# Patient Record
Sex: Male | Born: 1950 | Race: White | Hispanic: No | Marital: Married | Smoking: Former smoker
Health system: Southern US, Community
[De-identification: ages and names within clinical notes are randomized; demographics above are authoritative.]

## PROBLEM LIST (undated history)

## (undated) DIAGNOSIS — K5903 Drug induced constipation: Secondary | ICD-10-CM

## (undated) DIAGNOSIS — G062 Extradural and subdural abscess, unspecified: Secondary | ICD-10-CM

## (undated) DIAGNOSIS — R030 Elevated blood-pressure reading, without diagnosis of hypertension: Principal | ICD-10-CM

## (undated) DIAGNOSIS — M4640 Discitis, unspecified, site unspecified: Secondary | ICD-10-CM

## (undated) DIAGNOSIS — F32A Depression, unspecified: Secondary | ICD-10-CM

## (undated) DIAGNOSIS — M25511 Pain in right shoulder: Secondary | ICD-10-CM

## (undated) DIAGNOSIS — R0789 Other chest pain: Secondary | ICD-10-CM

## (undated) DIAGNOSIS — F329 Major depressive disorder, single episode, unspecified: Secondary | ICD-10-CM

## (undated) DIAGNOSIS — M25512 Pain in left shoulder: Secondary | ICD-10-CM

## (undated) DIAGNOSIS — F1010 Alcohol abuse, uncomplicated: Secondary | ICD-10-CM

## (undated) DIAGNOSIS — D696 Thrombocytopenia, unspecified: Secondary | ICD-10-CM

## (undated) DIAGNOSIS — I10 Essential (primary) hypertension: Secondary | ICD-10-CM

## (undated) DIAGNOSIS — K7290 Hepatic failure, unspecified without coma: Secondary | ICD-10-CM

## (undated) DIAGNOSIS — F419 Anxiety disorder, unspecified: Secondary | ICD-10-CM

## (undated) DIAGNOSIS — IMO0001 Reserved for inherently not codable concepts without codable children: Secondary | ICD-10-CM

## (undated) DIAGNOSIS — J449 Chronic obstructive pulmonary disease, unspecified: Secondary | ICD-10-CM

## (undated) DIAGNOSIS — K7460 Unspecified cirrhosis of liver: Secondary | ICD-10-CM

## (undated) DIAGNOSIS — M869 Osteomyelitis, unspecified: Secondary | ICD-10-CM

## (undated) DIAGNOSIS — Z8619 Personal history of other infectious and parasitic diseases: Secondary | ICD-10-CM

## (undated) DIAGNOSIS — B1920 Unspecified viral hepatitis C without hepatic coma: Secondary | ICD-10-CM

## (undated) DIAGNOSIS — M009 Pyogenic arthritis, unspecified: Secondary | ICD-10-CM

## (undated) DIAGNOSIS — D649 Anemia, unspecified: Secondary | ICD-10-CM

## (undated) DIAGNOSIS — R7881 Bacteremia: Secondary | ICD-10-CM

## (undated) HISTORY — DX: Extradural and subdural abscess, unspecified: G06.2

## (undated) HISTORY — DX: Drug induced constipation: K59.03

## (undated) HISTORY — DX: Thrombocytopenia, unspecified: D69.6

## (undated) HISTORY — DX: Pyogenic arthritis, unspecified: M00.9

## (undated) HISTORY — DX: Personal history of other infectious and parasitic diseases: Z86.19

## (undated) HISTORY — DX: Depression, unspecified: F32.A

## (undated) HISTORY — DX: Anemia, unspecified: D64.9

## (undated) HISTORY — DX: Osteomyelitis, unspecified: M86.9

## (undated) HISTORY — DX: Pain in left shoulder: M25.512

## (undated) HISTORY — DX: Discitis, unspecified, site unspecified: M46.40

## (undated) HISTORY — DX: Other chest pain: R07.89

## (undated) HISTORY — DX: Elevated blood-pressure reading, without diagnosis of hypertension: R03.0

## (undated) HISTORY — DX: Unspecified cirrhosis of liver: K74.60

## (undated) HISTORY — DX: Anxiety disorder, unspecified: F41.9

## (undated) HISTORY — DX: Reserved for inherently not codable concepts without codable children: IMO0001

## (undated) HISTORY — DX: Bacteremia: R78.81

## (undated) HISTORY — DX: Pain in right shoulder: M25.511

## (undated) HISTORY — DX: Alcohol abuse, uncomplicated: F10.10

## (undated) HISTORY — DX: Unspecified viral hepatitis C without hepatic coma: B19.20

## (undated) HISTORY — DX: Major depressive disorder, single episode, unspecified: F32.9

## (undated) HISTORY — DX: Hepatic failure, unspecified without coma: K72.90

---

## 2010-02-12 DIAGNOSIS — R7881 Bacteremia: Secondary | ICD-10-CM

## 2010-02-12 HISTORY — DX: Bacteremia: R78.81

## 2010-03-06 ENCOUNTER — Emergency Department (HOSPITAL_COMMUNITY): Admission: EM | Admit: 2010-03-06 | Discharge: 2010-03-07 | Payer: Self-pay | Admitting: Emergency Medicine

## 2010-03-07 ENCOUNTER — Ambulatory Visit: Payer: Self-pay | Admitting: Internal Medicine

## 2010-03-07 ENCOUNTER — Inpatient Hospital Stay (HOSPITAL_COMMUNITY): Admission: EM | Admit: 2010-03-07 | Discharge: 2010-03-30 | Payer: Self-pay | Admitting: Emergency Medicine

## 2010-03-07 ENCOUNTER — Ambulatory Visit: Payer: Self-pay | Admitting: Cardiovascular Disease

## 2010-03-07 DIAGNOSIS — R7881 Bacteremia: Secondary | ICD-10-CM | POA: Insufficient documentation

## 2010-03-08 ENCOUNTER — Encounter
Admission: RE | Admit: 2010-03-08 | Discharge: 2010-03-10 | Payer: Self-pay | Source: Home / Self Care | Admitting: Internal Medicine

## 2010-03-10 ENCOUNTER — Encounter: Payer: Self-pay | Admitting: Infectious Disease

## 2010-03-12 ENCOUNTER — Encounter: Payer: Self-pay | Admitting: Internal Medicine

## 2010-03-14 ENCOUNTER — Encounter: Payer: Self-pay | Admitting: Internal Medicine

## 2010-03-15 ENCOUNTER — Encounter: Payer: Self-pay | Admitting: Internal Medicine

## 2010-03-15 DIAGNOSIS — K7290 Hepatic failure, unspecified without coma: Secondary | ICD-10-CM

## 2010-03-15 DIAGNOSIS — K7682 Hepatic encephalopathy: Secondary | ICD-10-CM

## 2010-03-15 HISTORY — DX: Hepatic failure, unspecified without coma: K72.90

## 2010-03-15 HISTORY — DX: Hepatic encephalopathy: K76.82

## 2010-03-21 ENCOUNTER — Encounter: Payer: Self-pay | Admitting: Internal Medicine

## 2010-03-23 ENCOUNTER — Encounter: Payer: Self-pay | Admitting: Internal Medicine

## 2010-03-23 DIAGNOSIS — F1021 Alcohol dependence, in remission: Secondary | ICD-10-CM

## 2010-03-23 DIAGNOSIS — K7460 Unspecified cirrhosis of liver: Secondary | ICD-10-CM | POA: Insufficient documentation

## 2010-03-23 DIAGNOSIS — B182 Chronic viral hepatitis C: Secondary | ICD-10-CM | POA: Insufficient documentation

## 2010-03-23 DIAGNOSIS — E8779 Other fluid overload: Secondary | ICD-10-CM | POA: Insufficient documentation

## 2010-03-23 DIAGNOSIS — J690 Pneumonitis due to inhalation of food and vomit: Secondary | ICD-10-CM | POA: Insufficient documentation

## 2010-03-23 DIAGNOSIS — Z87448 Personal history of other diseases of urinary system: Secondary | ICD-10-CM

## 2010-03-23 DIAGNOSIS — J309 Allergic rhinitis, unspecified: Secondary | ICD-10-CM | POA: Insufficient documentation

## 2010-03-23 DIAGNOSIS — K7290 Hepatic failure, unspecified without coma: Secondary | ICD-10-CM | POA: Insufficient documentation

## 2010-03-23 DIAGNOSIS — D638 Anemia in other chronic diseases classified elsewhere: Secondary | ICD-10-CM | POA: Insufficient documentation

## 2010-03-23 DIAGNOSIS — K5909 Other constipation: Secondary | ICD-10-CM | POA: Insufficient documentation

## 2010-04-07 ENCOUNTER — Encounter: Payer: Self-pay | Admitting: Internal Medicine

## 2010-04-13 ENCOUNTER — Telehealth (INDEPENDENT_AMBULATORY_CARE_PROVIDER_SITE_OTHER): Payer: Self-pay | Admitting: *Deleted

## 2010-04-18 ENCOUNTER — Ambulatory Visit: Payer: Self-pay | Admitting: Internal Medicine

## 2010-04-18 DIAGNOSIS — R030 Elevated blood-pressure reading, without diagnosis of hypertension: Secondary | ICD-10-CM | POA: Insufficient documentation

## 2010-04-18 DIAGNOSIS — Z862 Personal history of diseases of the blood and blood-forming organs and certain disorders involving the immune mechanism: Secondary | ICD-10-CM | POA: Insufficient documentation

## 2010-04-18 DIAGNOSIS — M25519 Pain in unspecified shoulder: Secondary | ICD-10-CM | POA: Insufficient documentation

## 2010-04-18 DIAGNOSIS — M2550 Pain in unspecified joint: Secondary | ICD-10-CM | POA: Insufficient documentation

## 2010-04-18 LAB — CONVERTED CEMR LAB
ALT: 30 units/L (ref 0–53)
Albumin: 3.2 g/dL — ABNORMAL LOW (ref 3.5–5.2)
Alkaline Phosphatase: 33 units/L — ABNORMAL LOW (ref 39–117)
Basophils Absolute: 0 10*3/uL (ref 0.0–0.1)
CO2: 21 meq/L (ref 19–32)
Calcium: 9 mg/dL (ref 8.4–10.5)
Chloride: 105 meq/L (ref 96–112)
Creatinine, Ser: 0.84 mg/dL (ref 0.40–1.50)
Eosinophils Absolute: 0 10*3/uL (ref 0.0–0.7)
Glucose, Bld: 115 mg/dL — ABNORMAL HIGH (ref 70–99)
HCT: 33.1 % — ABNORMAL LOW (ref 39.0–52.0)
Hemoglobin: 11.2 g/dL — ABNORMAL LOW (ref 13.0–17.0)
MCV: 90.4 fL (ref >=78.0)
Monocytes Relative: 8 % (ref 3–12)
Platelets: 132 10*3/uL — ABNORMAL LOW (ref 150–400)
Potassium: 3.7 meq/L (ref 3.5–5.3)
RDW: 14.7 % (ref 11.5–15.5)
Sodium: 137 meq/L (ref 135–145)
WBC: 7.5 10*3/uL (ref 4.0–10.5)

## 2010-04-19 ENCOUNTER — Telehealth: Payer: Self-pay | Admitting: *Deleted

## 2010-04-20 ENCOUNTER — Telehealth: Payer: Self-pay | Admitting: *Deleted

## 2010-04-24 ENCOUNTER — Ambulatory Visit: Payer: Self-pay | Admitting: Internal Medicine

## 2010-04-25 LAB — CONVERTED CEMR LAB
Creatinine,U: 240.7 mg/dL
Methadone: NEGATIVE
Opiates: POSITIVE — AB
Phencyclidine (PCP): NEGATIVE
Propoxyphene: NEGATIVE

## 2010-04-27 ENCOUNTER — Inpatient Hospital Stay (HOSPITAL_COMMUNITY): Admission: EM | Admit: 2010-04-27 | Discharge: 2010-05-18 | Payer: Self-pay | Admitting: Emergency Medicine

## 2010-04-27 ENCOUNTER — Encounter: Payer: Self-pay | Admitting: Ophthalmology

## 2010-04-27 ENCOUNTER — Ambulatory Visit: Payer: Self-pay | Admitting: Internal Medicine

## 2010-04-27 DIAGNOSIS — G062 Extradural and subdural abscess, unspecified: Secondary | ICD-10-CM | POA: Insufficient documentation

## 2010-04-27 DIAGNOSIS — M519 Unspecified thoracic, thoracolumbar and lumbosacral intervertebral disc disorder: Secondary | ICD-10-CM

## 2010-04-28 ENCOUNTER — Ambulatory Visit: Payer: Self-pay | Admitting: Infectious Diseases

## 2010-05-02 ENCOUNTER — Encounter: Payer: Self-pay | Admitting: Internal Medicine

## 2010-05-15 DIAGNOSIS — A0472 Enterocolitis due to Clostridium difficile, not specified as recurrent: Secondary | ICD-10-CM

## 2010-05-18 ENCOUNTER — Encounter: Payer: Self-pay | Admitting: Internal Medicine

## 2010-05-30 ENCOUNTER — Encounter: Payer: Self-pay | Admitting: Internal Medicine

## 2010-05-31 ENCOUNTER — Ambulatory Visit: Payer: Self-pay | Admitting: Internal Medicine

## 2010-05-31 DIAGNOSIS — R55 Syncope and collapse: Secondary | ICD-10-CM

## 2010-05-31 DIAGNOSIS — F411 Generalized anxiety disorder: Secondary | ICD-10-CM | POA: Insufficient documentation

## 2010-06-16 ENCOUNTER — Encounter: Payer: Self-pay | Admitting: Internal Medicine

## 2010-06-16 ENCOUNTER — Telehealth: Payer: Self-pay | Admitting: Internal Medicine

## 2010-07-06 ENCOUNTER — Telehealth: Payer: Self-pay | Admitting: Internal Medicine

## 2010-07-06 ENCOUNTER — Encounter: Payer: Self-pay | Admitting: Internal Medicine

## 2010-07-12 ENCOUNTER — Ambulatory Visit: Payer: Self-pay | Admitting: Internal Medicine

## 2010-07-13 LAB — CONVERTED CEMR LAB
Amphetamine Screen, Ur: NEGATIVE
Barbiturate Quant, Ur: NEGATIVE
Creatinine,U: 201.5 mg/dL
Opiates: POSITIVE — AB
Phencyclidine (PCP): NEGATIVE
Propoxyphene: NEGATIVE

## 2010-07-17 ENCOUNTER — Encounter: Payer: Self-pay | Admitting: Internal Medicine

## 2010-07-17 ENCOUNTER — Telehealth: Payer: Self-pay | Admitting: *Deleted

## 2010-07-20 ENCOUNTER — Encounter: Payer: Self-pay | Admitting: Infectious Disease

## 2010-07-20 ENCOUNTER — Ambulatory Visit: Payer: Self-pay | Admitting: Internal Medicine

## 2010-07-20 ENCOUNTER — Encounter: Payer: Self-pay | Admitting: Internal Medicine

## 2010-07-20 ENCOUNTER — Telehealth: Payer: Self-pay | Admitting: *Deleted

## 2010-07-20 LAB — CONVERTED CEMR LAB
CO2: 28 meq/L (ref 19–32)
Chloride: 103 meq/L (ref 96–112)
Creatinine, Ser: 0.79 mg/dL (ref 0.40–1.50)
Glucose, Bld: 96 mg/dL (ref 70–99)
Sodium: 140 meq/L (ref 135–145)

## 2010-07-25 ENCOUNTER — Ambulatory Visit (HOSPITAL_COMMUNITY): Admission: RE | Admit: 2010-07-25 | Discharge: 2010-07-25 | Payer: Self-pay | Admitting: Internal Medicine

## 2010-07-31 ENCOUNTER — Telehealth: Payer: Self-pay | Admitting: Infectious Disease

## 2010-07-31 DIAGNOSIS — M009 Pyogenic arthritis, unspecified: Secondary | ICD-10-CM | POA: Insufficient documentation

## 2010-08-03 ENCOUNTER — Telehealth: Payer: Self-pay | Admitting: Internal Medicine

## 2010-08-04 ENCOUNTER — Ambulatory Visit (HOSPITAL_COMMUNITY): Admission: RE | Admit: 2010-08-04 | Discharge: 2010-08-04 | Payer: Self-pay | Admitting: Infectious Disease

## 2010-08-07 ENCOUNTER — Telehealth (INDEPENDENT_AMBULATORY_CARE_PROVIDER_SITE_OTHER): Payer: Self-pay | Admitting: *Deleted

## 2010-08-09 ENCOUNTER — Telehealth: Payer: Self-pay | Admitting: Infectious Disease

## 2010-08-10 ENCOUNTER — Ambulatory Visit: Payer: Self-pay | Admitting: Internal Medicine

## 2010-08-10 ENCOUNTER — Encounter: Payer: Self-pay | Admitting: Internal Medicine

## 2010-08-10 ENCOUNTER — Inpatient Hospital Stay (HOSPITAL_COMMUNITY): Admission: EM | Admit: 2010-08-10 | Discharge: 2010-08-13 | Payer: Self-pay | Admitting: Emergency Medicine

## 2010-08-11 ENCOUNTER — Ambulatory Visit: Payer: Self-pay | Admitting: Infectious Diseases

## 2010-08-13 ENCOUNTER — Encounter: Payer: Self-pay | Admitting: Internal Medicine

## 2010-08-15 ENCOUNTER — Encounter: Payer: Self-pay | Admitting: Infectious Disease

## 2010-08-16 ENCOUNTER — Telehealth: Payer: Self-pay | Admitting: Internal Medicine

## 2010-08-21 ENCOUNTER — Encounter: Payer: Self-pay | Admitting: Internal Medicine

## 2010-08-23 ENCOUNTER — Telehealth: Payer: Self-pay | Admitting: Internal Medicine

## 2010-08-23 ENCOUNTER — Encounter: Payer: Self-pay | Admitting: Internal Medicine

## 2010-08-24 ENCOUNTER — Ambulatory Visit: Payer: Self-pay | Admitting: Infectious Diseases

## 2010-08-24 ENCOUNTER — Encounter: Payer: Self-pay | Admitting: Internal Medicine

## 2010-08-24 ENCOUNTER — Inpatient Hospital Stay (HOSPITAL_COMMUNITY): Admission: EM | Admit: 2010-08-24 | Discharge: 2010-08-25 | Payer: Self-pay | Admitting: Emergency Medicine

## 2010-09-04 ENCOUNTER — Encounter: Payer: Self-pay | Admitting: Internal Medicine

## 2010-09-05 ENCOUNTER — Encounter: Payer: Self-pay | Admitting: Infectious Disease

## 2010-09-06 ENCOUNTER — Ambulatory Visit: Payer: Self-pay | Admitting: Infectious Disease

## 2010-09-11 ENCOUNTER — Telehealth: Payer: Self-pay | Admitting: Internal Medicine

## 2010-09-14 ENCOUNTER — Ambulatory Visit: Payer: Self-pay | Admitting: Internal Medicine

## 2010-09-14 DIAGNOSIS — F329 Major depressive disorder, single episode, unspecified: Secondary | ICD-10-CM

## 2010-09-15 ENCOUNTER — Encounter: Payer: Self-pay | Admitting: Internal Medicine

## 2010-09-29 ENCOUNTER — Encounter: Payer: Self-pay | Admitting: Infectious Diseases

## 2010-10-04 ENCOUNTER — Encounter: Payer: Self-pay | Admitting: Infectious Diseases

## 2010-10-10 ENCOUNTER — Encounter: Payer: Self-pay | Admitting: Internal Medicine

## 2010-10-10 ENCOUNTER — Telehealth: Payer: Self-pay | Admitting: Internal Medicine

## 2010-10-18 ENCOUNTER — Ambulatory Visit: Admit: 2010-10-18 | Payer: Self-pay

## 2010-10-27 ENCOUNTER — Encounter: Payer: Self-pay | Admitting: Infectious Disease

## 2010-11-07 ENCOUNTER — Telehealth: Payer: Self-pay | Admitting: Internal Medicine

## 2010-11-10 ENCOUNTER — Telehealth: Payer: Self-pay | Admitting: Internal Medicine

## 2010-11-13 ENCOUNTER — Telehealth: Payer: Self-pay | Admitting: Internal Medicine

## 2010-11-13 ENCOUNTER — Encounter: Payer: Self-pay | Admitting: Infectious Disease

## 2010-11-13 ENCOUNTER — Ambulatory Visit
Admission: RE | Admit: 2010-11-13 | Discharge: 2010-11-13 | Payer: Self-pay | Source: Home / Self Care | Attending: Infectious Disease | Admitting: Infectious Disease

## 2010-11-13 LAB — CONVERTED CEMR LAB
AST: 68 units/L — ABNORMAL HIGH (ref 0–37)
Albumin: 3.2 g/dL — ABNORMAL LOW (ref 3.5–5.2)
Alkaline Phosphatase: 29 units/L — ABNORMAL LOW (ref 39–117)
BUN: 7 mg/dL (ref 6–23)
Basophils Absolute: 0 10*3/uL (ref 0.0–0.1)
Basophils Relative: 1 % (ref 0–1)
Creatinine, Ser: 0.67 mg/dL (ref 0.40–1.50)
Eosinophils Absolute: 0.1 10*3/uL (ref 0.0–0.7)
Eosinophils Relative: 2 % (ref 0–5)
HCT: 34.3 % — ABNORMAL LOW (ref 39.0–52.0)
Hemoglobin: 11.4 g/dL — ABNORMAL LOW (ref 13.0–17.0)
MCHC: 33.2 g/dL (ref 30.0–36.0)
MCV: 91.2 fL (ref 78.0–100.0)
Monocytes Absolute: 0.3 10*3/uL (ref 0.1–1.0)
Monocytes Relative: 8 % (ref 3–12)
Potassium: 3.4 meq/L — ABNORMAL LOW (ref 3.5–5.3)
RDW: 15.3 % (ref 11.5–15.5)
Total Bilirubin: 1.3 mg/dL — ABNORMAL HIGH (ref 0.3–1.2)

## 2010-11-14 NOTE — Medication Information (Signed)
Summary: Alex Henry   Imported By: Margie Billet 07/11/2010 11:25:54  _____________________________________________________________________  External Attachment:    Type:   Image     Comment:   External Document

## 2010-11-14 NOTE — Progress Notes (Signed)
Summary: Refill/gh  Phone Note Refill Request Message from:  Fax from Pharmacy on April 13, 2010 12:15 PM  Refills Requested: Medication #1:  HYDROCODONE-ACETAMINOPHEN 5-500 MG TABS Take one tablet by mouth every 6 hours as needed for pain   Last Refilled: 03/30/2010 Appointment scheduled for 04/18/2010.  Hospital discharge 03/30/2010   Method Requested: Electronic Initial call taken by: Angelina Ok RN,  April 13, 2010 12:15 PM  Follow-up for Phone Call        This is a new patient for hospital follow-up. No refills until he is seen in our clinic. His infection should be imporoving and also his pain. Caution with pain meds in a liver patient. Will have to be seen first. Can wait until Tuesday. Follow-up by: Julaine Fusi  DO,  April 13, 2010 3:48 PM  Additional Follow-up for Phone Call Additional follow up Details #1::        Rx denial called/faxed to pharmacy.  Pharmacy to inform pt of denial. Additional Follow-up by: Angelina Ok RN,  April 14, 2010 11:05 AM

## 2010-11-14 NOTE — Miscellaneous (Signed)
Summary: Orders Update  Clinical Lists Changes  Orders: Added new Test order of MRI with Contrast (MRI w/Contrast) - Signed Added new Test order of T-CBC w/Diff ([id number]) - Signed Added new Test order of T-CMP with estimated GFR ([id number]) - Signed Added new Test order of T-C-Reactive Protein [phone]) - Signed Added new Test order of T-Sed Rate (Automated) ([id number]) - Signed

## 2010-11-14 NOTE — Progress Notes (Signed)
Summary: REfill/gh  Phone Note Refill Request Message from:  Patient on June 16, 2010 10:53 AM  Refills Requested: Medication #1:  PANTOPRAZOLE SODIUM 40 MG TBEC Take 1 tablet by mouth two times a day  Medication #2:  CLARITIN 10 MG TABS Take 1 tablet by mouth once a day PRN  Method Requested: Electronic Initial call taken by: Angelina Ok RN,  June 16, 2010 10:53 AM    Prescriptions: PANTOPRAZOLE SODIUM 40 MG TBEC (PANTOPRAZOLE SODIUM) Take 1 tablet by mouth two times a day  #60 x 11   Entered and Authorized by:   Deatra Robinson MD   Signed by:   Deatra Robinson MD on 06/16/2010   Method used:   Faxed to ...       [facility] Department (retail)       [address] [place], [place]  [address]       Ph: [phone]       Fax: [phone]   RxID:   [id number] CLARITIN 10 MG TABS (LORATADINE) Take 1 tablet by mouth once a day PRN  #30 x 11   Entered and Authorized by:   Deatra Robinson MD   Signed by:   Deatra Robinson MD on 06/16/2010   Method used:   Faxed to ...       [facility] Department (retail)       [address] [place], [place]  [address]       Ph: [phone]       Fax: [phone]   RxID:   [id number]

## 2010-11-14 NOTE — Miscellaneous (Signed)
Summary: [facility]  [facility]   Imported By: Shon Hough 05/04/2010 15:09:18  _____________________________________________________________________  External Attachment:    Type:   Image     Comment:   External Document

## 2010-11-14 NOTE — Progress Notes (Signed)
Summary: call for hep c clinic appt/ hla  Phone Note Other Incoming Call back at [phone] (sister)   Caller: pt's sister Summary of Call: pt's sister, dorothy francis calls to f/u on referral to hep c clinic, her # [phone], tried to transfer call to clinic nurse, not available, spoke in more depth w/ sister and checked HIPPA, sister is not listed, per sister the person listed is pt's exgirlfriend, i explained i was sorry but the pt would need to call for information and if he chose to have her listed as his contact person he would need to do this at next visit or come in and change this information. she began to cry and stated that they were not told when he came for his appt 7/5 and that they both had told the person at the front that she would be taking care of him and would be speaking w/ the md and that person did not tell them he needed to make changes to his chart, i apologized and told her that the pt usually initiates these changes and i was sure he just forgot since he was feeling so bad and this could be remedied by him coming in, but for now he would need to call for himself and maybe she could be w/ him when he called to make sure he wrote times and dates down correctly. she continued to cry, i reassurred her and told her to speak w/ her brother and it would be alright. her # [phone] Initial call taken by: Marin Roberts RN,  April 19, 2010 5:36 PM  Follow-up for Phone Call        Pt's sister contacted and HIPAA policy clarified. Pt signed HIPAA consent in hospital which states family members involved in pt's care can be involved in discussions with clinic staff unless a specific exclusion is cited. Pt's sister states she called for f/u appt with Dr. Elnoria Howard as instructed and was told pt would have to pay for appt, and he has no insurance and no money to pay. Dr. Denton Meek informed of situation and states she will call Dr. Elnoria Howard personally tomorrow. In the interim, pt's sister instructed that pt  must complete financial asst application with Chauncey Reading (she has list of info needed and Reece Levy' s office phone #) ASAP for any further referrals and to see if he qualifies for asst with clinic charges. She verbalizes understanding of these instructions. Follow-up by: Dorie Rank RN,  April 19, 2010 6:14 PM  Additional Follow-up for Phone Call Additional follow up Details #1::        Clois Dupes, I spoke with Dr. Elnoria Howard. The patient's Hgb is stable . Terefore no a repeat colonoscopy is needed at this time. Also, we have to arrange a referral to  Hep C [facility] clinic. No need to see Dr. Elnoria Howard for now. Please, call the patient. Thank you. Additional Follow-up by: Deatra Robinson MD,  April 20, 2010 8:44 AM    Additional Follow-up for Phone Call Additional follow up Details #2::    Called sister and left message to call clinic back for follow up. Dorie Rank RN  April 20, 2010 11:50 AM' Sister Jerold Coombe given the information documented by Dr. Denton Meek in this note. Sister reminded that pt must see Chauncey Reading to be able to refer to Hep C clinic. Sister had questions about proof of income and instructed to call Chauncey Reading to clarify to be sure she  and pt bring needed info when they do come in; she thought they could come in Monday, July 11. Sister instructed that Chauncey Reading would notify RN when application completed and Hep C application would then be initiated, to expect a wait for an appt since the Hep C clinic wait list has been at least a month long recently. Sister verbalizes understanding of these instructions.  Follow-up by: Dorie Rank RN,  April 20, 2010 1:25 PM

## 2010-11-14 NOTE — Progress Notes (Signed)
Summary: Care Plan Oversight  Phone Note Outgoing Call   Call placed by: Acey Lav MD,  July 31, 2010 9:19 AM Details for Reason: Care Plan Oversight Summary of Call: [phone](> 60 mins) I have supervised home care and/or infusion therapy for this pt, including providing orders for care, review of labs and/or home health care plans, communicating with the home health care professionals and/or patient/caregivers to integrate current information into the medical treatment plan and/or adjust the medical therapy. This supervision has been provided for 62___minutes during the calendar month. Dates for this oversight 08/13/10 thru 09/12/10 Treating septic arthritis with streptococcal species   Initial call taken by: Acey Lav MD,  July 31, 2010 9:20 AM

## 2010-11-14 NOTE — Assessment & Plan Note (Signed)
Summary: F/U Anxiety, body pain. Needs flu shot.   Vital Signs:  Patient profile:   60 year old male Height:      72 inches (182.88 cm) Weight:      179.9 pounds (81.77 kg) BMI:     24.49 Temp:     97.7 degrees F (36.50 degrees C) oral Pulse rate:   87 / minute BP sitting:   110 / 74  (left arm) Cuff size:   regular  Vitals Entered By: Theotis Barrio NT II (July 12, 2010 11:05 AM) CC: Medication Refill, pain throughout body, anxiety. Is Patient Diabetic? No Pain Assessment Patient in pain? yes     Location: ALL OVER Intensity:     8 Type: STABBING Onset of pain  FOR ABOUT 3 MONTHS +  Have you ever been in a relationship where you felt threatened, hurt or afraid?No   Does patient need assistance? Functional Status Self care Ambulation Normal   Primary Care Provider:  Deatra Robinson MD  CC:  Medication Refill, pain throughout body, and anxiety.Marland Kitchen  History of Present Illness: Pt is a 60 yo M with PMHx of Anxiety disorder, hepatic encephalopathy who presents to clinic today with multiple medical concerns, including the following:   1) Anxiety - requesting refill of Xanax. States that despite Xanax, he still is feeling anxious "several" times a day, with shakiness, nervousness, and worrying a lot, that are provoked by stressful thoughts. Confirms difficulty staying asleep (unknown what awakens him), sleeps 5 hours nightly on a good night.  Denies depressed mood, isolation, anhedonia, but does have occasional feelings of guilt about not having a job. Last visit was increased to Xanax 1mg  QID from three times a day, without improvement of pain. No alcohol x 4 months, no smoking x 7 years, no current drug use.  2) Chronic pain - Pain started 05/11 after he fell onto his left side in [employer] onto a concrete floor. Since that time he has had diffuse pain throughout whole body, especially joints, which has been stable and is not progressing in intensity. Described as a 6-7/10  sharp/stabbing, constant pain.  Pain provoked by activity. Pain better with pain meds (Percocet 7.5-325 mg q6hr prn) . Not better with icing or heating pads. No numbness, tingling, weakness of legs.   Of note, from 07/14-08/04/11, Mr. 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 At that time, his presentation was pain shooting down his arms. He was treated with a 6week course of antibiotics, which he successfully completed. Today, pt states the pain in his arms may be similar to previous episode, but he is unsure. He also does confirm pain and tenderness over cervical spine.  No fevers, chills, increased confusion, no weakness, no numbness, no tingling. No new falls or trauma.      Preventive Screening-Counseling & Management  Alcohol-Tobacco     Alcohol drinks/day: 0 x 3 months     Smoking Status: quit > 6 months     Year Quit: 55yrs ago  Caffeine-Diet-Exercise     Does Patient Exercise: yes     Type of exercise: WALKING     Exercise (avg: min/session): WHAT EVER HE CAN  Current Medications (verified): 1)  Prilosec Otc 20 Mg Tbec (Omeprazole Magnesium) .... Take One To Two Tablets By Mouth Daily 2)  Docu Soft 100 Mg Caps (Docusate Sodium) .... Take 1 Tablet By Mouth Two Times A Day By Mouth As Needed 3)  Alprazolam 1 Mg Tabs (Alprazolam) .Marland KitchenMarland KitchenMarland Kitchen  Take 1 Tablet By Mouth Four Times A Day As Needed For Anxiety 4)  Oxycodone-Acetaminophen 7.5-325 Mg Tabs (Oxycodone-Acetaminophen) .... Take One Tablet By Mouth Q 6 Hours As Needed For Pain For 2 Weeks.  Allergies: 1)  ! Codeine 2)  ! Rocephin (Ceftriaxone Sodium)  Social History: Does Patient Exercise:  yes  Review of Systems       per HPI  Physical Exam  General:  Well-developed,well-nourished,in no acute distress; alert,  and cooperative throughout examination Head:  Normocephalic and atraumatic without obvious abnormalities.  Eyes:  pupils constricted. Neck:  TTP over lower cervical spine,  without obvious deformities, no warmth, erythema, lesions appreciated.  Lungs:  Normal respiratory effort, chest expands symmetrically. Lungs are clear to auscultation, no crackles or wheezes. Heart:  Normal rate and regular rhythm. S1 and S2 normal without gallop, murmur, click, rub or other extra sounds. Abdomen:  Bowel sounds positive,abdomen soft and non-tender  Msk:  No joint swelling, no joint warmth, no redness over joints, and no joint instability, no tenderness to palpation over joints.  Pulses:  R and L dorsalis pedis pulses are full and equal bilaterally Extremities:  no edema Neurologic:  muscle strength 5/5 all 4 extremities   Detailed Back/Spine Exam  General:    tenderness to palpation over cervical spine, and midthoracic spine.  Inspection:    no apparent deformity appreciated. No areas of ecchymosis or swelling appreciated.    Impression & Recommendations:  Problem # 1:  Hx of ABSCESS, EPIDURAL (ICD-324.9) Pt had recent hospitalization for epidural abscess with diskitis and osteomyelitis of C6-C7. Today he is complaining of pain in a generalized distribution over his body, but arm pain that may be equivocal to the pain that he previously experienced when he had the epidural abscess, diskitis, and OM at C6-C7. Therefore, further imaging is warranted given that symptoms may be equivocal to prior symptoms despite adequate antibiotic therapy.  - MRI with contrast of cervical spine  Orders: MRI with Contrast (MRI w/Contrast)  Problem # 2:  ANXIETY DISORDER, GENERALIZED (ICD-300.02) Assessment: Unchanged Xanax QID was not adequately controlling pt's anxiety symptoms, we want to ensure pt is regularly taking the Xanax, because the dosage and frequency he was taking were substantial without effect. - Will check UDS - to ensure he is taking the Xanax - Will stop Xanax - Will start Lorazepam 1 mg two times a day as has a slightly longer duration of action. - Can consider  adding an SSRI in future  The following medications were removed from the medication list:    Alprazolam 1 Mg Tabs (Alprazolam) .Marland Kitchen... Take 1 tablet by mouth four times a day as needed for anxiety His updated medication list for this problem includes:    Lorazepam 1 Mg Tabs (Lorazepam) .Marland Kitchen... Take 1 tablet by mouth two times a day as needed for anxiety  Orders: T-Drug Screen-Urine, (single) [phone])  Problem # 3:  PAIN IN JOINT, MULTIPLE SITES (ICD-719.49) Patient's pain seems to be worse at joints, but not only localized to joints.  - Will STOP Percocet - in setting of hepatic impairment with recent hepatic encephalopathy - Will start Oxycodone 5mg  1-2 mg q6 (#120 given) - Will start Neurontin three times a day to help with any neuropathic pain derived from nerve impingement from known disc disease. - Will get MRI to reevaluate cervical spine.  Complete Medication List: 1)  Prilosec Otc 20 Mg Tbec (Omeprazole magnesium) .... Take one to two tablets by mouth daily 2)  Docu Soft 100 Mg Caps (Docusate sodium) .... Take 1 tablet by mouth two times a day by mouth as needed 3)  Oxycodone-acetaminophen 7.5-325 Mg Tabs (Oxycodone-acetaminophen) .... Take one tablet by mouth q 6 hours as needed for pain for 2 weeks. 4)  Neurontin 600 Mg Tabs (Gabapentin) .... Take 1 tablet by mouth three times a day 5)  Oxycodone Hcl 5 Mg Tabs (Oxycodone hcl) .Marland Kitchen.. 1-2 tabs every 6 hours as needed for pain 6)  Lorazepam 1 Mg Tabs (Lorazepam) .... Take 1 tablet by mouth two times a day as needed for anxiety  Other Orders: Influenza Vaccine NON MCR (16109)  Patient Instructions: 1)  Please follow-up at the clinic in 2-3 weeks at which time we will reevaluate your pain and anxiety. 2)  You have been started on Gabapentin for pain, if you develop thoughts of suicide, fractures, dizziness, please stop the medication and call the clinic at [phone]. 3)  STOP XANAX (Alprazolam) 4)  STOP Percocet (Oxycodone/  Acetominophen) 5)  Start Lorazepam twice daily for your anxiety - if you devleop difficulty breathing, rash, seizures, fast heart beat, please stop the medication and call the clinic at [phone]. 6)  Start Oxycodone 5mg  for your pain. Do not operate heavy machinery or drive if you get sleepiness with this medication. 7)  Please bring all of your medications in a bag to your next visit. Prescriptions: LORAZEPAM 1 MG TABS (LORAZEPAM) Take 1 tablet by mouth two times a day as needed for anxiety  #60 x 3   Entered and Authorized by:   Johnette Abraham DO   Signed by:   Johnette Abraham DO on 07/12/2010   Method used:   Print then Give to Patient   RxID:   [id number] OXYCODONE HCL 5 MG TABS (OXYCODONE HCL) 1-2 tabs every 6 hours as needed for pain  #120 x 0   Entered and Authorized by:   Johnette Abraham DO   Signed by:   Johnette Abraham DO on 07/12/2010   Method used:   Print then Give to Patient   RxID:   [id number] NEURONTIN 600 MG TABS (GABAPENTIN) Take 1 tablet by mouth three times a day  #90 x 3   Entered and Authorized by:   Johnette Abraham DO   Signed by:   Johnette Abraham DO on 07/12/2010   Method used:   Print then Give to Patient   RxID:   [id number]  Process Orders Check Orders Results:     [facility]: ABN not required for this insurance Tests Sent for requisitioning (July 13, 2010 7:49 AM):     07/12/2010: [facility] -- T-Drug Screen-Urine, (single) [id number] (signed)     Prevention & Chronic Care Immunizations   Influenza vaccine: Fluvax Non-MCR  (07/12/2010)   Influenza vaccine deferral: Deferred  (04/18/2010)   Influenza vaccine due: 06/15/2010    Tetanus booster: Not documented   Tetanus booster due: 04/18/2020    Pneumococcal vaccine: Not documented   Pneumococcal vaccine deferral: Deferred  (04/18/2010)   Pneumococcal vaccine due: 04/19/2015    H. zoster vaccine: Not  documented  Colorectal Screening   Hemoccult: Not documented   Hemoccult action/deferral: Deferred  (04/18/2010)   Hemoccult due: 04/19/2011    Colonoscopy: Not documented   Colonoscopy action/deferral: GI referral  (04/18/2010)  Other Screening   PSA: Not documented   PSA action/deferral: Discussed-PSA declined  (04/18/2010)   PSA due due: 04/19/2011   Smoking status: quit > 6 months  (07/12/2010)  Lipids   Total Cholesterol: Not documented   Lipid panel action/deferral: Deferred   LDL: Not documented   LDL Direct: Not documented   HDL: Not documented   Triglycerides: Not documented   Lipid panel due: 10/19/2010   Process Orders Check Orders Results:     [facility]: ABN not required for this insurance Tests Sent for requisitioning (July 13, 2010 7:49 AM):     07/12/2010: [facility] -- T-Drug Screen-Urine, (single) [[id number]] (signed)       Immunizations Administered:  Influenza Vaccine # 1:    Vaccine Type: Fluvax Non-MCR    Site: right deltoid    Mfr: [email or web address]    Dose: 0.5 ml    Route: IM    Given by: Angelina Ok RN    Exp. Date: 04/14/2011    Lot #: [id number]    VIS given: 05/09/10 version given July 12, 2010.  Flu Vaccine Consent Questions:    Do you have a history of severe allergic reactions to this vaccine? no    Any prior history of allergic reactions to egg and/or gelatin? no    Do you have a sensitivity to the preservative Thimersol? no    Do you have a past history of Guillan-Barre Syndrome? no    Do you currently have an acute febrile illness? no    Have you ever had a severe reaction to latex? no    Vaccine information given and explained to patient? yes

## 2010-11-14 NOTE — Discharge Summary (Signed)
Summary: Hospital Discharge Update    Hospital Discharge Update:  Date of Admission: 04/27/2010 Date of Discharge: 05/18/2010  Brief Summary:  The following problems were addressed during this hospitalization: 1. 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  Neurosurgery was consulted and he was treated initially with vancomycin and Primaxin, which was later changed to ertapenem.  He was discharged with a PICC line for antibiotic treatment for an 3 additional weeks, with total 6 weeks of IV antibiotics. His hospital course was significant for difficulty with pain management. On discharge, his pain was well controlled. He was discharged on hydrocodone-acetaminaphen, which had previously been part of his home regimen. 2. C. difficile colitis. Mr. Edelson began having frequent diarrhea around the end of July after receiving approx. three weeks of IV antibiotics. On August 1st, his stool was found to be positive for C. difficile toxin and he was starting on oral metronidazole, which he seems to be tolerating well. He will need to continue treatment for C. difficile colitis at least through the duration of his IV antibiotic therapy.  2. Sternoclavicular arthritis.  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  No surgical or medical management is necessary at this time. 3. Hepatic Encephalopathy.  Patient's level of confusion waxed and waned through hopitalization. Ammonia highest level 47 and he was started on lactulose, which was later stopped due to soft blood pressure from diarrhea. Last ammonia was 34 on August 2nd.  4. Hypertension.  He was treated briefly with propranolol for hypertension, but this was stopped later in the hospitalization.  Labs needed at follow-up: CBC with  differential, Comprehensive metabolic panel  Problem list changes:  Added new problem of ABSCESS, EPIDURAL (ICD-324.9) Added new problem of DISCITIS (ICD-722.90) Added new problem of CLOSTRIDIUM DIFFICILE COLITIS (ICD-008.45)  Medication list changes:  Added new medication of ALPRAZOLAM 1 MG TABS (ALPRAZOLAM) Take 1 tablet by mouth three times a day as needed for anxiety - Signed Added new medication of METRONIDAZOLE 500 MG TABS (METRONIDAZOLE) Take 1 tablet by mouth three times a day - Signed Added new medication of HYDROCODONE-ACETAMINOPHEN 10-660 MG TABS (HYDROCODONE-ACETAMINOPHEN) May take every six hours as needed for pain - Signed Removed medication of HYDROCODONE-ACETAMINOPHEN 5-500 MG TABS (HYDROCODONE-ACETAMINOPHEN) .take 1-2 tabs very 6 hours as needed for pain Added new medication of INVANZ 1 GM SOLR (ERTAPENEM SODIUM) To be administered intravenously daily Removed medication of FUROSEMIDE 20 MG TABS (FUROSEMIDE) one tablet by mouth once daily in am Rx of ALPRAZOLAM 1 MG TABS (ALPRAZOLAM) Take 1 tablet by mouth three times a day as needed for anxiety;  #60 x 0;  Signed;  Entered by: Whitney Post MD;  Authorized by: Whitney Post MD;  Method used: Print then Give to Patient Rx of METRONIDAZOLE 500 MG TABS (METRONIDAZOLE) Take 1 tablet by mouth three times a day;  #75 x 0;  Signed;  Entered by: Whitney Post MD;  Authorized by: Whitney Post MD;  Method used: Print then Give to Patient Rx of HYDROCODONE-ACETAMINOPHEN 10-660 MG TABS (HYDROCODONE-ACETAMINOPHEN) May take every six hours as needed for pain;  #42 x 0;  Signed;  Entered by: Whitney Post MD;  Authorized by: Whitney Post MD;  Method used: Print then Give to Patient  The medication, problem, and allergy lists have been updated.  Please see the dictated discharge summary for  details.  Discharge medications:  CLARITIN 10 MG TABS (LORATADINE) Take 1 tablet by mouth once a day PRN PANTOPRAZOLE SODIUM 40 MG TBEC (PANTOPRAZOLE SODIUM)  Take 1 tablet by mouth two times a day POTASSIUM CHLORIDE 20 MEQ PACK (POTASSIUM CHLORIDE) Take 1 tablet by mouth once a day by mouth  as long as on a therapy with furosemide/lasix DOCU SOFT 100 MG CAPS (DOCUSATE SODIUM) Take 1 tablet by mouth two times a day by mouth as needed ALPRAZOLAM 1 MG TABS (ALPRAZOLAM) Take 1 tablet by mouth three times a day as needed for anxiety METRONIDAZOLE 500 MG TABS (METRONIDAZOLE) Take 1 tablet by mouth three times a day HYDROCODONE-ACETAMINOPHEN 10-660 MG TABS (HYDROCODONE-ACETAMINOPHEN) May take every six hours as needed for pain INVANZ 1 GM SOLR (ERTAPENEM SODIUM) To be administered intravenously daily  Other patient instructions:  1) Please return to [facility] on Wednesday August 17th at 4pm for your appointment with Dr. Denton Meek. 2) Please return for follow-up with Dr. Danielle Dess at [facility] on Wednesday August 10th at 10:20am. 3) Please return for follow-up to Dr. Daiva Eves and the [facility] for Infectious [facility] on Monday August 29th at 2pm.  4) If you develop worsening pain, numbness, tingling, weakness, fever, chills, light-headedness, or worsening diarrhea, please call [phone] or return to the Emergency Department for further evaluation.  Note: Hospital Discharge Medications & Other Instructions handout was printed, one copy for patient and a second copy to be placed in hospital chart.  Prescriptions: HYDROCODONE-ACETAMINOPHEN 10-660 MG TABS (HYDROCODONE-ACETAMINOPHEN) May take every six hours as needed for pain  #42 x 0   Entered and Authorized by:   Whitney Post MD   Signed by:   Whitney Post MD on 05/18/2010   Method used:   Print then Give to Patient   RxID:   [id number] METRONIDAZOLE 500 MG TABS (METRONIDAZOLE) Take 1 tablet by mouth three times a day  #75 x 0   Entered and Authorized by:   Whitney Post MD   Signed by:   Whitney Post MD on 05/18/2010   Method used:   Print then Give to  Patient   RxID:   [id number] ALPRAZOLAM 1 MG TABS (ALPRAZOLAM) Take 1 tablet by mouth three times a day as needed for anxiety  #60 x 0   Entered and Authorized by:   Whitney Post MD   Signed by:   Whitney Post MD on 05/18/2010   Method used:   Print then Give to Patient   RxID:   [id number]

## 2010-11-14 NOTE — Progress Notes (Signed)
Summary: phone//gg  Phone Note From Other Clinic   Summary of Call: call from [facility] at [facility] stating they are seeing pt tomorrow for Biopsy  "sternal clavical joint aspiration" Pt wants pain med. and they can not fill for him.  I called pt and he is asking for a refill of pain meds.  He said pharmacy messed up his Rx.  I called [facility] and got a lis of recent refills.  Last refill of OXYCODONE HCL 5 MG TABS # 120 on 9/28  also percocet 7.5/ 325 mg # 20  on  9/23  AND # 64 on  9/2  ALso lorazepam 1 mg # 60 9/28 and alprazolem 1 mg # 120 on 9/22 (d/c 9/28) Pt started acting confused and asked how many minutes were on his phone and other unrelated questions.  I asked if anyone else was with him and I talked to his sister, Steward Drone [phone]).  I asked her if he was confused and she said he was like this some times.  Not unusual.  Hx of Hepatic Cirrhosis.  She states pain med is not helping him.  Unable to come in tomorrow because of procedure he is having.  Initial call taken by: Merrie Roof RN,  August 03, 2010 11:13 AM  Follow-up for Phone Call        I am not refilling pain med early. The aspiration should not result in significantly more pain. He should have enough of his hydrocodone to get him through. Follow-up by: Blanch Media MD,  August 03, 2010 1:36 PM     Appended Document: phone//gg Pt informed

## 2010-11-14 NOTE — Miscellaneous (Signed)
Summary: [employer]ED - PROFESSIONAL COMMUNICATION  ADVANCED - PROFESSIONAL COMMUNICATION   Imported By: Shon Hough 09/05/2010 11:05:18  _____________________________________________________________________  External Attachment:    Type:   Image     Comment:   External Document

## 2010-11-14 NOTE — Discharge Summary (Signed)
Summary: Hospital Discharge Update    Hospital Discharge Update:  Date of Admission: 1..2.-5940 Date of Discharge: 03/24/2010  Brief Summary:  1. Altered mental status secondary to hepatic encephalopathy. 2. Newly-diagnosed hepatitis C with a viral load of 5,940,000. 3. Acute kidney injury secondary to acute tubular necrosis versus     hepatorenal syndrome. 4. Right lower lobe hospital-acquired pneumonia, question of     aspiration. 5. Left lower knee cellulitis with Strep group G bacteremia. 6. Anemia secondary to liver cirrhosis and possible gastrointestinal     bleed. 7. History of alcohol abuse. 8. 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  Other labs needed at follow-up: Please, follow up on CBC to assure that Hgb is stable  Needs a referral to [facility] liver transplant clinic. Has a f/u appointment with Dr. Elnoria Howard  Other follow-up issues:      Problem list changes:  Added new problem of ANEMIA OF OTHER CHRONIC DISEASE (ICD-285.29) - Signed Added new problem of HEPATIC CIRRHOSIS (ICD-571.5) - Signed Added new problem of HEPATITIS C CARRIER (ICD-V02.62) - Signed Added new problem of HEPATIC ENCEPHALOPATHY (ICD-572.2) - Signed Added new problem of ACUTE KIDNEY FAILURE UNSPECIFIED (ICD-584.9) - Signed Added new problem of ASPIRATION PNEUMONIA, RIGHT LOWER LOBE (ICD-507.0) - Signed Added new problem of BACTEREMIA (ICD-790.7) - Signed Added new problem of PERSONAL HISTORY OF ALCOHOLISM (ICD-V11.3) - Signed Added new problem of ALLERGIC RHINITIS, CHRONIC (ICD-477.9) - Signed Added new problem of CONSTIPATION, DRUG INDUCED (ICD-564.09) - Signed Added new problem of OTHER FLUID OVERLOAD (ICD-276.69) - Signed Changed problem from BACTEREMIA (ICD-790.7) to BACTEREMIA (ICD-790.7) - Signed Changed problem from ACUTE KIDNEY FAILURE UNSPECIFIED (ICD-584.9) to RENAL FAILURE, ACUTE, HX OF (ICD-V13.09) - Signed  Medication list changes:  Added new medication of CEFTRIAXONE SODIUM 1 GM SOLR  (CEFTRIAXONE SODIUM) 1 gm IV daily - Signed Added new medication of CLARITIN 10 MG TABS (LORATADINE) Take 1 tablet by mouth once a day PRN - Signed Added new medication of MORPHINE SULFATE CR 15 MG XR12H-TAB (MORPHINE SULFATE) Take 1 tablet by mouth two times a day - Signed Added new medication of PANTOPRAZOLE SODIUM 40 MG TBEC (PANTOPRAZOLE SODIUM) Take 1 tablet by mouth two times a day - Signed Removed medication of CEFTRIAXONE SODIUM 1 GM SOLR (CEFTRIAXONE SODIUM) 1 gm IV daily - Signed Added new medication of AVELOX 400 MG TABS (MOXIFLOXACIN HCL) Take 1 tablet by mouth once a day - Signed Added new medication of HYDROCODONE-ACETAMINOPHEN 5-500 MG TABS (HYDROCODONE-ACETAMINOPHEN) Take one tablet by mouth every 6 hours as needed for pain - Signed Added new medication of FUROSEMIDE 20 MG TABS (FUROSEMIDE) one tablet by mouth once daily in am - Signed Added new medication of POTASSIUM CHLORIDE 20 MEQ PACK (POTASSIUM CHLORIDE) Take 1 tablet by mouth once a day by mouth  as long as on a therapy with furosemide/lasix - Signed Added new medication of DOCU SOFT 100 MG CAPS (DOCUSATE SODIUM) Take 1 tablet by mouth two times a day by mouth as needed - Signed Rx of CEFTRIAXONE SODIUM 1 GM SOLR (CEFTRIAXONE SODIUM) 1 gm IV daily;  #12 x 0;  Signed;  Entered by: Deatra Robinson MD;  Authorized by: Deatra Robinson MD;  Method used: Print then Give to Patient Rx of CLARITIN 10 MG TABS (LORATADINE) Take 1 tablet by mouth once a day PRN;  #30 x 11;  Signed;  Entered by: Deatra Robinson MD;  Authorized by: Deatra Robinson MD;  Method used: Print then Give to Patient Rx of MORPHINE SULFATE CR  15 MG XR12H-TAB (MORPHINE SULFATE) Take 1 tablet by mouth two times a day;  #60 x 0;  Signed;  Entered by: Deatra Robinson MD;  Authorized by: Deatra Robinson MD;  Method used: Print then Give to Patient Rx of PANTOPRAZOLE SODIUM 40 MG TBEC (PANTOPRAZOLE SODIUM) Take 1 tablet by mouth two times a day;  #60 x 11;  Signed;  Entered  by: Deatra Robinson MD;  Authorized by: Deatra Robinson MD;  Method used: Print then Give to Patient Rx of CLARITIN 10 MG TABS (LORATADINE) Take 1 tablet by mouth once a day PRN;  #30 x 11;  Signed;  Entered by: Deatra Robinson MD;  Authorized by: Deatra Robinson MD;  Method used: Print then Give to Patient Rx of MORPHINE SULFATE CR 15 MG XR12H-TAB (MORPHINE SULFATE) Take 1 tablet by mouth two times a day;  #60 x 0;  Signed;  Entered by: Deatra Robinson MD;  Authorized by: Deatra Robinson MD;  Method used: Print then Give to Patient Rx of PANTOPRAZOLE SODIUM 40 MG TBEC (PANTOPRAZOLE SODIUM) Take 1 tablet by mouth two times a day;  #60 x 11;  Signed;  Entered by: Deatra Robinson MD;  Authorized by: Deatra Robinson MD;  Method used: Print then Give to Patient Rx of AVELOX 400 MG TABS (MOXIFLOXACIN HCL) Take 1 tablet by mouth once a day;  #5 x 0;  Signed;  Entered by: Deatra Robinson MD;  Authorized by: Deatra Robinson MD;  Method used: Print then Give to Patient Rx of HYDROCODONE-ACETAMINOPHEN 5-500 MG TABS (HYDROCODONE-ACETAMINOPHEN) Take one tablet by mouth every 6 hours as needed for pain;  #60 x 0;  Signed;  Entered by: Deatra Robinson MD;  Authorized by: Deatra Robinson MD;  Method used: Print then Give to Patient Rx of FUROSEMIDE 20 MG TABS (FUROSEMIDE) one tablet by mouth once daily in am;  #30 x 1;  Signed;  Entered by: Deatra Robinson MD;  Authorized by: Deatra Robinson MD;  Method used: Print then Give to Patient Rx of POTASSIUM CHLORIDE 20 MEQ PACK (POTASSIUM CHLORIDE) Take 1 tablet by mouth once a day by mouth  as long as on a therapy with furosemide/lasix;  #30 x 1;  Signed;  Entered by: Deatra Robinson MD;  Authorized by: Deatra Robinson MD;  Method used: Print then Give to Patient Rx of DOCU SOFT 100 MG CAPS (DOCUSATE SODIUM) Take 1 tablet by mouth two times a day by mouth as needed;  #60 x 1;  Signed;  Entered by: Deatra Robinson MD;  Authorized by: Deatra Robinson MD;  Method used: Print then  Give to Patient  Allergy list changes:  Added new allergy or adverse reaction of CODEINE - Signed Added new allergy or adverse reaction of ROCEPHIN (CEFTRIAXONE SODIUM) - Signed  The medication, problem, and allergy lists have been updated.  Please see the dictated discharge summary for details.  Discharge medications:  CLARITIN 10 MG TABS (LORATADINE) Take 1 tablet by mouth once a day PRN MORPHINE SULFATE CR 15 MG XR12H-TAB (MORPHINE SULFATE) Take 1 tablet by mouth two times a day PANTOPRAZOLE SODIUM 40 MG TBEC (PANTOPRAZOLE SODIUM) Take 1 tablet by mouth two times a day AVELOX 400 MG TABS (MOXIFLOXACIN HCL) Take 1 tablet by mouth once a day HYDROCODONE-ACETAMINOPHEN 5-500 MG TABS (HYDROCODONE-ACETAMINOPHEN) Take one tablet by mouth every 6 hours as needed for pain FUROSEMIDE 20 MG TABS (FUROSEMIDE) one tablet by mouth once daily in am POTASSIUM CHLORIDE 20 MEQ PACK (POTASSIUM CHLORIDE) Take 1 tablet by mouth  once a day by mouth  as long as on a therapy with furosemide/lasix DOCU SOFT 100 MG CAPS (DOCUSATE SODIUM) Take 1 tablet by mouth two times a day by mouth as needed  Other patient instructions:  Please, follow up at [facility] . Ms. Boone witll call you with the date of your appointment. If you would like to contact her instead, please call #: [phone] and schedule an appointment within 1 week. Please, follow up with Dr. Elnoria Howard, a gastroenterologist in regards to your liver disease in 1 week. Dr. Haywood Pao office phone number is#: [phone]. Please, call [facility] with any questions.   Note: Hospital Discharge Medications & Other Instructions handout was printed, one copy for patient and a second copy to be placed in hospital chart.  Prescriptions: DOCU SOFT 100 MG CAPS (DOCUSATE SODIUM) Take 1 tablet by mouth two times a day by mouth as needed  #60 x 1   Entered and Authorized by:   Deatra Robinson MD   Signed by:   Deatra Robinson MD on 03/30/2010    Method used:   Print then Give to Patient   RxID:   [id number] POTASSIUM CHLORIDE 20 MEQ PACK (POTASSIUM CHLORIDE) Take 1 tablet by mouth once a day by mouth  as long as on a therapy with furosemide/lasix  #30 x 1   Entered and Authorized by:   Deatra Robinson MD   Signed by:   Deatra Robinson MD on 03/30/2010   Method used:   Print then Give to Patient   RxID:   [id number] FUROSEMIDE 20 MG TABS (FUROSEMIDE) one tablet by mouth once daily in am  #30 x 1   Entered and Authorized by:   Deatra Robinson MD   Signed by:   Deatra Robinson MD on 03/30/2010   Method used:   Print then Give to Patient   RxID:   [id number] HYDROCODONE-ACETAMINOPHEN 5-500 MG TABS (HYDROCODONE-ACETAMINOPHEN) Take one tablet by mouth every 6 hours as needed for pain  #60 x 0   Entered and Authorized by:   Deatra Robinson MD   Signed by:   Deatra Robinson MD on 03/30/2010   Method used:   Print then Give to Patient   RxID:   [id number] AVELOX 400 MG TABS (MOXIFLOXACIN HCL) Take 1 tablet by mouth once a day  #5 x 0   Entered and Authorized by:   Deatra Robinson MD   Signed by:   Deatra Robinson MD on 03/30/2010   Method used:   Print then Give to Patient   RxID:   [id number] PANTOPRAZOLE SODIUM 40 MG TBEC (PANTOPRAZOLE SODIUM) Take 1 tablet by mouth two times a day  #60 x 11   Entered and Authorized by:   Deatra Robinson MD   Signed by:   Deatra Robinson MD on 03/23/2010   Method used:   Print then Give to Patient   RxID:   [id number] MORPHINE SULFATE CR 15 MG XR12H-TAB (MORPHINE SULFATE) Take 1 tablet by mouth two times a day  #60 x 0   Entered and Authorized by:   Deatra Robinson MD   Signed by:   Deatra Robinson MD on 03/23/2010   Method used:   Print then Give to Patient   RxID:   [id number] CLARITIN 10 MG TABS (LORATADINE) Take 1 tablet by mouth once a day PRN  #30 x 11   Entered and Authorized by:   Deatra Robinson MD   Signed by:   Deatra Robinson  MD on 03/23/2010    Method used:   Print then Give to Patient   RxID:   [id number] PANTOPRAZOLE SODIUM 40 MG TBEC (PANTOPRAZOLE SODIUM) Take 1 tablet by mouth two times a day  #60 x 11   Entered and Authorized by:   Deatra Robinson MD   Signed by:   Deatra Robinson MD on 03/23/2010   Method used:   Print then Give to Patient   RxID:   [id number] MORPHINE SULFATE CR 15 MG XR12H-TAB (MORPHINE SULFATE) Take 1 tablet by mouth two times a day  #60 x 0   Entered and Authorized by:   Deatra Robinson MD   Signed by:   Deatra Robinson MD on 03/23/2010   Method used:   Print then Give to Patient   RxID:   [id number] CLARITIN 10 MG TABS (LORATADINE) Take 1 tablet by mouth once a day PRN  #30 x 11   Entered and Authorized by:   Deatra Robinson MD   Signed by:   Deatra Robinson MD on 03/23/2010   Method used:   Print then Give to Patient   RxID:   [id number] CEFTRIAXONE SODIUM 1 GM SOLR (CEFTRIAXONE SODIUM) 1 gm IV daily  #12 x 0   Entered and Authorized by:   Deatra Robinson MD   Signed by:   Deatra Robinson MD on 03/23/2010   Method used:   Print then Give to Patient   RxID:   [id number]  `````````````````````````````````````````````````````````````````````````````````````````````````````````````````````````````````````````````````````````````````````````````````````````````````````````````````````````````````````````````````````````````````````````````````````````````````````````````````````````````````````````````````````````````````````````````````````````````````````````````````````````````````````````````````````````````````````````````````````````````````````````````````````````````````````````````````````````````````````````````````````````````````````````````````````````````````````````````````````````````````````````````````````````````````````````````````````````````````````````````````````````````````````````````````````````````````````````````````````````````````````

## 2010-11-14 NOTE — Assessment & Plan Note (Signed)
Summary: pain med not strong enough/pcp-karimova/hla   Vital Signs:  Patient profile:   60 year old male Height:      72 inches (182.88 cm) Weight:      177.01 pounds (80.46 kg) BMI:     24.09 Temp:     98 degrees F (36.67 degrees C) oral Pulse rate:   122 / minute BP sitting:   149 / 90  (right arm) Cuff size:   regular  Vitals Entered By: Angelina Ok RN (April 24, 2010 2:19 PM) CC: Depression Is Patient Diabetic? No Pain Assessment Patient in pain? yes     Location: left shoulder, arms Intensity: 10 plus Type: sharp, jabbing Onset of pain  Constant Nutritional Status BMI of 19 -24 = normal  Have you ever been in a relationship where you felt threatened, hurt or afraid?No   Does patient need assistance? Functional Status Self care Ambulation Normal Comments Pain in shoulder and both arms.  Jabbing sharp pain.  Pain has been worsening.  Vicodin is not helping.   Morphine 30 mg helped when he was taking Vicodin as well.  Needs refills on pain med.   Primary Care Provider:  Deatra Robinson MD  CC:  Depression.  History of Present Illness: Alex Henry is a 60 year old man with PMH as described in [facility] here today for a follow up.  His hepatitis C: No nausea, vomiting or change in the colour of urine noticed. His appetite is still suppresed and he has lost 30 pounds since we he got hepatitis. I will refer him to hepatitis clinic in [facility] today.  He complains of b/l shoulder pain which is getting worse, he said that the pain is present in both sides left>right 10/10 at its worse and he is unable to sleep well. The pain is not a/w chest pain but he does have some neck pain. When he had PT/OT coming to his house after his 3 weeks hospitalization and the pain was not as bad at that time. He said the morphine prescribed previously used to help.  BP is raised today in the setting of acute pain.       Depression History:      The patient is having a depressed mood most  of the day and has a diminished interest in his usual daily activities.        The patient denies that he feels like life is not worth living, denies that he wishes that he were dead, and denies that he has thought about ending his life.         Preventive Screening-Counseling & Management  Alcohol-Tobacco     Alcohol drinks/day: 0     Smoking Status: quit > 6 months  Comments: Dips  Problems Prior to Update: 1)  Elevated Blood Pressure  (ICD-796.2) 2)  Shoulder Pain, Bilateral  (ICD-719.41) 3)  Pain in Joint, Multiple Sites  (ICD-719.49) 4)  Anemia, Hx of  (ICD-V12.3) 5)  Bacteremia  (ICD-790.7) 6)  Renal Failure, Acute, Hx of  (ICD-V13.09) 7)  Aspiration Pneumonia, Right Lower Lobe  (ICD-507.0) 8)  Hepatic Cirrhosis  (ICD-571.5) 9)  Other Fluid Overload  (ICD-276.69) 10)  Hepatitis C Carrier  (ICD-V02.62) 11)  Hepatic Encephalopathy  (ICD-572.2) 12)  Anemia of Other Chronic Disease  (ICD-285.29) 13)  Personal History of Alcoholism  (ICD-V11.3) 14)  Constipation, Drug Induced  (ICD-564.09) 15)  Allergic Rhinitis, Chronic  (ICD-477.9)  Medications Prior to Update: 1)  Claritin 10 Mg Tabs (Loratadine) .Marland KitchenMarland KitchenMarland Kitchen  Take 1 Tablet By Mouth Once A Day Prn 2)  Pantoprazole Sodium 40 Mg Tbec (Pantoprazole Sodium) .... Take 1 Tablet By Mouth Two Times A Day 3)  Furosemide 20 Mg Tabs (Furosemide) .... One Tablet By Mouth Once Daily in Am 4)  Potassium Chloride 20 Meq Pack (Potassium Chloride) .... Take 1 Tablet By Mouth Once A Day By Mouth  As Long As On A Therapy With Furosemide/lasix 5)  Docu Soft 100 Mg Caps (Docusate Sodium) .... Take 1 Tablet By Mouth Two Times A Day By Mouth As Needed  Current Medications (verified): 1)  Claritin 10 Mg Tabs (Loratadine) .... Take 1 Tablet By Mouth Once A Day Prn 2)  Pantoprazole Sodium 40 Mg Tbec (Pantoprazole Sodium) .... Take 1 Tablet By Mouth Two Times A Day 3)  Furosemide 20 Mg Tabs (Furosemide) .... One Tablet By Mouth Once Daily in Am 4)   Potassium Chloride 20 Meq Pack (Potassium Chloride) .... Take 1 Tablet By Mouth Once A Day By Mouth  As Long As On A Therapy With Furosemide/lasix 5)  Docu Soft 100 Mg Caps (Docusate Sodium) .... Take 1 Tablet By Mouth Two Times A Day By Mouth As Needed 6)  Hydrocodone-Acetaminophen 5-500 Mg Tabs (Hydrocodone-Acetaminophen) .... .take 1-2 Tabs Very 6 Hours As Needed For Pain  Allergies (verified): 1)  ! Codeine 2)  ! Rocephin (Ceftriaxone Sodium)  Directives: 1)  Full Code   Past History:  Family History: Last updated: 04/18/2010 MI --father diet at 35 y/o Colon CA ->maternal aunt at 46 y/o  Social History: Last updated: 04/18/2010 Lives alone, in a house has one daughter and one sister who are supportive Uninsured completed high school. Works in Occupational hygienist  Risk Factors: Alcohol Use: 0 (04/24/2010)  Risk Factors: Smoking Status: quit > 6 months (04/24/2010)  Family History: Reviewed history from 04/18/2010 and no changes required. MI --father diet at 48 y/o Colon CA ->maternal aunt at 50 y/o  Social History: Reviewed history from 04/18/2010 and no changes required. Lives alone, in a house has one daughter and one sister who are supportive Uninsured completed high school. Works in Occupational hygienist  Review of Systems      See HPI  Physical Exam  Additional Exam:  Gen: AOx3, in mild distress due to pain Eyes: PERRL, EOMI ENT:MMM, No erythema noted in posterior pharynx Neck: No JVD, No LAP Chest: CTAB with  good respiratory effort CVS: regular rhythmic rate, NO M/R/G, S1 S2 normal Abdo: soft,ND, BS+x4, Non tender and No hepatosplenomegaly EXT: No odema noted, no tenderness could be appreciated on his shoulder joints, moving in all directions without any restriction. Neuro: Non focal, gait is normal Skin: no rashes noted.    Impression & Recommendations:  Problem # 1:  HEPATITIS C CARRIER  (ICD-V02.62) Assessment Comment Only No new symptoms a/w hep C. Advised alcohol cessation and will refer to hepatitis clinic in [facility]. Orders: Hepatitis C Clinic Referral (HepC)  Problem # 2:  SHOULDER PAIN, BILATERAL (ICD-719.41) Assessment: Unchanged PT/OT referral. Ice/heat and pain meds for symptom control. Its gonna take some time before he regains his full strength and was advised to increase his activity level slowly and as advised. His updated medication list for this problem includes:    Hydrocodone-acetaminophen 5-500 Mg Tabs (Hydrocodone-acetaminophen) ..... Marland Kitchentake 1-2 tabs very 6 hours as needed for pain  Orders: T-Drug Screen-Urine, (single) [id number]) Physical Therapy Referral (PT)  Discussed shoulder exercises, use of moist heat or  ice, and medication.   Problem # 3:  ELEVATED BLOOD PRESSURE (ICD-796.2) Most likely to pain and stress. No meds at this time. His updated medication list for this problem includes:    Furosemide 20 Mg Tabs (Furosemide) ..... One tablet by mouth once daily in am  BP today: 149/90 Prior BP: 156/93 (04/18/2010)  Labs Reviewed: Creat: 0.84 (04/18/2010)  Instructed in low sodium diet (DASH Handout) and behavior modification.    Complete Medication List: 1)  Claritin 10 Mg Tabs (Loratadine) .... Take 1 tablet by mouth once a day prn 2)  Pantoprazole Sodium 40 Mg Tbec (Pantoprazole sodium) .... Take 1 tablet by mouth two times a day 3)  Furosemide 20 Mg Tabs (Furosemide) .... One tablet by mouth once daily in am 4)  Potassium Chloride 20 Meq Pack (Potassium chloride) .... Take 1 tablet by mouth once a day by mouth  as long as on a therapy with furosemide/lasix 5)  Docu Soft 100 Mg Caps (Docusate sodium) .... Take 1 tablet by mouth two times a day by mouth as needed 6)  Hydrocodone-acetaminophen 5-500 Mg Tabs (Hydrocodone-acetaminophen) .... .take 1-2 tabs very 6 hours as needed for pain  Patient Instructions: 1)  Please schedule a  follow-up appointment in 1 month. 2)  Please schedule an appointment with your primary doctor in 1 month. 3)  Limit your Sodium (Salt). 4)  It is important that you exercise regularly at least 20 minutes 5 times a week. If you develop chest pain, have severe difficulty breathing, or feel very tired , stop exercising immediately and seek medical attention. 5)  Check your Blood Pressure regularly. If it is above:140/90 you should make an appointment.  Prevention & Chronic Care Immunizations   Influenza vaccine: Not documented   Influenza vaccine deferral: Deferred  (04/18/2010)   Influenza vaccine due: 06/15/2010    Tetanus booster: Not documented   Tetanus booster due: 04/18/2020    Pneumococcal vaccine: Not documented   Pneumococcal vaccine deferral: Deferred  (04/18/2010)   Pneumococcal vaccine due: 04/19/2015  Colorectal Screening   Hemoccult: Not documented   Hemoccult action/deferral: Deferred  (04/18/2010)   Hemoccult due: 04/19/2011    Colonoscopy: Not documented   Colonoscopy action/deferral: GI referral  (04/18/2010)  Other Screening   PSA: Not documented   PSA action/deferral: Discussed-PSA declined  (04/18/2010)   PSA due due: 04/19/2011   Smoking status: quit > 6 months  (04/24/2010)  Lipids   Total Cholesterol: Not documented   Lipid panel action/deferral: Deferred   LDL: Not documented   LDL Direct: Not documented   HDL: Not documented   Triglycerides: Not documented   Lipid panel due: 10/19/2010    Vital Signs:  Patient profile:   60 year old male Height:      72 inches (182.88 cm) Weight:      177.01 pounds (80.46 kg) BMI:     24.09 Temp:     98 degrees F (36.67 degrees C) oral Pulse rate:   122 / minute BP sitting:   149 / 90  (right arm) Cuff size:   regular  Vitals Entered By: Angelina Ok RN (April 24, 2010 2:19 PM)  Process Orders Check Orders Results:     [facility]: ABN not required for this insurance Order queued  for requisitioning for [facility]: April 24, 2010 3:06 PM  Tests Sent for requisitioning (April 24, 2010 3:06 PM):     04/24/2010: [facility] -- T-Drug Screen-Urine, (single) [[id number]] (signed)  Process Orders Check Orders Results:     [facility]: ABN not required for this insurance Order queued for requisitioning for [facility]: April 24, 2010 3:06 PM  Tests Sent for requisitioning (April 24, 2010 3:06 PM):     04/24/2010: [facility] -- T-Drug Screen-Urine, (single) [[id number]] (signed)

## 2010-11-14 NOTE — Miscellaneous (Signed)
Summary: [facility] CERTIF[facility]  [facility] CERTIF[facility]   Imported By: Shon Hough 06/20/2010 10:38:36  _____________________________________________________________________  External Attachment:    Type:   Image     Comment:   External Document

## 2010-11-14 NOTE — Discharge Summary (Signed)
Summary: Hospital Discharge Update    Hospital Discharge Update:  Date of Admission: 08/24/2010 Date of Discharge: 08/25/2010  Brief Summary:  60 yo man with hx of left SCJ septic arthritis, hep C with cirrhosis presents with abdominal pain , N/V, and 1 episode of diarrhea.  He only had 1 episode of watery diarrhea in the hospital.  C.diff PCR and toxin were sent.  Culture is still pending.  His N/V improved with antimemetics.   We will continue to prophylaxically treat him with Flaggyl and continue Vancomycin per ID.   Labs needed at follow-up: CBC with differential, Basic metabolic panel  Other labs needed at follow-up: C. diff PCR and toxin  Medication list changes:  Added new medication of FLAGYL 500 MG TABS (METRONIDAZOLE) 1 tablet by mouth  three times a day x 10 days - Signed Rx of FLAGYL 500 MG TABS (METRONIDAZOLE) 1 tablet by mouth  three times a day x 10 days;  #30 x 0;  Signed;  Entered by: Rosana Berger MD;  Authorized by: Rosana Berger MD;  Method used: Handwritten  The medication, problem, and allergy lists have been updated.  Please see the dictated discharge summary for details.  Discharge medications:  PRILOSEC OTC 20 MG TBEC (OMEPRAZOLE MAGNESIUM) Take one to two tablets by mouth daily DOCU SOFT 100 MG CAPS (DOCUSATE SODIUM) Take 1 tablet by mouth two times a day by mouth as needed NEURONTIN 600 MG TABS (GABAPENTIN) Take 1 tablet by mouth three times a day OXYCODONE HCL 10 MG TABS (OXYCODONE HCL) Take one tablet by mouth every 4 hours as needed for pain LORAZEPAM 1 MG TABS (LORAZEPAM) Take 1 tablet by mouth two times a day as needed for anxiety VANCOMYCIN HCL 1000 MG SOLR (VANCOMYCIN HCL) DOSED FOR TROUGH 15-20 Initial dosing: 1gm IV q8hr FLAGYL 500 MG TABS (METRONIDAZOLE) 1 tablet by mouth  three times a day x 10 days  Other patient instructions:  Take Flaggyl two times a day x 10 days Follow up with Outpatient clinic in 1-2 weeks   Note: Hospital Discharge  Medications & Other Instructions handout was printed, one copy for patient and a second copy to be placed in hospital chart.
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
## 2010-11-14 NOTE — Consult Note (Signed)
Summary: [facility]06-13-2010-04-07-2011  [facility]2010-04-07-2011   Imported By: Margie Billet 04/24/2010 15:17:56  _____________________________________________________________________  External Attachment:    Type:   Image     Comment:   External Document

## 2010-11-14 NOTE — Progress Notes (Signed)
Summary: refills/hla  Phone Note Call from Patient   Summary of Call: pt calls c/o pain med not helping, please advise Initial call taken by: Marin Roberts RN,  April 20, 2010 5:50 PM  Follow-up for Phone Call        Please, have the patient come in for an OV with the first available provider. Follow-up by: Deatra Robinson MD,  April 22, 2010 10:57 AM  Additional Follow-up for Phone Call Additional follow up Details #1::        appt given, mon 7/11 at 1400 Additional Follow-up by: Marin Roberts RN,  April 24, 2010 1:34 PM

## 2010-11-14 NOTE — Initial Assessments (Signed)
INTERNAL MEDICINE ADMISSION HISTORY AND PHYSICAL  Attending: Dr. Rogelia Boga First Contact: AI Ms. Allyson Sabal [phone] Second Contact: Dr. Threasa Beards [phone]  PCP:Dr. Denton Meek  CC: Arm pain  HPI:  This is a 60 year old male with a past medical history significant for cirrhosis, hx of alcoholism, and recent hospitalization (May, 2011), who presents with a two week history of pain radiating down his arms.  The pain was insidious in onset and was inially intermittent, however, the pain seems to be progressing to more of a constant state.  The pain is described as "electricity" shooting down the arms, it is sharp and rated a 10/10 in severity.  The pain is exacerbated by moment, but the patient is able to partially relieve the pain by curling up his arm and arching his back.  On occasion, Mr. Chapdelaine has noticed that the pain radiates up into his left clavicle.  The patient denies any new difficulty with fine motor tasks such as picking up small objects and denies any numbness, tingling, or pain in his neck.  The patient does endorse some weakness though he states that this is secondary to pain.  The patient denies any problems with his bowel or bladder as well as any difficulty with ambulation.  He also denies any recent fever, cough, nausea, dysuria, vomiting, or change in UOP.  Of note, the patient was admitted to the hospital on 03/07/10 for subjective fever, malaise, arthralgias and intermittent AMS,  he was found to have hepatic encephalopathy, acute renal failure, and he developed healthcare  associated pneumonia.  During that admission he was found to be bacteremic with Group D streptococcus.  The patient was treated with antibiotics and discharged on 03/30/10.  ALLERGIES:  ! CODEINE- Vomits ! ROCEPHIN (CEFTRIAXONE SODIUM) - rash over body  PAST MEDICAL HISTORY:  Shoulder Pain, Bilateral  (ICD-719.41) Pain in Joint, Multiple Sites  (ICD-719.49) Bacteremia  (ICD-790.7) Renal Failure, Acute, Hx of   (ICD-V13.09) Aspiration Pneumonia, Right Lower Lobe  (ICD-507.0) Hepatic Cirrhosis  (ICD-571.5) Hepatitis C Carrier  (ICD-V02.62) - Diagnosed last admission,  never treated.  Hepatic Encephalopathy  (ICD-572.2) Anemia of Other Chronic Disease  (ICD-285.29) Personal History of Alcoholism  (ICD-V11.3) Allergic Rhinitis, Chronic  (ICD-477.9)   MEDICATIONS: CLARITIN 10 MG TABS (LORATADINE) Take 1 tablet by mouth once a day PRN PANTOPRAZOLE SODIUM 40 MG TBEC (PANTOPRAZOLE SODIUM) Take 1 tablet by mouth two times a day FUROSEMIDE 20 MG TABS (FUROSEMIDE) one tablet by mouth once daily in am POTASSIUM CHLORIDE 20 MEQ PACK (POTASSIUM CHLORIDE) Take 1 tablet by mouth once a day by mouth  as long as on a therapy with furosemide/lasix DOCU SOFT 100 MG CAPS (DOCUSATE SODIUM) Take 1 tablet by mouth two times a day by mouth as needed HYDROCODONE-ACETAMINOPHEN 5-500 MG TABS (HYDROCODONE-ACETAMINOPHEN) .take 1-2 tabs very 6 hours as needed for pain   SOCIAL HISTORY: Lives with girlfriend has one daughter and one sister who are supportive. Sister lives in [place] Uninsured completed high school. Works in [employer], retired from Engineer, maintenance (IT)  Does not smoke, stopped drinking two months ago. Denies elicit drug use.   FAMILY HISTORY MI --father diet at 68 y/o Colon CA ->maternal aunt at 59 y/o  Brother with Cirrhosis due to Hepatitis C Brother who died at young age after being hit by a car on his bike.   ROS: Negative as per HPI.   VITALS:  T: 98.6 P:109  BP:168/90  R:16  O2SAT: 96 ON:RA  PHYSICAL EXAM: General:  alert,  well-developed, and cooperative to examination.   Head:  normocephalic and atraumatic.   Eyes:  vision grossly intact, pupils equal, pupils round, pupils reactive to light, no injection and anicteric.   Mouth:  pharynx pink and moist, no erythema, and no exudates.   Neck:  supple, no tenderness over spinous processes, full ROM, no thyromegaly, no JVD, and no carotid  bruits.   Lungs:  normal respiratory effort, no accessory muscle use, normal breath sounds, no crackles, and no wheezes.  Heart:  normal rate, regular rhythm, no murmur, no gallop, and no rub.   Abdomen:  soft, non-tender, normal bowel sounds, no distention, no guarding, no rebound tenderness, no hepatomegaly, and no splenomegaly.   Msk:  No asterixis, no joint swelling, no joint warmth, and no redness over joints.   Pulses:  2+ DP/PT pulses bilaterally Extremities:  No cyanosis, clubbing, edema  Neurologic:  alert & oriented X3, cranial nerves II-XII intact, strength was 5/5 through out including intrinsic muscles of the hand except for 4/5 strength in the triceps bilaterally due to pain, sensation intact to light touch throughout.  Skin:  turgor normal and no rashes.   Psych:  Oriented X3, memory intact for recent and remote, normally interactive, good eye contact, not anxious appearing, and not depressed  appearing.  LABS: CBC+Diff:  WBC                                      6.0               4.0-10.5         K/uL  RBC                                      3.06       l      4.22-5.81        MIL/uL  Hemoglobin (HGB)                         9.9        l      13.0-17.0        g/dL  Hematocrit (HCT)                         28.2       l      39.0-52.0        %  MCV                                      92.0              78.0-100.0       fL  MCH -                                    32.3              26.0-34.0        pg  MCHC  35.1              30.0-36.0        g/dL  RDW                                      15.6       h      11.5-15.5        %  Platelet Count (PLT)                     110        l      150-400          K/uL  Neutrophils, %                           72                43-77            %  Lymphocytes, %                           18                12-46            %  Monocytes, %                             7                 3-12             %   Eosinophils, %                           2                 0-5              %  Basophils, %                             1                 0-1              %  Neutrophils, Absolute                    4.3               1.7-7.7          K/uL  Lymphocytes, Absolute                    1.1               0.7-4.0          K/uL  Monocytes, Absolute                      0.4               0.1-1.0          K/uL  Eosinophils, Absolute  0.1               0.0-0.7          K/uL  Basophils, Absolute                      0.0               0.0-0.1          K/uL  Chem 8 I-Stat   TCO2                                     23                0-100            mmol/L  Ionized Calcium                          1.11       l      1.12-1.32        mmol/L  Hemoglobin (HGB)                         9.9        l      13.0-17.0        g/dL  Hematocrit (HCT)                         29.0       l      39.0-52.0        %  Sodium (NA)                              138               135-145          mEq/L  Potassium (K)                            3.6               3.5-5.1          mEq/L  Chloride                                 104               96-112           mEq/L  Glucose                                  103        h      70-99            mg/dL  BUN                                      9                 6-23  mg/dL  Creatinine                               0.9               0.4-1.5          mg/dL  Cardiac enzymes: CKMB, POC                                <1.0       l      1.0-8.0          ng/mL  Troponin I, POC                          <0.05             0.00-0.09        ng/mL  Myoglobin, POC                           72.4              12-200           ng/mL    STUDIES:  Chest X-ray:   Comparison: 03/26/2010.    Findings: The cardiopericardial silhouette is within normal limits.   Mediastinal contours normal.  The aortic arch atherosclerosis.   Degenerative changes at both shoulders with old  left clavicle   fracture.  No airspace disease.  No effusion.  No edema.  Mild   right middle lobe atelectasis.    IMPRESSION:   No acute cardiopulmonary disease.  Subsegmental right middle lobe   atelectasis.  MRI C spine:   1.  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   2.  Septic arthritis of the left sternoclavicular joint, with   adjacent osteomyelitis in the manubrium and clavicle.   3.  Moderate central stenosis at C4-5 it is predominately due to a   central disc protrusion.   ASSESSMENT AND PLAN: This is a 60 year old male with a history of recent hospitalization and Group D Strep. bacteremia who presents with bilateral arm pain and an MRI consistent with an epidural abscess.   1. Epidural Abscess: MRI demonstrated diskitis/osteomyelitis at C6-C7 with and epidural abscess at this level.  The etiology of the abscess is unknown at this time, however, it is possible that it could be due to bacteremia.  As a result, we will order blood cultures x 2, and a urine culture at this time.  We will also start the patient on empiric antibiotics including primaxin and vancomycin which should give [place] gram +, gram - and anaerobic coverage.  Neurosurgery was consulted and evaluated the patient. Given that the pt had no weakness, they felt that no further intervention was needed at this time, although surgery may be required in the future if the patient fails to respond to antibiotics and if the diskitis persists.  We will order an AP and lateral c-spine x-ray as this will be used by neurosurgery to follow progression. Will get CBC and CMET in AM.   2. Sternoclavicular osteomyelitis:  Likely of similar etiology to the patients epidural abscess. Will consult orthopedics for further evaluation and  consideration of joints space washout.    3. Arm Pain:  The patient has  tried vicodin with no relief of his symptoms.  Given the likely neurogenic cause of his pain we will start gabapentin 600mg  three times a day.   4. Hx of hepatic encephalopathy:  Pt has no asterixis now and is alert and oriented x 3. We will continue to monitor the patients mental status, get follow up CMET in am, and consider ammonia level if pts status declines  5. VTE PROPH: SCD's  6. FEN: NS lock, replete electrolytes if needed, regular diet.  Attending Statment: I have seen and examined the patient. I reviewed the resident/fellow note and agree with the findings and plan of care as documented. My additions and revisions are included.  Name:  Date:

## 2010-11-14 NOTE — Progress Notes (Signed)
Summary: PT NEEDS TO BE PUT ON IV VANCO STAT  Phone Note Outgoing Call   Call placed by: Acey Lav MD,  August 09, 2010 9:20 AM Details for Reason: Pt needs to be PUT ON ANTIBIOTICS Summary of Call: I have left pt another message with phone number with direct extension to where I am sitting in clinic. This man needs to be put on IV antibiotics given findings of bone dstrruction and fluid in the [facility] joint after hx of streptococcal bacteremia. I have been trying to get ahold of him since Saturday with no sucess. I think best thing would be to admit him for PICC placment initiaiton of IV vancomycin (less risk for c diff coliitis) to treat his sternoclavicular septic arthritis and CT surgery consult. If this progresses  off of antibiotics he coul end up with complete destuction of the clavicle and development of pus in the thoracic cavity which could be life threatening and would require aggressive CT surgery. Tamika can we continue to call this gentleman and see if someone in IM can get aold of him too? Initial call taken by: Acey Lav MD,  August 09, 2010 9:24 AM     Appended Document: PT NEEDS TO BE PUT ON IV VANCO STAT call to pt. message left to go to the [facility] ED for treatment.  Pt. informed that this infection is "life-threatening."  Appended Document: PT NEEDS TO BE PUT ON IV VANCO STAT thanks Denise. Hope they call us

## 2010-11-14 NOTE — Medication Information (Signed)
Summary: ALPRAZOLAM/OXYCODONE-ACETAMIOPHEN  ALPRAZOLAM/OXYCODONE-ACETAMIOPHEN   Imported By: Margie Billet 06/27/2010 11:23:33  _____________________________________________________________________  External Attachment:    Type:   Image     Comment:   External Document

## 2010-11-14 NOTE — Progress Notes (Signed)
Summary: pain med  Phone Note Other Incoming   Summary of Call: pt walkin, pt states he  recently had a medication change from alprazolam to lorazepam, he states after taking the 2nd lorazepam according to directions when he went to bed his arms and legs began to flail involuntarily so he threw the medication away, he was ask to go home and retrieve it from the garbage but states the garbage has been picked up. a [place] narc database was done, pt got #120 9/2, it shows nothing filled in august. lorazepam filled 9/28. pt states he has refills on alprazolam, after speaking to dr Onalee Hua, pt is told he may get one of the refills but if he has any more problems w/ controlled substances he is not to dispose of them , he is to bring them to clinic and discuss w/ md.  Initial call taken by: Marin Roberts RN,  August 08, 2010 11:27 AM

## 2010-11-14 NOTE — Progress Notes (Signed)
  Phone Note Outgoing Call   Call placed by: Theotis Barrio NT II,  July 17, 2010 10:39 AM Call placed to: Patient Details for Reason: MRI APPT / [facility] Summary of Call: CALLED MR Youman ([facility] # [phone]) LEFT VOICE MESSAGE FOR HER TO CALL [facility], MR Radice NEEDS TO COME IN FOR LAB WORK BEFOR GETTING HIS MRI OF [place].  LEFT ;MESSAGE FOR HER TO CALL [facility] AS SOON AS POSSIBLE. LELA

## 2010-11-14 NOTE — Miscellaneous (Signed)
INTERNAL MEDICINE ADMISSION HISTORY AND PHYSICAL Attending: Dr. Lina Sayre First contact: Dr. Anselm Jungling [phone] Second contact: Dr. Gilford Rile [phone]  PCP: Dr. Denton Meek  CC: Abdominal pain, N/V and unable to keep anything down.  HPI: 59y/o male with significant hx for strep G bacteremia, Left SCJ septic arthritis and HEP C with cirrhosis; who came to the hospital complaining of abdominal pain, N/V and diarrhea for the last 48 hours PTA. Patient reports that after been taking his IV vancomicyn for almost 2 complete weeks he has started developing some nausea and eventually vomiting everytime he is getting the medications; he is also reporting some greenish diarrhea associated with abdominal pain (mid abdominal area, cramping and intermittent, no radiated and w/o aggravating factors). Patient endorses some chills for the last 2 days as well; but w/o documented fever.  Patient denies any CP, SOB, hematemesis, melena or hematochezia.  ALLERGIES: ! CODEINE ! ROCEPHIN (CEFTRIAXONE SODIUM)   PAST MEDICAL HISTORY: Strep group G bacteremia Septic arthritis (left SCJ) Hep C/cirrhosis Epidural Abscess/diskitis  GERD Allergic rhinitis Anxiety Renal inssuficiency PSA Neuropathy Hx of C. Diff   MEDICATIONS: PRILOSEC OTC 20 MG TBEC (OMEPRAZOLE MAGNESIUM) Take one to two tablets by mouth daily DOCU SOFT 100 MG CAPS (DOCUSATE SODIUM) Take 1 tablet by mouth two times a day by mouth as needed NEURONTIN 600 MG TABS (GABAPENTIN) Take 1 tablet by mouth three times a day OXYCODONE HCL 10 MG TABS (OXYCODONE HCL) Take one tablet by mouth every 4 hours as needed for pain LORAZEPAM 1 MG TABS (LORAZEPAM) Take 1 tablet by mouth two times a day as needed for anxiety VANCOMYCIN HCL 1000 MG SOLR (VANCOMYCIN HCL) DOSED FOR TROUGH 15-20 Initial dosing: 1gm IV q8hr   SOCIAL HISTORY: Lives alone, in a house has one daughter and one sister who are supportive Uninsured completed high school. Works in Administrator  Substance use:Hx of alcohol abuse and marijuana  FAMILY HISTORY MI --father diet at 40 y/o Colon CA ->maternal aunt at 72 y/o   ROS: As per HPI  VITALS: T: 97.9  P:77   BP: 161/100  R: 20  O2SAT: 99% ON:RA  PHYSICAL EXAM: General:  In NAD, alert, well-developed, and cooperative to examination.   Head:  normocephalic and atraumatic.   Eyes:  vision grossly intact, pupils equal, pupils round, pupils reactive to light, no injection and anicteric.   Mouth:  pharynx pink and w/o erythema or exudates. Dentures in place and no ulcers or thrush appreciated. Mild dry mucose-membrane. Neck:  supple, full ROM, no thyromegaly, no JVD, and no carotid bruits.   Lungs:  normal respiratory effort, no accessory muscle use, normal breath sounds, no crackles, and no wheezes.  Heart:  normal rate, regular rhythm, no murmur, no gallop, and no rub.   Abdomen:  soft, mild tenderness to palpation on his mid-centric abdominal area, normal bowel sounds, no distention, no guarding, no rebound tenderness, no hepatomegaly, and no splenomegaly.   Msk:  no joint swelling, no joint warmth, and no redness over joints.   Pulses:  2+ DP/PT pulses bilaterally Extremities:  No cyanosis, clubbing, edema  Neurologic:  alert & oriented X3, cranial nerves II-XII intact, strength normal in all extremities, sensation intact to light touch, and gait normal.   Skin:  turgor normal and no rashes. Multiple tattos observed.   LABS: Lipase  25                11-59            U/L  Bilirubin, Total                         1.7        h      0.3-1.2          mg/dL Bilirubin, Direct                        0.5        h      0.0-0.3          mg/dL Indirect Bilirubin                       1.2        h      0.3-0.9          mg/dL Alkaline Phosphatase                     30         l      39-117           U/L SGOT (AST)                               76         h      0-37              U/L SGPT (ALT)                               61         h      0-53             U/L Total  Protein                           6.6               6.0-8.3          g/dL Albumin-Blood                            2.9        l      3.5-5.2          g/dL  Sodium (NA)                              138               135-145          mEq/L Potassium (K)                            3.3        l      3.5-5.1          mEq/L Chloride  108               96-112           mEq/L CO2                                      24                19-32            mEq/L Glucose                                  98                70-99            mg/dL BUN                                      6                 6-23             mg/dL Creatinine                               0.84              0.4-1.5          mg/dL GFR, Est Non African American            >60               >60              mL/min GFR, Est African American                >60               >60              mL/min  Calcium                                  8.7               8.4-10.5         mg/dL  Occult Blood, Fecal                      NEGATIVE  WBC                                      6.9               4.0-10.5         K/uL RBC                                      3.65       l      4.22-5.81        MIL/uL Hemoglobin HGB  11.3       l      13.0-17.0        g/dL Hematocrit (HCT)                         32.4       l      39.0-52.0        % MCV                                      88.8              78.0-100.0       fL MCH -                                    31.0              26.0-34.0        pg MCHC                                     34.9              30.0-36.0        g/dL RDW                                      14.8              11.5-15.5        % Platelet Count (PLT)                     81         l      150-400          K/uL Neutrophils, %                           66                43-77             % Lymphocytes, %                           24                12-46            % Monocytes, %                             7                 3-12             % Eosinophils, %                           2                 0-5              % Basophils, %  1                 0-1              % Neutrophils, Absolute                    4.6               1.7-7.7          K/uL Lymphocytes, Absolute                    1.6               0.7-4.0          K/uL Monocytes, Absolute                      0.5               0.1-1.0          K/uL Eosinophils, Absolute                    0.1               0.0-0.7          K/uL Basophils, Absolute                      0.1               0.0-0.1          K/uL  ASSESSMENT AND PLAN:  (1-) Abdominal pain, N/V and diarrhea : Patient with significant past history for C. Diff and recent/prolonged antibiotic therapy for his left SCJ osteomylitis. Differential also includes pancreatitis (less likely with normal lipase) and/or gastroenteritis VS recurrent C. Diff (most likely) -Will admit to regular bed with contact precautions while r/o C.Diff -Will check C. Diff PCR and also C.Diff stool antigen -Will start empiric therapy with flagyl while waiting on C. Diff results. -Will provide supportive care, fluid resucitation and electrolytes repletion. -Will use zofran as an antiemetic for N?V control.  (2) Left SCJ septic arthritis/osteomyelitis: Patient stable, no new complaints other than the nausea and GI symptoms now with the use of vancomycin. -Will continue vancomycin per Dr. Daiva Eves recommendations for now, and will treat him with antiemetics. -Will continue oxycodone for pain control.  (3)Hep C/Cirrhosis: LFT chronically elevated, PT/INR stable; no ictericia or active complaints at this moment; will just monitor for now. (4) Hypokalemia: Most likely associated with his diarrhea; will check Mg level and replete potassium. (5) Hx of renal  inssuficiency: Cr back to baseline. Will monitor levels for now. 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 Will avoid anticoagulation, will check PT/PTT and will monitor levels.  (7) Allergic Rhinitis: Will continue loratadine 10mg  daily. (8) GERD: Will start him on pepcid.Will d/c PPI's due to their increase risk for C. Diff infections. (9) Anxiety: Continue lorazepam (10) VTE PROPH: SCD's and early ambulation (due to Thrombocytopenia)

## 2010-11-14 NOTE — Progress Notes (Signed)
Summary: refill/ hla  Phone Note Refill Request Message from:  Patient on September 11, 2010 5:31 PM  Refills Requested: Medication #1:  OXYCODONE HCL 10 MG TABS Take one tablet by mouth every 4 hours as needed for pain   Dosage confirmed as above?Dosage Confirmed   Last Refilled: 11/1 Initial call taken by: Marin Roberts RN,  September 11, 2010 5:31 PM  Follow-up for Phone Call        Refill approved-nurse to complete Follow-up by: Deatra Robinson MD,  September 12, 2010 1:34 PM  Additional Follow-up for Phone Call Additional follow up Details #1::        Oxycodone cannot be called in; pt. needs to pick up at the office.  Thanks Additional Follow-up by: Chinita Pester RN,  September 13, 2010 5:17 PM    Additional Follow-up for Phone Call Additional follow up Details #2::    Rx given to pt. at office visit. Follow-up by: Chinita Pester RN,  September 18, 2010 12:05 PM  Prescriptions: OXYCODONE HCL 10 MG TABS (OXYCODONE HCL) Take one tablet by mouth every 4 hours as needed for pain  #120 x 0   Entered and Authorized by:   Deatra Robinson MD   Signed by:   Deatra Robinson MD on 09/14/2010   Method used:   Telephoned to ...       [facility] Department (retail)       [address] [place], [place]  [address]       Ph: [phone]       Fax: [phone]   RxID:   [id number] OXYCODONE HCL 10 MG TABS (OXYCODONE HCL) Take one tablet by mouth every 4 hours as needed for pain  #120 x 0   Entered and Authorized by:   Deatra Robinson MD   Signed by:   Deatra Robinson MD on 09/12/2010   Method used:   Telephoned to ...       [facility] Department (retail)       [address] [place], [place]  [address]       Ph: [phone]       Fax: [phone]   RxID:   [id number]

## 2010-11-14 NOTE — Miscellaneous (Signed)
Summary: Hospital Admission  INTERNAL MEDICINE ADMISSION HISTORY AND PHYSICAL Attending: Dr. Anderson Malta First contact: Dr. Loistine Chance [phone] Second contact: Dr. Arvilla Market [phone]  PCP:Dr. Denton Meek  CC: Pain on his shoulder and neck; general malaise.  HPI: 59y/o male with significant hx for strep G bacteremia, Left SCJ septic arthritis and HEP C with cirrhosis; who came to the hospital following recommendations of Dr. Daiva Eves after been feeling bad in general and complaining of worsening bilateral shoulder pain. Patient reports having some nausea and also been out of his chronic pain medicines for the last couple of weeks. He was recently seen in the ID clinic and due to his complaints an MRI of his chest/spine was done demonstarting worsening of his SCJ arthritis and stable diskitis findings w/o signs of recurrence abscess. He had arthrocentesis of his Left SCJ on Oct 21 and that demonstarted small amount of WBC's and no microorganism; plus no growth on the cx.  Patient endorses some nausea, but endorses not difficulty eating or keeping things down.  Patient denies fever/chills, vomiting, abdominal pain, bowel changes, chest pain, SOB or any other complaints.   ALLERGIES: Allergies: ! CODEINE ! ROCEPHIN (CEFTRIAXONE SODIUM)   PAST MEDICAL HISTORY: Strep group G bacteremia Septic arthritis (left SCJ) Hep C/cirrhosis Epidural Abscess/diskitis  GERD Renal inssuficiency PSA  MEDICATIONS: PRILOSEC OTC 20 MG TBEC (OMEPRAZOLE MAGNESIUM) Take one to two tablets by mouth daily DOCU SOFT 100 MG CAPS (DOCUSATE SODIUM) Take 1 tablet by mouth two times a day by mouth as needed OXYCODONE-ACETAMINOPHEN 7.5-325 MG TABS (OXYCODONE-ACETAMINOPHEN) Take one tablet by mouth q 6 hours as needed for pain for 2 weeks. NEURONTIN 600 MG TABS (GABAPENTIN) Take 1 tablet by mouth three times a day OXYCODONE HCL 5 MG TABS (OXYCODONE HCL) 1-2 tabs every 6 hours as needed for pain LORAZEPAM 1 MG TABS  (LORAZEPAM) Take 1 tablet by mouth two times a day as needed for anxiety.   SOCIAL HISTORY: Social History: Lives alone, in a house has one daughter and one sister who are supportive Uninsured completed high school. 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  VITALS: T:97   P:90   BP: 129/83  R:20   O2SAT:99%  ON:RA  PHYSICAL EXAM: General:  alert, well-developed, and cooperative to examination.   Head:  normocephalic and atraumatic.   Eyes:  vision grossly intact, pupils equal, pupils round, pupils reactive to light, no injection and anicteric.   Mouth:  pharynx pink and moist, no erythema, and no exudates. Fair dentition  Neck:  supple, full ROM, no thyromegaly, no JVD, and no carotid bruits.   Lungs:  normal respiratory effort, no accessory muscle use, normal breath sounds, no crackles, and no wheezes.  Heart:  normal rate, regular rhythm, no murmur, no gallop, and no rub.   Abdomen:  soft, non-tender, normal bowel sounds, no distention, no guarding, no rebound tenderness, no hepatomegaly, and no splenomegaly.   Msk:  no joint swelling, no joint warmth, and no redness over joints. Patient with pain on his shoulder bilaterally; also with increased SCJ notch, but not tenderness to palpation.   Pulses:  2+ DP/PT pulses bilaterally Extremities:  No cyanosis, clubbing, edema  Neurologic:  alert & oriented X3, cranial nerves II-XII intact, strength normal in all extremities, sensation intact to light touch, and gait normal.  Skin:  turgor normal, no petechiae and mild rash on his cheeks bilaterally.   Psych:  Oriented X3, memory intact for recent memory; mild impaired of remote memory, normally interactive, good eye contact, not anxious appearing, and not depressed appearing.   LABS:  Amphetamins                               SEE NOTE.         NDT    NONE DETECTED  Barbiturates                             SEE NOTE.         NDT    Oversized comment, see footnote  1  Benzodiazepines                          POSITIVE   a      NDT  Cocaine                                  SEE NOTE.         NDT    NONE DETECTED  Opiates                                  SEE NOTE.         NDT    NONE DETECTED  Tetrahydrocannabinol                     SEE NOTE.         NDT    NONE DETECTED   Sodium (NA)                              141               135-145          mEq/L  Potassium (K)                            3.7               3.5-5.1          mEq/L  Chloride                                 112               96-112           mEq/L  CO2                                      24                19-32            mEq/L  Glucose                                  106  h      70-99            mg/dL  BUN                                      6                 6-23             mg/dL  Creatinine                               0.71              0.4-1.5          mg/dL  GFR, Est Non African American            >60               >60              mL/min  GFR, Est African American                >60               >60              mL/min    Oversized comment, see footnote  1  Bilirubin, Total                         1.1               0.3-1.2          mg/dL  Alkaline Phosphatase                     28         l      39-117           U/L  SGOT (AST)                               105        h      0-37             U/L  SGPT (ALT)                               75         h      0-53             U/L  Total  Protein                           6.7               6.0-8.3          g/dL  Albumin-Blood                            2.8        l      3.5-5.2          g/dL  Calcium  9.2               8.4-10.5         mg/dL    Color, Urine                             YELLOW            YELLOW  Appearance                                CLEAR             CLEAR  Specific Gravity                         1.006             1.005-1.030  pH                                       7.0               5.0-8.0  Urine Glucose                            NEGATIVE          NEG              mg/dL  Bilirubin                                NEGATIVE          NEG  Ketones                                  NEGATIVE          NEG              mg/dL  Blood                                    NEGATIVE          NEG  Protein                                  NEGATIVE          NEG              mg/dL  Urobilinogen                             1.0               0.0-1.0          mg/dL  Nitrite                                  NEGATIVE          NEG  Leukocytes  NEGATIVE          NEG    MICROSCOPIC NOT DONE ON URINES WITH NEGATIVE PROTEIN, BLOOD, LEUKOCYTES,    NITRITE, OR GLUCOSE <1000 mg/dL.   WBC                                      5.3               4.0-10.5         K/uL  RBC                                      3.67       l      4.22-5.81        MIL/uL  Hemoglobin (HGB)                         11.5       l      13.0-17.0        g/dL  Hematocrit (HCT)                         33.6       l      39.0-52.0        %  MCV                                      91.6              78.0-100.0       fL  MCH -                                    31.3              26.0-34.0        pg  MCHC                                     34.2              30.0-36.0        g/dL  RDW                                      15.2              11.5-15.5        %  Platelet Count (PLT)                     82         l      150-400          K/uL  Neutrophils, %                           70                43-77            %  Lymphocytes, %                           21                12-46            %  Monocytes, %                             8                 3-12             %  Eosinophils, %                           1                 0-5               %  Basophils, %                             1                 0-1              %  Neutrophils, Absolute                    3.7               1.7-7.7          K/uL  Lymphocytes, Absolute                    1.1               0.7-4.0          K/uL  Monocytes, Absolute                      0.4               0.1-1.0          K/uL  Eosinophils, Absolute                    0.1               0.0-0.7          K/uL  Basophils, Absolute                      0.0               0.0-0.1          K/uL  SPECIMEN OBTAINED:            08/04/2010 09:50  SPECIMEN DESCRIPTION:         SYNOVIAL                                FLUID                                LEFT  SPECIAL REQUESTS:             LEFT STERNOCLAVICULAR  GRAM SMEAR:  FEW WBC PRESENT,BOTH PMN AND MONONUCLEAR                                NO ORGANISMS SEEN  CULTURE:                      NO GROWTH 3 DAYS                                 REPORT STATUS:                FINAL                                08/07/2010  ASSESSMENT AND PLAN: (1) SCJ septic arthritis: Patient with worsening MRI findings of his left SCJ c/w osteomyelitis vs worsening of septic arthritis. -Will admit to regular bed -Check Blood cx, ESR, CRP -Will start vancomycin per Dr. Daiva Eves recommendations; he might also need  -Will consult CT surgery for evaluation of his worsening findings on his MRI (Will let them decide if he requires I & D in the OR). -Will use oxycodone for pain control and Zofran for nausea. (2)Hep C/Cirrhosis: LFT chronically elevated, PT/INR stable; no ictericia or active complaints at this moment; will just monitor for now. (3) Hx of renal inssuficiency: Cr back to baseline. Will monitor levels for now. 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 Will avoid anticoagulation, will check PT/PTT and will monitor levels.  (5) Allergic Rhinitis: Will continue  loratadine 10mg  daily. (6) ETOH abuse: Will monitor him on CIWA protocol and will also start thiamine and folic acid. (7) GERD: Continue Protonix, currently stable. (8) Anxiety: Continue xanax (9) VTE PROPH: SCD's (due to Thrombocytopenia and questionable need for surgical procedure)

## 2010-11-14 NOTE — Assessment & Plan Note (Signed)
Summary: HFU-/CFB   Vital Signs:  Patient profile:   60 year old male Height:      72 inches (182.88 cm) Weight:      177.4 pounds (80.64 kg) BMI:     24.15 Temp:     98.3 degrees F oral Pulse rate:   96 / minute BP sitting:   134 / 89  (right arm)  Vitals Entered By: Chinita Pester RN (May 31, 2010 4:15 PM) CC: Hospital f/u. Is Patient Diabetic? No Pain Assessment Patient in pain? yes     Location: shoulders/elbows/neck Intensity: 5 Type: aching Onset of pain  Intermittent Nutritional Status BMI of 19 -24 = normal  Have you ever been in a relationship where you felt threatened, hurt or afraid?No   Does patient need assistance? Functional Status Self care Ambulation Normal Comments Lives at [facility]. for PTand ABX.   Primary Care Provider:  Deatra Robinson MD  CC:  Hospital f/u.Marland Kitchen  History of Present Illness: Hospital Follow up appointment: please, refer to Hospital D/C summary. Patient is a resident at [facility]. Patient is on Ertapenem IV until 06/14/2010 or ? 06/27/2010 per Dr. Carolyn Stare (Attending at the living Facility). Denies any fever, chills. Feels tired and "ache" over his upper body. However states that pain is gradulally getiing better; pain ranges from 2/10 to 10/10 in intensitiy. States that Hydrocodoen-10 "do not do anything." Feels "nervous" because staying in hisroom "due to a quarantine." Patient is on Xanax that helps "but wears off.".  Depression History:      The patient denies a depressed mood most of the day.         Preventive Screening-Counseling & Management  Alcohol-Tobacco     Alcohol drinks/day: 0 x 3 months     Smoking Status: quit > 6 months     Year Quit: 60yrs ago  Caffeine-Diet-Exercise     Does Patient Exercise: no     Type of [facility]: [facility]  Problems Prior to Update: 1)  Anxiety Disorder, Generalized  (ICD-300.02) 2)  Syncope  (ICD-780.2) 3)  Clostridium Difficile Colitis   (ICD-008.45) 4)  Discitis  (ICD-722.90) 5)  Abscess, Epidural  (ICD-324.9) 6)  Elevated Blood Pressure  (ICD-796.2) 7)  Shoulder Pain, Bilateral  (ICD-719.41) 8)  Pain in Joint, Multiple Sites  (ICD-719.49) 9)  Anemia, Hx of  (ICD-V12.3) 10)  Bacteremia  (ICD-790.7) 11)  Renal Failure, Acute, Hx of  (ICD-V13.09) 12)  Aspiration Pneumonia, Right Lower Lobe  (ICD-507.0) 13)  Hepatic Cirrhosis  (ICD-571.5) 14)  Other Fluid Overload  (ICD-276.69) 15)  Hepatitis C Carrier  (ICD-V02.62) 16)  Hepatic Encephalopathy  (ICD-572.2) 17)  Anemia of Other Chronic Disease  (ICD-285.29) 18)  Personal History of Alcoholism  (ICD-V11.3) 19)  Constipation, Drug Induced  (ICD-564.09) 20)  Allergic Rhinitis, Chronic  (ICD-477.9)  Medications Prior to Update: 1)  Claritin 10 Mg Tabs (Loratadine) .... Take 1 Tablet By Mouth Once A Day Prn 2)  Pantoprazole Sodium 40 Mg Tbec (Pantoprazole Sodium) .... Take 1 Tablet By Mouth Two Times A Day 3)  Potassium Chloride 20 Meq Pack (Potassium Chloride) .... Take 1 Tablet By Mouth Once A Day By Mouth  As Long As On A Therapy With Furosemide/lasix 4)  Docu Soft 100 Mg Caps (Docusate Sodium) .... Take 1 Tablet By Mouth Two Times A Day By Mouth As Needed 5)  Alprazolam 1 Mg Tabs (Alprazolam) .... Take 1 Tablet By Mouth Three Times A Day As Needed For  Anxiety 6)  Metronidazole 500 Mg Tabs (Metronidazole) .... Take 1 Tablet By Mouth Three Times A Day 7)  Hydrocodone-Acetaminophen 10-660 Mg Tabs (Hydrocodone-Acetaminophen) .... May Take Every Six Hours As Needed For Pain 8)  Invanz 1 Gm Solr (Ertapenem Sodium) .... To Be Administered Intravenously Daily  Current Medications (verified): 1)  Claritin 10 Mg Tabs (Loratadine) .... Take 1 Tablet By Mouth Once A Day Prn 2)  Pantoprazole Sodium 40 Mg Tbec (Pantoprazole Sodium) .... Take 1 Tablet By Mouth Two Times A Day 3)  Potassium Chloride 20 Meq Pack (Potassium Chloride) .... Take 1 Tablet By Mouth Once A Day By Mouth  As Long  As On A Therapy With Furosemide/lasix 4)  Docu Soft 100 Mg Caps (Docusate Sodium) .... Take 1 Tablet By Mouth Two Times A Day By Mouth As Needed 5)  Alprazolam 1 Mg Tabs (Alprazolam) .... Take 1 Tablet By Mouth Three Times A Day As Needed For Anxiety 6)  Metronidazole 500 Mg Tabs (Metronidazole) .... Take 1 Tablet By Mouth Three Times A Day 7)  Hydrocodone-Acetaminophen 10-660 Mg Tabs (Hydrocodone-Acetaminophen) .... May Take Every Six Hours As Needed For Pain 8)  Invanz 1 Gm Solr (Ertapenem Sodium) .... To Be Administered Intravenously Daily  Allergies (verified): 1)  ! Codeine 2)  ! Rocephin (Ceftriaxone Sodium)  Directives: 1)  Full Code   Past History:  Family History: Last updated: 04/18/2010 MI --father diet at 41 y/o Colon [place] ->maternal aunt at 91 y/o  Social History: Last updated: 04/18/2010 Lives alone, in a house has one daughter and one sister who are supportive Uninsured completed high school. Works in Occupational hygienist  Risk Factors: Alcohol Use: 0 x 3 months (05/31/2010) Exercise: no (05/31/2010)  Risk Factors: Smoking Status: quit > 6 months (05/31/2010)  Social History: Does Patient Exercise:  no  Review of Systems  The patient denies anorexia, fever, weight loss, chest pain, syncope, dyspnea on exertion, peripheral edema, prolonged cough, headaches, hemoptysis, abdominal pain, melena, and hematochezia.         per HPI  Physical Exam  General:  Well-developed,well-nourished,in no acute distress; alert,appropriate and cooperative throughout examination Head:  Normocephalic and atraumatic without obvious abnormalities. No apparent alopecia or balding. Eyes:  No corneal or conjunctival inflammation noted. EOMI. Perrla. Funduscopic exam benign, without hemorrhages, exudates or papilledema. Vision grossly normal. Ears:  External ear exam shows no significant lesions or deformities.  Otoscopic examination reveals clear canals,  tympanic membranes are intact bilaterally without bulging, retraction, inflammation or discharge. Hearing is grossly normal bilaterally. Nose:  External nasal examination shows no deformity or inflammation. Nasal mucosa are pink and moist without lesions or exudates. Mouth:  no gingival abnormalities, no dental plaque, pharynx pink and moist, and teeth missing.   Neck:  supple, full ROM, and no masses.  Patient reprots pain with ROM in any direction. Lungs:  Normal respiratory effort, chest expands symmetrically. Lungs are clear to auscultation, no crackles or wheezes. Heart:  Normal rate and regular rhythm. S1 and S2 normal without gallop, murmur, click, rub or other extra sounds. Abdomen:  Bowel sounds positive,abdomen soft and non-tender without masses, organomegaly or hernias noted. Msk:  normal ROM, no joint swelling, no joint warmth, no redness over joints, no joint instability, and no crepitation.   Pulses:  R and L carotid,radial,femoral,dorsalis pedis and posterior tibial pulses are full and equal bilaterally Extremities:  No clubbing, cyanosis, edema, or deformity noted with normal full range of motion of all joints.  Neurologic:  No cranial nerve deficits noted. Station and gait are normal. Plantar reflexes are down-going bilaterally. DTRs are symmetrical throughout. Sensory, motor and coordinative functions appear intact. Skin:  Multiple tattoes.turgor normal, color normal, and no rashes.   Cervical Nodes:  No lymphadenopathy noted Axillary Nodes:  No palpable lymphadenopathy Psych:  Cognition and judgment appear intact. Alert and cooperative with normal attention span and concentration. No apparent delusions, illusions, hallucinations   Impression & Recommendations:  Problem # 1:  DISCITIS (ICD-722.90)  Clincially doing better. Missed his appointment on 05/24/10 with Dr. Danielle Dess (NS) "because was not aware of it."  We rescheduled his appointment with Dr. Danielle Dess ->strongly advised to  adhere with a treatment regimen.  Will await for Dr. Verlee Rossetti recommendations --repeat imaging of a C-spine (?). Pain managment is inadequate (?) vs tolerance. Will change to Percocet 7.5/325 mg by mouth q 6 hours as needed for 2 weeks. Continue with Ertapenem IV and follow up with Dr. Carolyn Stare. Spoke with  Dr. Orvan Falconer (ID) via phone. Patient is to follow up with Dr. Maurice March in ID clinic by the end of August, 2011. Will follow up after the patient sees Dr. Danielle Dess and Dr.Lane.  Orders: Infectious Disease Referral (ID)  Problem # 2:  CLOSTRIDIUM DIFFICILE COLITIS (ICD-008.45)  resolved. However, will continue with Metronidazole throughout Abx therapy.  Orders: Infectious Disease Referral (ID)  Problem # 3:  ANXIETY DISORDER, GENERALIZED (ICD-300.02)  Will increase dose to 1 mg by mouth qid. Cautioned of sedation and risk of addiction. patient verbalized understanding. His updated medication list for this problem includes:    Alprazolam 1 Mg Tabs (Alprazolam) .Marland Kitchen... Take 1 tablet by mouth four times a day as needed for anxiety  Discussed medication use and relaxation techniques.   Problem # 4:  ELEVATED BLOOD PRESSURE (ICD-796.2) No change in regimen for present. BP today: 134/89 Prior BP: 149/90 (04/24/2010)  Labs Reviewed: Creat: 0.84 (04/18/2010)  Instructed in low sodium diet (DASH Handout) and behavior modification.    Complete Medication List: 1)  Claritin 10 Mg Tabs (Loratadine) .... Take 1 tablet by mouth once a day prn 2)  Pantoprazole Sodium 40 Mg Tbec (Pantoprazole sodium) .... Take 1 tablet by mouth two times a day 3)  Potassium Chloride 20 Meq Pack (Potassium chloride) .... Take 1 tablet by mouth once a day by mouth  as long as on a therapy with furosemide/lasix 4)  Docu Soft 100 Mg Caps (Docusate sodium) .... Take 1 tablet by mouth two times a day by mouth as needed 5)  Alprazolam 1 Mg Tabs (Alprazolam) .... Take 1 tablet by mouth four times a day as needed for  anxiety 6)  Metronidazole 500 Mg Tabs (Metronidazole) .... Take 1 tablet by mouth three times a day 7)  Invanz 1 Gm Solr (Ertapenem sodium) .... To be administered intravenously daily 8)  Oxycodone-acetaminophen 7.5-325 Mg Tabs (Oxycodone-acetaminophen) .... Take one tablet by mouth q 6 hours as needed for pain for 2 weeks.  Patient Instructions: 1)  Please, follow up with Dr. Danielle Dess (spine specialist) and Dr. Maurice March at the infectious disease clinic. Our office will contact you with the dates of your appointments. Please, call if you do not hear from [place] within 3 days. 2)  Please, follow up with Dr. Denton Meek AFTER you see Dr. Danielle Dess and Dr. Maurice March. 3)  Call with any questions. Prescriptions: OXYCODONE-ACETAMINOPHEN 7.5-325 MG TABS (OXYCODONE-ACETAMINOPHEN) Take one tablet by mouth q 6 hours as needed for pain for 2 weeks.  #64 x  0   Entered and Authorized by:   Deatra Robinson MD   Signed by:   Deatra Robinson MD on 05/31/2010   Method used:   Print then Give to Patient   RxID:   [id number] ALPRAZOLAM 1 MG TABS (ALPRAZOLAM) Take 1 tablet by mouth four times a day as needed for anxiety  #120 x 11   Entered and Authorized by:   Deatra Robinson MD   Signed by:   Deatra Robinson MD on 05/31/2010   Method used:   Print then Give to Patient   RxID:   [id number]   Prevention & Chronic Care Immunizations   Influenza vaccine: Not documented   Influenza vaccine deferral: Deferred  (04/18/2010)   Influenza vaccine due: 06/15/2010    Tetanus booster: Not documented   Tetanus booster due: 04/18/2020    Pneumococcal vaccine: Not documented   Pneumococcal vaccine deferral: Deferred  (04/18/2010)   Pneumococcal vaccine due: 04/19/2015    H. zoster vaccine: Not documented  Colorectal Screening   Hemoccult: Not documented   Hemoccult action/deferral: Deferred  (04/18/2010)   Hemoccult due: 04/19/2011    Colonoscopy: Not documented   Colonoscopy action/deferral: GI referral   (04/18/2010)  Other Screening   PSA: Not documented   PSA action/deferral: Discussed-PSA declined  (04/18/2010)   PSA due due: 04/19/2011   Smoking status: quit > 6 months  (05/31/2010)  Lipids   Total Cholesterol: Not documented   Lipid panel action/deferral: Deferred   LDL: Not documented   LDL Direct: Not documented   HDL: Not documented   Triglycerides: Not documented   Lipid panel due: 10/19/2010      Resource handout printed.    Appended Document: HFU-/CFB Pt. had been re-scheduled with Dr. Danielle Dess for Aug. 24 @ 1020AM; [facility] (talked to [place]) called and made awared.

## 2010-11-14 NOTE — Progress Notes (Signed)
Summary: Pt. requests to be called/message when Biopsy results are ready  Phone Note Call from Patient Call back at Home Phone [phone]   Caller: Patient Call For: Dr. Daiva Eves Reason for Call: Lab or Test Results Summary of Call: Pt. would like a call when lab results from biopsy are finalized.  A message may be left for the pt. Jennet Maduro RN  August 07, 2010 10:09 AM      Appended Document: Pt. requests to be called/message when Biopsy results are ready Pt needs to be put on IV antibiotics. Simplest may be to bring him in to hospital for direct admission, PICC line placement and to be seen by CT surgery

## 2010-11-14 NOTE — Assessment & Plan Note (Signed)
Summary: NEW HFU/CFB   Vital Signs:  Patient profile:   59 year old male Height:      72 inches (182.88 cm) Weight:      173 pounds (78.64 kg) BMI:     23.55 Temp:     97.8 degrees F (36.56 degrees C) Pulse rate:   115 / minute BP sitting:   156 / 93  (left arm)  Vitals Entered By: Starleen Arms CMA (April 18, 2010 2:17 PM) CC: hsfu, pain in both arms, back, upper body Is Patient Diabetic? No Pain Assessment Patient in pain? yes     Location: upper body Intensity: 10 Type: aching Nutritional Status BMI of 19 -24 = normal Nutritional Status Detail not eating well  Does patient need assistance? Functional Status Self care Ambulation Normal   CC:  hsfu, pain in both arms, back, and upper body.  History of Present Illness: 1. Hospital follow up. 2. C/o of bilatearl shoulder, elbow qnd hands pain. Described as achie; no fever or chills or sweats. No exposure to sick persons; no outdoor travel. Advil did not help. Pain is 10/10 --patient states that"he cries sometimes." Denies any HA, neck pain, abdominal pain, diarrhea, melena or rash. 3. Right hip pain --resolved.  Preventive Screening-Counseling & Management  Alcohol-Tobacco     Alcohol drinks/day: 0     Smoking Status: quit > 6 months  Problems Prior to Update: 1)  Pain in Joint, Multiple Sites  (ICD-719.49) 2)  Anemia, Hx of  (ICD-V12.3) 3)  Bacteremia  (ICD-790.7) 4)  Renal Failure, Acute, Hx of  (ICD-V13.09) 5)  Aspiration Pneumonia, Right Lower Lobe  (ICD-507.0) 6)  Hepatic Cirrhosis  (ICD-571.5) 7)  Other Fluid Overload  (ICD-276.69) 8)  Hepatitis C Carrier  (ICD-V02.62) 9)  Hepatic Encephalopathy  (ICD-572.2) 10)  Anemia of Other Chronic Disease  (ICD-285.29) 11)  Personal History of Alcoholism  (ICD-V11.3) 12)  Constipation, Drug Induced  (ICD-564.09) 13)  Allergic Rhinitis, Chronic  (ICD-477.9)  Current Medications (verified): 1)  Claritin 10 Mg Tabs (Loratadine) .... Take 1 Tablet By Mouth Once A  Day Prn 2)  Pantoprazole Sodium 40 Mg Tbec (Pantoprazole Sodium) .... Take 1 Tablet By Mouth Two Times A Day 3)  Hydrocodone-Acetaminophen 5-500 Mg Tabs (Hydrocodone-Acetaminophen) .... Take One Tablet By Mouth Every 6 Hours As Needed For Pain 4)  Furosemide 20 Mg Tabs (Furosemide) .... One Tablet By Mouth Once Daily in Am 5)  Potassium Chloride 20 Meq Pack (Potassium Chloride) .... Take 1 Tablet By Mouth Once A Day By Mouth  As Long As On A Therapy With Furosemide/lasix 6)  Docu Soft 100 Mg Caps (Docusate Sodium) .... Take 1 Tablet By Mouth Two Times A Day By Mouth As Needed  Allergies: 1)  ! Codeine 2)  ! Rocephin (Ceftriaxone Sodium)  Directives (verified): 1)  Full Code   Past History:  Risk Factors: Alcohol Use: 0 (04/18/2010)  Risk Factors: Smoking Status: quit > 6 months (04/18/2010)  Past medical, surgical, family and social histories (including risk factors) reviewed for relevance to current acute and chronic problems.  Family History: Reviewed history and no changes required. MI --father diet at 66 y/o Colon CA ->maternal aunt at 57 y/o  Social History: Reviewed history and no changes required. Lives alone, in a house has one daughter and one sister who are supportive Uninsured completed high school. 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 No apparent alopecia or balding. Eyes:  No corneal or conjunctival inflammation noted. EOMI. Perrla. Funduscopic exam benign, without hemorrhages, exudates or papilledema. Vision grossly normal. Ears:  External ear exam shows no significant lesions or deformities.  Otoscopic examination reveals clear canals, tympanic membranes are intact bilaterally  without bulging, retraction, inflammation or discharge. Hearing is grossly normal bilaterally. Nose:  External nasal examination shows no deformity or inflammation. Nasal mucosa are pink and moist without lesions or exudates. Mouth:  no gingival abnormalities, no dental plaque, pharynx pink and moist, and teeth missing.   Neck:  supple, full ROM, and no masses.   Lungs:  normal respiratory effort, no intercostal retractions, no accessory muscle use, no dullness, no crackles, and no wheezes.   Heart:  normal rate, regular rhythm, no murmur, no JVD, and no lifts.   Abdomen:  Bowel sounds positive,abdomen soft and non-tender without masses, organomegaly or hernias noted. Msk:  normal ROM, no joint swelling, no joint warmth, no redness over joints, no joint deformities, no joint instability, and no crepitation.   mild TTP to Right subscapularis muscle. Pulses:  R and L carotid,radial,femoral,dorsalis pedis and posterior tibial pulses are full and equal bilaterally Extremities:  No clubbing, cyanosis, edema, or deformity noted with normal full range of motion of all joints.   Neurologic:  No cranial nerve deficits noted. Station and gait are normal. Plantar reflexes are down-going bilaterally. DTRs are symmetrical throughout. Sensory, motor and coordinative functions appear intact. Skin:  turgor normal, color normal, no rashes, no suspicious lesions, no ecchymoses, no petechiae, no purpura, no ulcerations, no edema, and tattoo(s).   Cervical Nodes:  no anterior cervical adenopathy and no posterior cervical adenopathy.   Axillary Nodes:  no R axillary adenopathy and no L axillary adenopathy.   Inguinal Nodes:  no R inguinal adenopathy and no L inguinal adenopathy.   Psych:  Oriented X3, memory intact for recent and remote, normally interactive, good eye contact, not anxious appearing, and not depressed appearing.     Impression & Recommendations:  Problem # 1:  SHOULDER PAIN, BILATERAL  (ICD-719.41)  Unclear etiology of his arthralgia. Will order CBC and Sed rate. Infectious etiology is not likely. ?DJD The following medications were removed from the medication list:    Morphine Sulfate Cr 15 Mg Xr12h-tab (Morphine sulfate) .Marland Kitchen... Take 1 tablet by mouth two times a day His updated medication list for this problem includes:    Hydrocodone-acetaminophen 5-500 Mg Tabs (Hydrocodone-acetaminophen) .Marland Kitchen... Take one tablet by mouth every 6 hours as needed for pain  Orders: Ketorolac-Toradol 15mg  (L2440) Admin of Therapeutic Inj  intramuscular or subcutaneous (10272)  Discussed shoulder exercises, use of moist heat or ice, and medication.   Problem # 2:  ANEMIA, HX OF (ICD-V12.3) Patient denies any melena or brusing or petechiae. Instructed to follow  up with Dr. Elnoria Howard ASAP.Patient verbalized understanding. Orders: Gastroenterology Referral (GI) T-CBC w/Diff [phone])  Problem # 3:  ELEVATED BLOOD PRESSURE (ICD-796.2)  Patient denies any HA, visual or speech defisits or weakness. Patient is on Furosemide due to his recent anasarca due to hypoalbuminemia.will recheck his BP next OV. His updated medication list for this problem includes:    Furosemide 20 Mg Tabs (Furosemide) ..... One tablet by mouth once daily in am  BP today: 156/93  Instructed in low sodium diet (DASH Handout) and behavior modification.  Complete Medication List: 1)  Claritin 10 Mg Tabs (Loratadine) .... Take 1 tablet by mouth once a day prn 2)  Pantoprazole Sodium 40 Mg Tbec (Pantoprazole sodium) .... Take 1 tablet by mouth two times a day 3)  Hydrocodone-acetaminophen 5-500 Mg Tabs (Hydrocodone-acetaminophen) .... Take one tablet by mouth every 6 hours as needed for pain 4)  Furosemide 20 Mg Tabs (Furosemide) .... One tablet by mouth once daily in am 5)  Potassium Chloride 20 Meq Pack (Potassium chloride) .... Take 1 tablet by mouth once a day by mouth  as long as on a therapy with  furosemide/lasix 6)  Docu Soft 100 Mg Caps (Docusate sodium) .... Take 1 tablet by mouth two times a day by mouth as needed  Other Orders: T-CMP with Estimated GFR ([address]) T-Sed Rate (Automated) ([id number])  Patient Instructions: 1)  Please schedule a follow-up appointment in 2 weeks. 2)  please, follow up with Dr. Elnoria Howard (GI) 3)  Call with any questions. Prescriptions: HYDROCODONE-ACETAMINOPHEN 5-500 MG TABS (HYDROCODONE-ACETAMINOPHEN) Take one tablet by mouth every 6 hours as needed for pain  #60 x 3   Entered and Authorized by:   Deatra Robinson MD   Signed by:   Deatra Robinson MD on 04/18/2010   Method used:   Print then Give to Patient   RxID:   [id number]   Process Orders Check Orders Results:     [facility]: ABN not required for this insurance Tests Sent for requisitioning (April 19, 2010 1:11 PM):     04/18/2010: [facility] -- T-CMP with Estimated GFR [[id number]] (signed)     04/18/2010: [facility] -- T-Sed Rate (Automated) [[id number]] (signed)     04/18/2010: [facility] -- T-CBC w/Diff [[id number]] (signed)      Medication Administration  Injection # 1:    Medication: Ketorolac-Toradol 15mg     Diagnosis: SHOULDER PAIN, BILATERAL (ICD-719.41)    Route: IM    Site: R deltoid    Exp Date: 12/14/2011    Lot #: [id number]    Mfr: hospira    Patient tolerated injection without complications    Given by: Starleen Arms CMA (April 18, 2010 3:01 PM)  Orders Added: 1)  Ketorolac-Toradol 15mg  [J1885] 2)  Admin of Therapeutic Inj  intramuscular or subcutaneous [96372] 3)  Gastroenterology Referral [GI] 4)  Est. Patient Level V [99215] 5)  T-CMP with Estimated GFR [[address]] 6)  T-Sed Rate (Automated) [[id number]] 7)  T-CBC w/Diff [[id number]]   Prevention & Chronic Care Immunizations   Influenza vaccine: Not documented   Influenza vaccine deferral: Deferred  (04/18/2010)    Influenza vaccine due: 06/15/2010    Tetanus booster: Not documented   Tetanus booster due: 04/18/2020    Pneumococcal vaccine: Not documented   Pneumococcal vaccine deferral: Deferred  (04/18/2010)   Pneumococcal vaccine due: 04/19/2015  Colorectal Screening   Hemoccult: Not documented   Hemoccult action/deferral: Deferred  (04/18/2010)   Hemoccult due: 04/19/2011    Colonoscopy: Not documented   Colonoscopy action/deferral: GI referral  (04/18/2010)  Other Screening   PSA: Not documented   PSA action/deferral: Discussed-PSA declined  (04/18/2010)   PSA due due: 04/19/2011   Smoking status: quit > 6 months  (04/18/2010)  Lipids   Total Cholesterol: Not documented   Lipid panel action/deferral: Deferred   LDL: Not documented   LDL Direct: Not documented   HDL: Not documented   Triglycerides: Not documented   Lipid panel due: 10/19/2010   Nursing Instructions: Give tetanus  booster today GI referral for screening colonoscopy (see order)

## 2010-11-14 NOTE — Progress Notes (Signed)
Summary: refill/gg    Phone Note Refill Request  on July 06, 2010 9:03 AM  Refills Requested: Medication #1:  OXYCODONE-ACETAMINOPHEN 7.5-325 MG TABS Take one tablet by mouth q 6 hours as needed for pain for 2 weeks..   Last Refilled: 06/16/2010 Pt taking pain meds three time a day and is out of meds.  He was only given # 64 .   He is also out of alprazolam and has been taking this 5 times a day.  He is in pain and not able to sleep.  Can her have a higher dose??  He does have an appointment with you on October 11th.  Can you give him enough to last until then??   Method Requested: Pick up at Office Initial call taken by: Merrie Roof RN,  July 06, 2010 9:03 AM  Follow-up for Phone Call        Patient must be seen, no refills over the phone. He has been treated for a discitis of teh cervical spine back in august and never kept any of his follow-up appointments- worsening pain could mean his infection is back and it could lead to serious neurological complications.  Follow-up by: Julaine Fusi  DO,  July 06, 2010 4:54 PM  Additional Follow-up for Phone Call Additional follow up Details #1::        Will write for 21 tabs. Additional Follow-up by: Julaine Fusi  DO,  July 06, 2010 4:58 PM    Additional Follow-up for Phone Call Additional follow up Details #2::    Appointment made for 9/29.  Can pt have meds to last until then? He has NOT seen Dr Danielle Dess and was not aware he should.  Has a referral been made? Merrie Roof RN  July 06, 2010 4:43 PM   Pt informed Rx is ready Follow-up by: Merrie Roof RN,  July 06, 2010 5:22 PM  Prescriptions: OXYCODONE-ACETAMINOPHEN 7.5-325 MG TABS (OXYCODONE-ACETAMINOPHEN) Take one tablet by mouth q 6 hours as needed for pain for 2 weeks.  #21 x 0   Entered and Authorized by:   Julaine Fusi  DO   Signed by:   Julaine Fusi  DO on 07/06/2010   Method used:   Print then Give to Patient   RxID:    [id number]

## 2010-11-14 NOTE — Miscellaneous (Signed)
[facility]   Imported By: Shon Hough 09/12/2010 11:21:23  _____________________________________________________________________  External Attachment:    Type:   Image     Comment:   External Document

## 2010-11-14 NOTE — Progress Notes (Signed)
Summary: refill/gg  Phone Note Refill Request  on June 16, 2010 2:34 PM  Refills Requested: Medication #1:  PANTOPRAZOLE SODIUM 40 MG TBEC Take 1 tablet by mouth two times a day [facility] does not have pantoprazole, will you change to omeprazole ??   Method Requested: Fax to Local Pharmacy Initial call taken by: Merrie Roof RN,  June 16, 2010 2:36 PM  Follow-up for Phone Call        Rx completed in Dr. Tiajuana Amass Follow-up by: Deatra Robinson MD,  June 16, 2010 3:40 PM    New/Updated Medications: PRILOSEC OTC 20 MG TBEC (OMEPRAZOLE MAGNESIUM) Take one to two tablets by mouth daily Prescriptions: PRILOSEC OTC 20 MG TBEC (OMEPRAZOLE MAGNESIUM) Take one to two tablets by mouth daily  #60 x 11   Entered and Authorized by:   Deatra Robinson MD   Signed by:   Deatra Robinson MD on 06/16/2010   Method used:   Faxed to ...       [facility] (retail)       [address] [place], [place]  [address]       Ph: [phone]       Fax: [phone]   RxID:   [id number]

## 2010-11-14 NOTE — Miscellaneous (Signed)
Summary: Orders Update  Clinical Lists Changes  Orders: Added new Test order of T-Basic Metabolic Panel ([id number]) - Signed  

## 2010-11-14 NOTE — Progress Notes (Signed)
Summary: REfill/gh  Phone Note Refill Request Message from:  Patient on June 16, 2010 11:00 AM  Refills Requested: Medication #1:  OXYCODONE-ACETAMINOPHEN 7.5-325 MG TABS Take one tablet by mouth q 6 hours as needed for pain for 2 weeks..  Medication #2:  ALPRAZOLAM 1 MG TABS Take 1 tablet by mouth four times a day as needed for anxiety Pt was recently discharged from the Nursing Home.   Method Requested: Electronic Initial call taken by: Angelina Ok RN,  June 16, 2010 11:01 AM  Follow-up for Phone Call        Refill approved-nurse to complete Follow-up by: Deatra Robinson MD,  June 16, 2010 11:34 AM  Additional Follow-up for Phone Call Additional follow up Details #1::        Rx given to patient Additional Follow-up by: Angelina Ok RN,  June 16, 2010 3:28 PM    Prescriptions: OXYCODONE-ACETAMINOPHEN 7.5-325 MG TABS (OXYCODONE-ACETAMINOPHEN) Take one tablet by mouth q 6 hours as needed for pain for 2 weeks.  #64 x 0   Entered and Authorized by:   Deatra Robinson MD   Signed by:   Deatra Robinson MD on 06/16/2010   Method used:   Print then Give to Patient   RxID:   [id number] ALPRAZOLAM 1 MG TABS (ALPRAZOLAM) Take 1 tablet by mouth four times a day as needed for anxiety  #120 x 11   Entered and Authorized by:   Deatra Robinson MD   Signed by:   Deatra Robinson MD on 06/16/2010   Method used:   Print then Give to Patient   RxID:   [id number] OXYCODONE-ACETAMINOPHEN 7.5-325 MG TABS (OXYCODONE-ACETAMINOPHEN) Take one tablet by mouth q 6 hours as needed for pain for 2 weeks.  #64 x 0   Entered and Authorized by:   Deatra Robinson MD   Signed by:   Deatra Robinson MD on 06/16/2010   Method used:   Historical   RxID:   [id number]

## 2010-11-14 NOTE — Progress Notes (Signed)
Summary: phone/gg  Phone Note From Other Clinic   Summary of Call: Call from [facility] with [facility] [phone]) stating she faxed pt labs to [place].  States potassium is 2.9 They called and checked on pt and he has vomited 2 times today and has ringing in ears for 3 - 4 days. Vanco trough is 12.8   he is being treated for a septic shoulder.  They want to go up on the 1 Gm q 8 hours.  but want input.  Initial call taken by: Merrie Roof RN,  August 23, 2010 4:49 PM  Additional Follow-up for Phone Call Additional follow up Details #1::        [facility] pharmacist called and asked to call Dr Algis Liming tomorrow about dose of vancomycin. Pt called and no answer.  Message left for him to call clinic tomorrow but if he is vomiting and not taking liquids to go to ED tonight. Additional Follow-up by: Merrie Roof RN,  August 23, 2010 5:23 PM    Additional Follow-up for Phone Call Additional follow up Details #2::    We will try to learn his current status at 5 PM.   If he is still vomiting or unable to eat, will have to go to ER.  K reported as 2.9.  No hx of hypokalemia and no meds that should cause this.  May be the vomiting.  If he is much better and eating, will repeat Bmet in 1-2 days. Please ask ID to respond to question about vanc dosing. Follow-up by: Ulyess Mort MD,  August 23, 2010 5:27 PM   Appended Document: phone/gg Pt called as follow-up and pt has been admitted to hospital last night.

## 2010-11-14 NOTE — Medication Information (Signed)
Summary: [facility]: RX  [facility]: RX   Imported By: Florinda Marker 09/13/2010 15:53:07  _____________________________________________________________________  External Attachment:    Type:   Image     Comment:   External Document

## 2010-11-14 NOTE — Discharge Summary (Signed)
Summary: Hospital Discharge Update    Hospital Discharge Update:  Date of Admission: 08/10/2010 Date of Discharge: 08/13/2010  Brief Summary:  59y/o male with significant hx for strep G bacteremia, Left SCJ septic arthritis and HEP C with cirrhosis; who came to the hospital following recommendations of Dr. Daiva Eves after been feeling bad in general and complaining of worsening bilateral shoulder pain. Patient reports having some nausea and also been out of his chronic pain medicines for the last couple of weeks. He was recently seen in the ID clinic and due to his complaints an MRI of his chest/spine was done demonstarting worsening of his SCJ arthritis and stable diskitis findings w/o signs of recurrence abscess. He had arthrocentesis of his Left SCJ on Oct 21 and that demonstarted small amount of WBC's and no microorganism; plus no growth on the cx.  Patient endorses some nausea, but endorses not difficulty eating or keeping things down.  Patient denies fever/chills, vomiting, abdominal pain, bowel changes, chest pain, SOB or any other complaints.   Other labs needed at follow-up: Consider CRP and ESR  Other follow-up issues:  Consider referral to [facility] for possible I/D if pt is not improved or if his symptoms progress.   Medication list changes:  Removed medication of OXYCODONE-ACETAMINOPHEN 7.5-325 MG TABS (OXYCODONE-ACETAMINOPHEN) Take one tablet by mouth q 6 hours as needed for pain for 2 weeks. Changed medication from OXYCODONE HCL 5 MG TABS (OXYCODONE HCL) 1-2 tabs every 6 hours as needed for pain to OXYCODONE HCL 10 MG TABS (OXYCODONE HCL) Take one tablet by mouth every 4 hours as needed for pain - Signed Changed medication from VANCOMYCIN HCL 1000 MG SOLR (VANCOMYCIN HCL) DOSED FOR TROUGH 15-20 to VANCOMYCIN HCL 1000 MG SOLR (VANCOMYCIN HCL) DOSED FOR TROUGH 15-20 Initial dosing: 1gm IV q8hr Rx of OXYCODONE HCL 10 MG TABS (OXYCODONE HCL) Take one tablet by mouth every 4 hours as  needed for pain;  #120 x 0;  Signed;  Entered by: Nelda Bucks DO;  Authorized by: Nelda Bucks DO;  Method used: Handwritten  The medication, problem, and allergy lists have been updated.  Please see the dictated discharge summary for details.  Discharge medications:  PRILOSEC OTC 20 MG TBEC (OMEPRAZOLE MAGNESIUM) Take one to two tablets by mouth daily DOCU SOFT 100 MG CAPS (DOCUSATE SODIUM) Take 1 tablet by mouth two times a day by mouth as needed NEURONTIN 600 MG TABS (GABAPENTIN) Take 1 tablet by mouth three times a day OXYCODONE HCL 10 MG TABS (OXYCODONE HCL) Take one tablet by mouth every 4 hours as needed for pain LORAZEPAM 1 MG TABS (LORAZEPAM) Take 1 tablet by mouth two times a day as needed for anxiety VANCOMYCIN HCL 1000 MG SOLR (VANCOMYCIN HCL) DOSED FOR TROUGH 15-20 Initial dosing: 1gm IV q8hr  Other patient instructions:  You need to see Dr. Daiva Eves in 4 weeks. You have an appointment on November the 23rd at 10:45 am in the ID clinic at [facility]. Please call if you have to reschedule the appointment - phone [phone]. Patient was informed.   You will also have a follow up appointment with Dr. Denton Meek.  The clinic will call you with an appointment date and time.    Note: Hospital Discharge Medications & Other Instructions handout was printed, one copy for patient and a second copy to be placed in hospital chart.

## 2010-11-14 NOTE — Miscellaneous (Signed)
Summary: HIPAA Restrictions  HIPAA Restrictions   Imported By: Florinda Marker 09/12/2010 08:45:31  _____________________________________________________________________  External Attachment:    Type:   Image     Comment:   External Document

## 2010-11-14 NOTE — Assessment & Plan Note (Signed)
Summary: hsfu  need chart septic joint   Vital Signs:  Patient profile:   60 year old male Height:      72 inches (182.88 cm) Weight:      180.25 pounds (81.93 kg) BMI:     24.53 Temp:     98.5 degrees F (36.94 degrees C) oral Pulse rate:   106 / minute BP sitting:   118 / 79  (left arm)  Vitals Entered By: Starleen Arms CMA (September 06, 2010 11:10 AM) CC: f/u Is Patient Diabetic? No Pain Assessment Patient in pain? yes     Location: all over Intensity: 8 Type: aching Nutritional Status BMI of 19 -24 = normal  Does patient need assistance? Functional Status Self care Ambulation Normal   Primary Provider:  Deatra Robinson MD  CC:  f/u.  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 He was later found on imaging found to have evidence of sternoclavicular septic arthritis. However when orthopedics were consulted they reasoned that this finding was due to remote history. I was not involved in the pts care at that pt in time and I believe that my partners were not as well. In any case the patient developed subsequent worsening pain about his clavice and followup MRI showed worsening of apparent [place] septic arthritis. I arranged for CT guided aspiration of the clavicle and this failed to yield an organism on culture. I had him admitted to the B service and reinitiated IV antibiotics with vancomycin to cover him for [facility] septic arthritis and to do so with less risk of inducing recurrnece of his c diff colitis (vs carbapenem). Of note his ESR anc CRP were normal on check during that hospitalization. He has done well on IV vancomycin though he still continues to complain of pain at the [facility] joint as well as in his upper and lower back and requested  narcotics from me despite being under cotract with OPC. He denies fevers or chills.  Problems Prior to Update: 1)  Septic Arthritis  (ICD-711.00) 2)  Need Prophylactic Vaccination&inoculation Flu  (ICD-V04.81) 3)  Anxiety Disorder, Generalized  (ICD-300.02) 4)  Syncope  (ICD-780.2) 5)  Clostridium Difficile Colitis  (ICD-008.45) 6)  Discitis  (ICD-722.90) 7)  Hx of Abscess, Epidural  (ICD-324.9) 8)  Elevated Blood Pressure  (ICD-796.2) 9)  Shoulder Pain, Bilateral  (ICD-719.41) 10)  Pain in Joint, Multiple Sites  (ICD-719.49) 11)  Anemia, Hx of  (ICD-V12.3) 12)  Bacteremia  (ICD-790.7) 13)  Renal Failure, Acute, Hx of  (ICD-V13.09) 14)  Aspiration Pneumonia, Right Lower Lobe  (ICD-507.0) 15)  Hepatic Cirrhosis  (ICD-571.5) 16)  Other Fluid Overload  (ICD-276.69) 17)  Hepatitis C Carrier  (ICD-V02.62) 18)  Hepatic Encephalopathy  (ICD-572.2) 19)  Anemia of Other Chronic Disease  (ICD-285.29) 20)  Personal History of Alcoholism  (ICD-V11.3) 21)  Constipation, Drug Induced  (ICD-564.09) 22)  Allergic Rhinitis, Chronic  (ICD-477.9)  Medications Prior to Update: 1)  Prilosec Otc 20 Mg Tbec (Omeprazole Magnesium) .... Take One To Two Tablets By Mouth Daily 2)  Docu Soft 100 Mg Caps (Docusate Sodium) .... Take 1 Tablet By Mouth Two Times A Day By Mouth As Needed 3)  Neurontin 600 Mg Tabs (Gabapentin) .... Take 1 Tablet By Mouth Three Times  A Day 4)  Oxycodone Hcl 10 Mg Tabs (Oxycodone Hcl) .... Take One Tablet By Mouth Every 4 Hours As Needed For Pain 5)  Lorazepam 1 Mg Tabs (Lorazepam) .... Take 1 Tablet By Mouth Two Times A Day As Needed For Anxiety 6)  Vancomycin Hcl 1000 Mg Solr (Vancomycin Hcl) .... Dosed For Trough 15-20 Initial Dosing: 1gm Iv Q8hr 7)  Flagyl 500 Mg Tabs (Metronidazole) .Marland Kitchen.. 1 Tablet By Mouth  Three Times A Day X 10 Days  Current Medications (verified): 1)  Prilosec Otc 20 Mg Tbec (Omeprazole Magnesium) .... Take One To Two Tablets By Mouth Daily 2)  Docu Soft 100  Mg Caps (Docusate Sodium) .... Take 1 Tablet By Mouth Two Times A Day By Mouth As Needed 3)  Neurontin 600 Mg Tabs (Gabapentin) .... Take 1 Tablet By Mouth Three Times A Day 4)  Oxycodone Hcl 10 Mg Tabs (Oxycodone Hcl) .... Take One Tablet By Mouth Every 4 Hours As Needed For Pain 5)  Lorazepam 1 Mg Tabs (Lorazepam) .... Take 1 Tablet By Mouth Two Times A Day As Needed For Anxiety 6)  Vancomycin Hcl 1000 Mg Solr (Vancomycin Hcl) .... Dosed For Trough 15-20 Initial Dosing: 1gm Iv Q8hr 7)  Flagyl 500 Mg Tabs (Metronidazole) .Marland Kitchen.. 1 Tablet By Mouth  Three Times A Day X 10 Days  Allergies: 1)  ! Codeine 2)  ! Rocephin (Ceftriaxone Sodium)  Directives: 1)  Full Code   Past History:  Past Medical History: Group G streptococcal bactermia Sternoclavicular setptic arthritis Hep C  Family History: Reviewed history from 04/18/2010 and no changes required. MI --father diet at 70 y/o Colon CA ->maternal aunt at 84 y/o  Social History: Lives  with friend, otherwise woutl be homless has one daughter and one sister who are supportive Uninsured completed high school. Unemployed  Review of Systems  The patient denies anorexia, fever, weight loss, weight gain, vision loss, decreased hearing, hoarseness, chest pain, syncope, dyspnea on exertion, peripheral edema, prolonged cough, headaches, hemoptysis, abdominal pain, melena, hematochezia, severe indigestion/heartburn, hematuria, incontinence, genital sores, muscle weakness, suspicious skin lesions, transient blindness, difficulty walking, depression, unusual weight change, abnormal bleeding, and enlarged lymph nodes.    Physical Exam  General:  alert.  multiple tattoos wearing bandannna over head Head:  Normocephalic and atraumatic without obvious abnormalities.  Ears:  no external deformities and ear piercing(s) noted.   Nose:  no external erythema and no nasal discharge.   Mouth:  pharynx pink and moist, no erythema, and no exudates.     Neck:  full ROM.   Lungs:  normal respiratory effort, no crackles, and no wheezes.   Heart:  normal rate, regular rhythm, no murmur, and no gallop.   Abdomen:  soft, non-tender, and normal bowel sounds.   Msk:  he has some tenderness about his Left sternoclavicualr joint, no significant erythema, cervical spine with some minimal tenderness to palpation Extremities:  no edema Neurologic:  alert & oriented X3 and gait normal.   Skin:  tattoosno petechiae and no purpura.   Psych:  Oriented X3, memory intact for recent and remote, and good eye contact.     Impression & Recommendations:  Problem # 1:  SEPTIC ARTHRITIS (ICD-711.00)  WIll have him finish his last 2 wks of IV vancomcyin and stop and have his PICC line pulled His updated medication list for this problem includes:    Oxycodone Hcl 10 Mg Tabs (Oxycodone hcl) .Marland Kitchen... Take one tablet by mouth every 4 hours as needed  for pain    Vancomycin Hcl 1000 Mg Solr (Vancomycin hcl) .Marland Kitchen... Dosed for trough 15-20 initial dosing: 1gm iv q8hr    Flagyl 500 Mg Tabs (Metronidazole) .Marland Kitchen... 1 tablet by mouth  three times a day x 10 days  Orders: Est. Patient Level IV (98119)  Problem # 2:  CLOSTRIDIUM DIFFICILE COLITIS (ICD-008.45)  no evidence of recurrence at presence  Orders: Est. Patient Level IV (14782)  Problem # 3:  DISCITIS (ICD-722.90)  see above now has received several month sof therapy  Orders: Est. Patient Level IV (95621)  Problem # 4:  BACTEREMIA (ICD-790.7)  Group G resolved His updated medication list for this problem includes:    Vancomycin Hcl 1000 Mg Solr (Vancomycin hcl) .Marland Kitchen... Dosed for trough 15-20 initial dosing: 1gm iv q8hr    Flagyl 500 Mg Tabs (Metronidazole) .Marland Kitchen... 1 tablet by mouth  three times a day x 10 days  Orders: Est. Patient Level IV (30865)  Problem # 5:  PAIN IN JOINT, MULTIPLE SITES (ICD-719.49)  he has apparent hx of substance dependence but will need to keep eye for recurrence of infection. He  can talk to [facility] re further narcotic rxs  Orders: Est. Patient Level IV (78469)  Problem # 6:  HEPATITIS C CARRIER (ICD-V02.62)  need to delve into this hx further at next appt  Orders: Est. Patient Level IV (62952)  Patient Instructions: 1)  we will finish another 14 days of vancomycin and then have picc line pulled 2)  make fu appt with Dr. Daiva Eves in 6 wk   Orders Added: 1)  Est. Patient Level IV [84132]

## 2010-11-14 NOTE — Progress Notes (Signed)
Summary: vanc trough and dosing/ hla  Phone Note From Pharmacy   Summary of Call: Amy, pharmacist at [facility] calls to ask to add vanc trough and may they- the pharmacists assume dosing of vanc based on labs? Initial call taken by: Marin Roberts RN,  August 16, 2010 4:51 PM  Follow-up for Phone Call        agree. ok to give verbal order for pharmacy to dose. Follow-up by: Julaine Fusi  DO,  August 20, 2010 5:19 PM

## 2010-11-16 NOTE — Miscellaneous (Signed)
Summary: [facility]  [place] [facility]   Imported By: Florinda Marker 10/30/2010 09:20:47  _____________________________________________________________________  External Attachment:    Type:   Image     Comment:   External Document

## 2010-11-16 NOTE — Miscellaneous (Signed)
Summary: [facility]  [facility]   Imported By: Shon Hough 09/28/2010 09:22:03  _____________________________________________________________________  External Attachment:    Type:   Image     Comment:   External Document

## 2010-11-16 NOTE — Letter (Signed)
Summary: Pt. Assistant Form  Pt. Assistant Form   Imported By: Florinda Marker 10/12/2010 15:01:42  _____________________________________________________________________  External Attachment:    Type:   Image     Comment:   External Document

## 2010-11-16 NOTE — Progress Notes (Addendum)
Summary: med refill/gp  Phone Note Refill Request Message from:  Patient on November 07, 2010 10:33 AM  Refills Requested: Medication #1:  OXYCODONE HCL 10 MG TABS Take one tablet by mouth every 4 hours as needed for pain Last 09/14/10.   Method Requested: Pick up at Office Initial call taken by: Chinita Pester RN,  November 07, 2010 10:34 AM  Follow-up for Phone Call        Rx completed in Dr. Tiajuana Amass. Please, have the patient come and Rx up. thank you. Follow-up by: Deatra Robinson MD,  November 07, 2010 8:12 PM    Prescriptions: OXYCODONE HCL 10 MG TABS (OXYCODONE HCL) Take one tablet by mouth every 4 hours as needed for pain  #120 x 0   Entered and Authorized by:   Deatra Robinson MD   Signed by:   Deatra Robinson MD on 11/07/2010   Method used:   Print then Give to Patient   RxID:   [id number]

## 2010-11-16 NOTE — Medication Information (Signed)
Summary: OXYCODONE  OXYCODONE   Imported By: Margie Billet 10/11/2010 13:56:35  _____________________________________________________________________  External Attachment:    Type:   Image     Comment:   External Document

## 2010-11-16 NOTE — Letter (Signed)
Summary: [facility]: Pt. Assistance  [facility]: Pt. Assistance   Imported By: Florinda Marker 10/26/2010 09:40:47  _____________________________________________________________________  External Attachment:    Type:   Image     Comment:   External Document

## 2010-11-16 NOTE — Miscellaneous (Signed)
Summary: [facility] CERTIF[facility]  [facility] CERTIF[facility]   Imported By: Shon Hough 09/28/2010 09:21:35  _____________________________________________________________________  External Attachment:    Type:   Image     Comment:   External Document

## 2010-11-16 NOTE — Progress Notes (Signed)
Summary: refill/gg  Phone Note Refill Request  on October 10, 2010 11:44 AM  Refills Requested: Medication #1:  OXYCODONE HCL 10 MG TABS Take one tablet by mouth every 4 hours as needed for pain   Last Refilled: 09/14/2010 Call when ready @ [phone] Can pt pick up on Thursday because of holiday   Method Requested: Mail to Patient Initial call taken by: Merrie Roof RN,  October 10, 2010 11:45 AM  Follow-up for Phone Call        Rx was printed and patient was called for a pick up. Thank you. Follow-up by: Deatra Robinson MD,  October 10, 2010 12:02 PM    Prescriptions: OXYCODONE HCL 10 MG TABS (OXYCODONE HCL) Take one tablet by mouth every 4 hours as needed for pain  #120 x 0   Entered and Authorized by:   Deatra Robinson MD   Signed by:   Deatra Robinson MD on 10/10/2010   Method used:   Print then Give to Patient   RxID:   [id number]

## 2010-11-16 NOTE — Progress Notes (Addendum)
Summary: Medication.  Phone Note Call from Patient   Caller: Patient Summary of Call: Pt here stating that he is out of refills on his Xanex.  Pt was informed that his Xanax had been discontinued and changed to Ativan.  Pt stated that he has been getting refills monthly on the Xanex. Said that the Ativan is not working and wants a refill on the [place].  Pt presented with his empty bottles of the Xanex .  To discuss with the Attending about the refill. Angelina Ok RN  November 10, 2010 11:45 AM  Initial call taken by: Angelina Ok RN,  November 10, 2010 11:45 AM  Follow-up for Phone Call        Provider Notified. Please, have the patient to come in to pick up a new Rx for Xanax. Will disconitnue Ativan. Thank you. Follow-up by: Deatra Robinson MD,  November 10, 2010 7:31 PM    New/Updated Medications: ALPRAZOLAM 1 MG TABS (ALPRAZOLAM) Take one tablet two times a day as needed for anxiety Prescriptions: ALPRAZOLAM 1 MG TABS (ALPRAZOLAM) Take one tablet two times a day as needed for anxiety  #60 x 0   Entered and Authorized by:   Deatra Robinson MD   Signed by:   Deatra Robinson MD on 11/10/2010   Method used:   Historical   RxID:   [id number]

## 2010-11-22 NOTE — Progress Notes (Signed)
Summary: Medication  Phone Note Outgoing Call   Call placed by: Angelina Ok RN,  November 13, 2010 10:29 AM Call placed to: Specialist Summary of Call: Call to [facility] Prescription fro Ativan was cancelled and Prescription for Ativan 1 mg tablets 1 by mouth two times a day for anxiety. Dispense # 60 with no refills.  Per order of Dr. Denton Meek. Initial call taken by: Angelina Ok RN,  November 13, 2010 10:31 AM  Follow-up for Phone Call        Provider notified Follow-up by: Deatra Robinson MD,  November 14, 2010 9:52 AM

## 2010-11-22 NOTE — Assessment & Plan Note (Signed)
Summary: 6WK F/U [MKJ]   Visit Type:  Follow-up Primary Provider:  Deatra Robinson MD  CC:  f/u.  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 He was later found on imaging found to have evidence of sternoclavicular septic arthrit. The patient developed subsequent worsening pain about his clavice and followup MRI showed worsening of apparent [place] septic arthritis. I arranged for CT guided aspiration of the clavicle and this failed to yield an organism on culture. I had him admitted to the B service and reinitiated IV antibiotics with vancomycin to cover him for [facility] septic arthritis and to do so with less risk of inducing recurrnece of his c diff colitis (vs carbapenem). Of note his ESR anc CRP were normal on check during that hospitalization. He finished his course of iv vancomycin and was seen in followpu by me last month. He complains of pain in his shoulders, hips and "all over." He is depressed having lost his brother last month due to complications related to hep c an cirrhosis.  Preventive Screening-Counseling & Management  Alcohol-Tobacco     Alcohol drinks/day: 0 x 3 months     Smoking Status: quit > 6 months     Year Quit: 34yrs ago   Current Allergies (reviewed today): ! CODEINE ! 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  Unemployed  Risk Factors: Alcohol Use: 0 x 3 months (11/13/2010) Exercise: yes (09/14/2010)  Risk Factors: Smoking Status: quit > 6 months (11/13/2010)  Problems Prior to Update: 1)  Depression  (ICD-311) 2)  Septic Arthritis  (ICD-711.00) 3)  Need Prophylactic Vaccination&inoculation Flu  (ICD-V04.81) 4)  Anxiety Disorder, Generalized  (ICD-300.02) 5)  Syncope  (ICD-780.2) 6)  Clostridium Difficile Colitis  (ICD-008.45) 7)  Discitis  (ICD-722.90) 8)  Hx of Abscess, Epidural  (ICD-324.9) 9)  Elevated Blood Pressure  (ICD-796.2) 10)  Shoulder Pain, Bilateral  (ICD-719.41) 11)  Pain in Joint, Multiple Sites  (ICD-719.49) 12)  Anemia, Hx of  (ICD-V12.3) 13)  Bacteremia  (ICD-790.7) 14)  Renal Failure, Acute, Hx of  (ICD-V13.09) 15)  Aspiration Pneumonia, Right Lower Lobe  (ICD-507.0) 16)  Hepatic Cirrhosis  (ICD-571.5) 17)  Other Fluid Overload  (ICD-276.69) 18)  Hepatitis C Carrier  (ICD-V02.62) 19)  Hepatic Encephalopathy  (ICD-572.2) 20)  Anemia of Other Chronic Disease  (ICD-285.29) 21)  Personal History of Alcoholism  (ICD-V11.3) 22)  Constipation, Drug Induced  (ICD-564.09) 23)  Allergic Rhinitis, Chronic  (ICD-477.9)  Medications Prior to Update: 1)  Prilosec Otc 20 Mg Tbec (Omeprazole Magnesium) .... Take One To Two Tablets By Mouth Daily 2)  Docu Soft 100 Mg Caps (Docusate Sodium) .... Take 1 Tablet By Mouth Two Times A Day By Mouth As Needed 3)  Neurontin 800 Mg Tabs (Gabapentin) .... Take One Tablet By Mouth Three Times A Day 4)  Oxycodone Hcl 10 Mg Tabs (Oxycodone Hcl) .... Take One Tablet By Mouth Every 4 Hours As  Needed For Pain 5)  Alprazolam 1 Mg Tabs (Alprazolam) .... Take One Tablet Two Times A Day As Needed For Anxiety 6)  Vancomycin Hcl 1000 Mg Solr (Vancomycin Hcl) .... Dosed For Trough 15-20 Initial Dosing: 1gm Iv Q8hr 7)  Zoloft 50 Mg Tabs (Sertraline Hcl) .... Take One Tablet By Mouth Daily  Current Medications (verified): 1)  Prilosec Otc 20 Mg Tbec  (Omeprazole Magnesium) .... Take One To Two Tablets By Mouth Daily 2)  Docu Soft 100 Mg Caps (Docusate Sodium) .... Take 1 Tablet By Mouth Two Times A Day By Mouth As Needed 3)  Neurontin 800 Mg Tabs (Gabapentin) .... Take One Tablet By Mouth Three Times A Day 4)  Oxycodone Hcl 10 Mg Tabs (Oxycodone Hcl) .... Take One Tablet By Mouth Every 4 Hours As Needed For Pain 5)  Alprazolam 1 Mg Tabs (Alprazolam) .... Take One Tablet Two Times A Day As Needed For Anxiety 6)  Zoloft 50 Mg Tabs (Sertraline Hcl) .... Take One Tablet By Mouth Daily  Allergies: 1)  ! Codeine 2)  ! Rocephin (Ceftriaxone Sodium)  Directives: 1)  Full Code    Review of Systems       see HPI otherwise negative on 12 point review  Vital Signs:  Patient profile:   60 year old male Height:      72 inches (182.88 cm) Weight:      186.75 pounds (84.89 kg) BMI:     25.42 Temp:     97.5 degrees F (36.39 degrees C) oral Pulse rate:   76 / minute BP sitting:   135 / 78  (left arm)  Vitals Entered By: Starleen Arms CMA (November 13, 2010 9:44 AM) CC: f/u Is Patient Diabetic? No Pain Assessment Patient in pain? no      Nutritional Status BMI of 25 - 29 = overweight Nutritional Status Detail nl  Does patient need assistance? Functional Status Self care Ambulation Normal   Physical Exam  General:  alert.  multiple tattoos wearing bandannna over head Head:  Normocephalic and atraumatic without obvious abnormalities.  Ears:  no external deformities and ear piercing(s) noted.   Nose:  no external erythema and no nasal discharge.   Mouth:  pharynx pink and moist, no erythema, and no exudates.   Neck:  full ROM.   Lungs:  normal respiratory effort, no crackles, and no wheezes.   Heart:  normal rate, regular rhythm, no murmur, and no gallop.   Abdomen:  soft, non-tender, and normal bowel sounds.   Msk:  he has some tenderness about his Left sternoclavicualr joint, no significant erythema, cervical spine with  some minimal tenderness to palpation Neurologic:  alert & oriented X3 and gait normal.   Skin:  tattoosno petechiae and no purpura.   Psych:  Oriented X3, memory intact for recent and remote, and good eye contact.     Impression & Recommendations:  Problem # 1:  SEPTIC ARTHRITIS (ICD-711.00) I will check an ESR and CRP today. I think we have successfully cleared this infection The following medications were removed from the medication list:    Vancomycin Hcl 1000 Mg Solr (Vancomycin hcl) .Marland Kitchen... Dosed for trough 15-20 initial dosing: 1gm iv q8hr His updated medication list for this problem includes:    Oxycodone Hcl 10 Mg Tabs (Oxycodone hcl) .Marland Kitchen... Take one tablet by mouth every 4 hours as needed for pain  Orders: T-Comprehensive Metabolic Panel [phone]) T-CBC w/Diff ([id number]) T-C-Reactive Protein [id number]) T-Sed Rate (Automated) ([id number]) Est. Patient Level  IV (16109)  Problem # 2:  DISCITIS (ICD-722.90)  see above will check esr and crp  Orders: Est. Patient Level IV (60454)  Problem # 3:  CLOSTRIDIUM DIFFICILE COLITIS (ICD-008.45)  no recurrence  Orders: Est. Patient Level IV (09811)  Problem # 4:  HEPATITIS C CARRIER (ICD-V02.62) check hep c rna, inr, lfts today. WOuld not want to risk IFN on him. When iFN free regimen is available would treat him if he has active hep c still. claims to be clean from etoh Orders: T-Hepatitis C RNA Quant PCR ([id number]) Est. Patient Level IV (13086)  Other Orders: T-Protime, Auto ([id number])  Patient Instructions: 1)  we will check labs today 2)  rtc in 6 months time

## 2010-11-30 ENCOUNTER — Telehealth: Payer: Self-pay | Admitting: Infectious Diseases

## 2010-12-08 ENCOUNTER — Other Ambulatory Visit: Payer: Self-pay | Admitting: *Deleted

## 2010-12-08 MED ORDER — OXYCODONE HCL 10 MG PO TABS: ORAL_TABLET | ORAL | Status: DC

## 2010-12-08 MED ORDER — SERTRALINE HCL 50 MG PO TABS: 50.0000 mg | ORAL_TABLET | Freq: Every day | ORAL | Status: DC

## 2010-12-08 MED ORDER — ALPRAZOLAM 1 MG PO TABS: 1.0000 mg | ORAL_TABLET | Freq: Two times a day (BID) | ORAL | Status: DC

## 2010-12-08 NOTE — Telephone Encounter (Signed)
Pt would like to pick up the prescriptions today.  Lives in [place].  Has transportation for today.  Pt is no longer on the Ativan was discontinued last month.

## 2010-12-18 ENCOUNTER — Encounter: Payer: Self-pay | Admitting: Adult Health

## 2010-12-21 ENCOUNTER — Encounter: Payer: Self-pay | Admitting: *Deleted

## 2010-12-23 ENCOUNTER — Encounter: Payer: Self-pay | Admitting: *Deleted

## 2010-12-26 LAB — DIFFERENTIAL
Basophils Relative: 1 % (ref 0–1)
Eosinophils Absolute: 0.1 10*3/uL (ref 0.0–0.7)
Eosinophils Relative: 2 % (ref 0–5)
Lymphs Abs: 1.6 10*3/uL (ref 0.7–4.0)

## 2010-12-26 LAB — MAGNESIUM: Magnesium: 1.7 mg/dL (ref 1.5–2.5)

## 2010-12-26 LAB — LIPASE, BLOOD: Lipase: 25 U/L (ref 11–59)

## 2010-12-26 LAB — URINALYSIS, ROUTINE W REFLEX MICROSCOPIC
Glucose, UA: NEGATIVE mg/dL
Hgb urine dipstick: NEGATIVE
Specific Gravity, Urine: 1.012 (ref 1.005–1.030)

## 2010-12-26 LAB — HEMOCCULT GUIAC POC 1CARD (OFFICE): Fecal Occult Bld: NEGATIVE

## 2010-12-26 LAB — BASIC METABOLIC PANEL
BUN: 6 mg/dL (ref 6–23)
CO2: 24 mEq/L (ref 19–32)
CO2: 25 mEq/L (ref 19–32)
Calcium: 8.1 mg/dL — ABNORMAL LOW (ref 8.4–10.5)
Calcium: 8.7 mg/dL (ref 8.4–10.5)
Calcium: 8.7 mg/dL (ref 8.4–10.5)
Chloride: 108 mEq/L (ref 96–112)
Creatinine, Ser: 0.8 mg/dL (ref 0.4–1.5)
Creatinine, Ser: 0.84 mg/dL (ref 0.4–1.5)
GFR calc Af Amer: >60 mL/min (ref >60)
GFR calc Af Amer: >60 mL/min (ref >60)
GFR calc non Af Amer: >60 mL/min (ref >60)
Glucose, Bld: 81 mg/dL (ref 70–99)
Glucose, Bld: 98 mg/dL (ref 70–99)
Sodium: 137 mEq/L (ref 135–145)

## 2010-12-26 LAB — CBC
Hemoglobin: 9.7 g/dL — ABNORMAL LOW (ref 13.0–17.0)
MCH: 30.5 pg (ref 26.0–34.0)
MCH: 31 pg (ref 26.0–34.0)
MCHC: 33.7 g/dL (ref 30.0–36.0)
MCHC: 34.8 g/dL (ref 30.0–36.0)
MCHC: 34.9 g/dL (ref 30.0–36.0)
MCV: 88.8 fL (ref 78.0–100.0)
Platelets: 76 10*3/uL — ABNORMAL LOW (ref 150–400)
Platelets: 81 10*3/uL — ABNORMAL LOW (ref 150–400)
RBC: 3.07 MIL/uL — ABNORMAL LOW (ref 4.22–5.81)
RBC: 3.65 MIL/uL — ABNORMAL LOW (ref 4.22–5.81)
RDW: 14.8 % (ref 11.5–15.5)

## 2010-12-26 LAB — HEPATIC FUNCTION PANEL
Alkaline Phosphatase: 30 U/L — ABNORMAL LOW (ref 39–117)
Indirect Bilirubin: 1.2 mg/dL — ABNORMAL HIGH (ref 0.3–0.9)
Total Bilirubin: 1.7 mg/dL — ABNORMAL HIGH (ref 0.3–1.2)
Total Protein: 6.6 g/dL (ref 6.0–8.3)

## 2010-12-26 LAB — CLOSTRIDIUM DIFFICILE EIA: C difficile Toxins A+B, EIA: NEGATIVE

## 2010-12-26 LAB — MRSA PCR SCREENING: MRSA by PCR: NEGATIVE

## 2010-12-26 NOTE — Miscellaneous (Signed)
Summary: [facility]  [facility]   Imported By: Florinda Marker 12/21/2010 08:56:03  _____________________________________________________________________  External Attachment:    Type:   Image     Comment:   External Document

## 2010-12-27 LAB — CULTURE, BLOOD (ROUTINE X 2): Culture: NO GROWTH

## 2010-12-27 LAB — BASIC METABOLIC PANEL
BUN: 6 mg/dL (ref 6–23)
Chloride: 110 mEq/L (ref 96–112)
GFR calc Af Amer: >60 mL/min (ref >60)
Potassium: 3.6 mEq/L (ref 3.5–5.1)

## 2010-12-27 LAB — RAPID URINE DRUG SCREEN, HOSP PERFORMED
Barbiturates: NOT DETECTED
Benzodiazepines: POSITIVE — AB
Cocaine: NOT DETECTED

## 2010-12-27 LAB — CBC
HCT: 31.2 % — ABNORMAL LOW (ref 39.0–52.0)
HCT: 36.6 % — ABNORMAL LOW (ref 39.0–52.0)
Hemoglobin: 11.5 g/dL — ABNORMAL LOW (ref 13.0–17.0)
MCH: 31.3 pg (ref 26.0–34.0)
MCHC: 34.2 g/dL (ref 30.0–36.0)
MCV: 90.1 fL (ref 78.0–100.0)
MCV: 91.6 fL (ref 78.0–100.0)
MCV: 93.4 fL (ref 78.0–100.0)
Platelets: 109 10*3/uL — ABNORMAL LOW (ref 150–400)
RBC: 3.34 MIL/uL — ABNORMAL LOW (ref 4.22–5.81)
RBC: 4.06 MIL/uL — ABNORMAL LOW (ref 4.22–5.81)
WBC: 3.4 10*3/uL — ABNORMAL LOW (ref 4.0–10.5)
WBC: 5.6 10*3/uL (ref 4.0–10.5)

## 2010-12-27 LAB — URINALYSIS, ROUTINE W REFLEX MICROSCOPIC
Glucose, UA: NEGATIVE mg/dL
Nitrite: NEGATIVE
pH: 7 (ref 5.0–8.0)

## 2010-12-27 LAB — COMPREHENSIVE METABOLIC PANEL
BUN: 6 mg/dL (ref 6–23)
CO2: 24 mEq/L (ref 19–32)
Calcium: 9.2 mg/dL (ref 8.4–10.5)
Creatinine, Ser: 0.71 mg/dL (ref 0.4–1.5)
GFR calc Af Amer: >60 mL/min (ref >60)
GFR calc non Af Amer: >60 mL/min (ref >60)
Glucose, Bld: 106 mg/dL — ABNORMAL HIGH (ref 70–99)

## 2010-12-27 LAB — VITAMIN B12: Vitamin B-12: 1127 pg/mL — ABNORMAL HIGH (ref 211–911)

## 2010-12-27 LAB — SEDIMENTATION RATE
Sed Rate: 14 mm/hr (ref 0–16)
Sed Rate: 19 mm/hr — ABNORMAL HIGH (ref 0–16)

## 2010-12-27 LAB — DIFFERENTIAL
Lymphocytes Relative: 21 % (ref 12–46)
Lymphs Abs: 1.1 10*3/uL (ref 0.7–4.0)
Neutro Abs: 3.7 10*3/uL (ref 1.7–7.7)
Neutrophils Relative %: 70 % (ref 43–77)

## 2010-12-27 LAB — TSH
TSH: 0.318 u[IU]/mL — ABNORMAL LOW (ref 0.350–4.500)
TSH: 0.495 u[IU]/mL (ref 0.350–4.500)

## 2010-12-27 LAB — BODY FLUID CULTURE: Culture: NO GROWTH

## 2010-12-27 LAB — APTT
aPTT: 34 seconds (ref 24–37)
aPTT: 35 seconds (ref 24–37)

## 2010-12-29 ENCOUNTER — Telehealth: Payer: Self-pay | Admitting: *Deleted

## 2010-12-29 LAB — CLOSTRIDIUM DIFFICILE EIA

## 2010-12-29 LAB — BASIC METABOLIC PANEL
BUN: 3 mg/dL — ABNORMAL LOW (ref 6–23)
Calcium: 8.2 mg/dL — ABNORMAL LOW (ref 8.4–10.5)
Chloride: 105 mEq/L (ref 96–112)
Chloride: 106 mEq/L (ref 96–112)
Creatinine, Ser: 0.71 mg/dL (ref 0.4–1.5)
Creatinine, Ser: 0.72 mg/dL (ref 0.4–1.5)
GFR calc Af Amer: >60 mL/min (ref >60)
GFR calc Af Amer: >60 mL/min (ref >60)
GFR calc non Af Amer: >60 mL/min (ref >60)
Potassium: 2.8 mEq/L — ABNORMAL LOW (ref 3.5–5.1)
Potassium: 3.4 mEq/L — ABNORMAL LOW (ref 3.5–5.1)

## 2010-12-29 LAB — CBC
HCT: 26.3 % — ABNORMAL LOW (ref 39.0–52.0)
HCT: 27 % — ABNORMAL LOW (ref 39.0–52.0)
MCHC: 32.7 g/dL (ref 30.0–36.0)
MCV: 89.4 fL (ref 78.0–100.0)
Platelets: 114 10*3/uL — ABNORMAL LOW (ref 150–400)
Platelets: 142 10*3/uL — ABNORMAL LOW (ref 150–400)
RBC: 3.02 MIL/uL — ABNORMAL LOW (ref 4.22–5.81)
RDW: 15.8 % — ABNORMAL HIGH (ref 11.5–15.5)
WBC: 3.5 10*3/uL — ABNORMAL LOW (ref 4.0–10.5)
WBC: 6.1 10*3/uL (ref 4.0–10.5)

## 2010-12-29 LAB — GIARDIA/CRYPTOSPORIDIUM SCREEN(EIA): Giardia Screen - EIA: NEGATIVE

## 2010-12-29 LAB — FECAL LACTOFERRIN, QUANT

## 2010-12-29 LAB — AMMONIA: Ammonia: 34 umol/L (ref 11–35)

## 2010-12-29 NOTE — Telephone Encounter (Signed)
Pt called and states he has been getting nervous since his xanax was decreased. He gets xanax 1 mg bid.  He used to get this 4 times a day.  Will you increase?  I am scheduling him for an appointment next week.

## 2010-12-30 LAB — BASIC METABOLIC PANEL
BUN: 10 mg/dL (ref 6–23)
BUN: 3 mg/dL — ABNORMAL LOW (ref 6–23)
BUN: 4 mg/dL — ABNORMAL LOW (ref 6–23)
BUN: 4 mg/dL — ABNORMAL LOW (ref 6–23)
BUN: 4 mg/dL — ABNORMAL LOW (ref 6–23)
BUN: 5 mg/dL — ABNORMAL LOW (ref 6–23)
BUN: 7 mg/dL (ref 6–23)
BUN: 8 mg/dL (ref 6–23)
CO2: 27 mEq/L (ref 19–32)
CO2: 27 mEq/L (ref 19–32)
CO2: 28 mEq/L (ref 19–32)
CO2: 28 mEq/L (ref 19–32)
CO2: 30 mEq/L (ref 19–32)
CO2: 30 mEq/L (ref 19–32)
Calcium: 8.2 mg/dL — ABNORMAL LOW (ref 8.4–10.5)
Calcium: 8.4 mg/dL (ref 8.4–10.5)
Calcium: 8.6 mg/dL (ref 8.4–10.5)
Calcium: 8.9 mg/dL (ref 8.4–10.5)
Calcium: 9.1 mg/dL (ref 8.4–10.5)
Chloride: 100 mEq/L (ref 96–112)
Chloride: 100 mEq/L (ref 96–112)
Chloride: 100 mEq/L (ref 96–112)
Chloride: 101 mEq/L (ref 96–112)
Chloride: 104 mEq/L (ref 96–112)
Chloride: 97 mEq/L (ref 96–112)
Chloride: 99 mEq/L (ref 96–112)
Chloride: 99 mEq/L (ref 96–112)
Chloride: 99 mEq/L (ref 96–112)
Chloride: 99 mEq/L (ref 96–112)
Creatinine, Ser: 0.55 mg/dL (ref 0.4–1.5)
Creatinine, Ser: 0.66 mg/dL (ref 0.4–1.5)
Creatinine, Ser: 0.67 mg/dL (ref 0.4–1.5)
Creatinine, Ser: 0.68 mg/dL (ref 0.4–1.5)
Creatinine, Ser: 0.76 mg/dL (ref 0.4–1.5)
GFR calc Af Amer: >60 mL/min (ref >60)
GFR calc Af Amer: >60 mL/min (ref >60)
GFR calc Af Amer: >60 mL/min (ref >60)
GFR calc Af Amer: >60 mL/min (ref >60)
GFR calc Af Amer: >60 mL/min (ref >60)
GFR calc Af Amer: >60 mL/min (ref >60)
GFR calc non Af Amer: >60 mL/min (ref >60)
GFR calc non Af Amer: >60 mL/min (ref >60)
GFR calc non Af Amer: >60 mL/min (ref >60)
GFR calc non Af Amer: >60 mL/min (ref >60)
Glucose, Bld: 101 mg/dL — ABNORMAL HIGH (ref 70–99)
Glucose, Bld: 102 mg/dL — ABNORMAL HIGH (ref 70–99)
Glucose, Bld: 107 mg/dL — ABNORMAL HIGH (ref 70–99)
Glucose, Bld: 107 mg/dL — ABNORMAL HIGH (ref 70–99)
Glucose, Bld: 114 mg/dL — ABNORMAL HIGH (ref 70–99)
Glucose, Bld: 93 mg/dL (ref 70–99)
Potassium: 3.1 mEq/L — ABNORMAL LOW (ref 3.5–5.1)
Potassium: 3.4 mEq/L — ABNORMAL LOW (ref 3.5–5.1)
Potassium: 3.5 mEq/L (ref 3.5–5.1)
Potassium: 4 mEq/L (ref 3.5–5.1)
Potassium: 4 mEq/L (ref 3.5–5.1)
Potassium: 4.1 mEq/L (ref 3.5–5.1)
Sodium: 133 mEq/L — ABNORMAL LOW (ref 135–145)
Sodium: 134 mEq/L — ABNORMAL LOW (ref 135–145)
Sodium: 135 mEq/L (ref 135–145)
Sodium: 135 mEq/L (ref 135–145)
Sodium: 137 mEq/L (ref 135–145)
Sodium: 137 mEq/L (ref 135–145)
Sodium: 138 mEq/L (ref 135–145)

## 2010-12-30 LAB — COMPREHENSIVE METABOLIC PANEL
ALT: 43 U/L (ref 0–53)
AST: 77 U/L — ABNORMAL HIGH (ref 0–37)
CO2: 26 mEq/L (ref 19–32)
Chloride: 106 mEq/L (ref 96–112)
GFR calc Af Amer: >60 mL/min (ref >60)
GFR calc non Af Amer: >60 mL/min (ref >60)
Potassium: 3.5 mEq/L (ref 3.5–5.1)
Sodium: 136 mEq/L (ref 135–145)
Total Bilirubin: 0.9 mg/dL (ref 0.3–1.2)

## 2010-12-30 LAB — CBC
HCT: 27.4 % — ABNORMAL LOW (ref 39.0–52.0)
HCT: 27.8 % — ABNORMAL LOW (ref 39.0–52.0)
HCT: 28.2 % — ABNORMAL LOW (ref 39.0–52.0)
HCT: 28.4 % — ABNORMAL LOW (ref 39.0–52.0)
HCT: 29.3 % — ABNORMAL LOW (ref 39.0–52.0)
HCT: 29.5 % — ABNORMAL LOW (ref 39.0–52.0)
HCT: 30.1 % — ABNORMAL LOW (ref 39.0–52.0)
HCT: 31 % — ABNORMAL LOW (ref 39.0–52.0)
HCT: 34.1 % — ABNORMAL LOW (ref 39.0–52.0)
Hemoglobin: 10.5 g/dL — ABNORMAL LOW (ref 13.0–17.0)
Hemoglobin: 10.7 g/dL — ABNORMAL LOW (ref 13.0–17.0)
Hemoglobin: 11.3 g/dL — ABNORMAL LOW (ref 13.0–17.0)
Hemoglobin: 8.9 g/dL — ABNORMAL LOW (ref 13.0–17.0)
Hemoglobin: 9.2 g/dL — ABNORMAL LOW (ref 13.0–17.0)
Hemoglobin: 9.9 g/dL — ABNORMAL LOW (ref 13.0–17.0)
MCH: 30.9 pg (ref 26.0–34.0)
MCH: 31.2 pg (ref 26.0–34.0)
MCH: 31.2 pg (ref 26.0–34.0)
MCH: 31.7 pg (ref 26.0–34.0)
MCH: 31.8 pg (ref 26.0–34.0)
MCH: 32.2 pg (ref 26.0–34.0)
MCH: 32.2 pg (ref 26.0–34.0)
MCHC: 33.1 g/dL (ref 30.0–36.0)
MCHC: 33.7 g/dL (ref 30.0–36.0)
MCHC: 33.7 g/dL (ref 30.0–36.0)
MCHC: 34 g/dL (ref 30.0–36.0)
MCHC: 34.5 g/dL (ref 30.0–36.0)
MCV: 91.7 fL (ref 78.0–100.0)
MCV: 92.2 fL (ref 78.0–100.0)
MCV: 92.4 fL (ref 78.0–100.0)
MCV: 92.5 fL (ref 78.0–100.0)
MCV: 92.6 fL (ref 78.0–100.0)
MCV: 92.6 fL (ref 78.0–100.0)
MCV: 92.6 fL (ref 78.0–100.0)
MCV: 92.6 fL (ref 78.0–100.0)
MCV: 92.8 fL (ref 78.0–100.0)
MCV: 92.9 fL (ref 78.0–100.0)
MCV: 93.4 fL (ref 78.0–100.0)
MCV: 93.5 fL (ref 78.0–100.0)
Platelets: 107 10*3/uL — ABNORMAL LOW (ref 150–400)
Platelets: 123 10*3/uL — ABNORMAL LOW (ref 150–400)
Platelets: 156 10*3/uL (ref 150–400)
Platelets: 166 10*3/uL (ref 150–400)
Platelets: 181 10*3/uL (ref 150–400)
Platelets: 88 10*3/uL — ABNORMAL LOW (ref 150–400)
Platelets: 89 10*3/uL — ABNORMAL LOW (ref 150–400)
RBC: 2.87 MIL/uL — ABNORMAL LOW (ref 4.22–5.81)
RBC: 2.93 MIL/uL — ABNORMAL LOW (ref 4.22–5.81)
RBC: 3.05 MIL/uL — ABNORMAL LOW (ref 4.22–5.81)
RBC: 3.12 MIL/uL — ABNORMAL LOW (ref 4.22–5.81)
RBC: 3.19 MIL/uL — ABNORMAL LOW (ref 4.22–5.81)
RBC: 3.25 MIL/uL — ABNORMAL LOW (ref 4.22–5.81)
RBC: 3.36 MIL/uL — ABNORMAL LOW (ref 4.22–5.81)
RBC: 3.37 MIL/uL — ABNORMAL LOW (ref 4.22–5.81)
RDW: 16.4 % — ABNORMAL HIGH (ref 11.5–15.5)
RDW: 16.6 % — ABNORMAL HIGH (ref 11.5–15.5)
RDW: 16.7 % — ABNORMAL HIGH (ref 11.5–15.5)
RDW: 17.6 % — ABNORMAL HIGH (ref 11.5–15.5)
RDW: 17.9 % — ABNORMAL HIGH (ref 11.5–15.5)
WBC: 10.5 10*3/uL (ref 4.0–10.5)
WBC: 12.4 10*3/uL — ABNORMAL HIGH (ref 4.0–10.5)
WBC: 12.6 10*3/uL — ABNORMAL HIGH (ref 4.0–10.5)
WBC: 5 10*3/uL (ref 4.0–10.5)
WBC: 5 10*3/uL (ref 4.0–10.5)
WBC: 6.1 10*3/uL (ref 4.0–10.5)
WBC: 7.4 10*3/uL (ref 4.0–10.5)
WBC: 7.8 10*3/uL (ref 4.0–10.5)
WBC: 8.1 10*3/uL (ref 4.0–10.5)
WBC: 9.5 10*3/uL (ref 4.0–10.5)

## 2010-12-30 LAB — DIFFERENTIAL
Basophils Absolute: 0 10*3/uL (ref 0.0–0.1)
Basophils Absolute: 0.1 10*3/uL (ref 0.0–0.1)
Basophils Absolute: 0.1 10*3/uL (ref 0.0–0.1)
Basophils Absolute: 0.1 10*3/uL (ref 0.0–0.1)
Basophils Relative: 0 % (ref 0–1)
Basophils Relative: 1 % (ref 0–1)
Basophils Relative: 1 % (ref 0–1)
Eosinophils Absolute: 0.1 10*3/uL (ref 0.0–0.7)
Eosinophils Absolute: 0.2 10*3/uL (ref 0.0–0.7)
Eosinophils Absolute: 0.2 10*3/uL (ref 0.0–0.7)
Eosinophils Absolute: 0.3 10*3/uL (ref 0.0–0.7)
Eosinophils Absolute: 0.4 10*3/uL (ref 0.0–0.7)
Eosinophils Relative: 1 % (ref 0–5)
Eosinophils Relative: 2 % (ref 0–5)
Eosinophils Relative: 2 % (ref 0–5)
Eosinophils Relative: 2 % (ref 0–5)
Eosinophils Relative: 3 % (ref 0–5)
Eosinophils Relative: 4 % (ref 0–5)
Lymphocytes Relative: 24 % (ref 12–46)
Lymphocytes Relative: 25 % (ref 12–46)
Lymphocytes Relative: 26 % (ref 12–46)
Lymphocytes Relative: 28 % (ref 12–46)
Lymphs Abs: 2 10*3/uL (ref 0.7–4.0)
Lymphs Abs: 3.1 10*3/uL (ref 0.7–4.0)
Monocytes Absolute: 0.7 10*3/uL (ref 0.1–1.0)
Monocytes Absolute: 0.8 10*3/uL (ref 0.1–1.0)
Monocytes Absolute: 1.4 10*3/uL — ABNORMAL HIGH (ref 0.1–1.0)
Monocytes Absolute: 1.5 10*3/uL — ABNORMAL HIGH (ref 0.1–1.0)
Monocytes Relative: 11 % (ref 3–12)
Monocytes Relative: 8 % (ref 3–12)
Monocytes Relative: 8 % (ref 3–12)
Neutro Abs: 4.4 10*3/uL (ref 1.7–7.7)
Neutro Abs: 5 10*3/uL (ref 1.7–7.7)
Neutro Abs: 7.8 10*3/uL — ABNORMAL HIGH (ref 1.7–7.7)
Neutrophils Relative %: 62 % (ref 43–77)
Neutrophils Relative %: 69 % (ref 43–77)

## 2010-12-30 LAB — PROTIME-INR: Prothrombin Time: 16.7 seconds — ABNORMAL HIGH (ref 11.6–15.2)

## 2010-12-30 LAB — GLUCOSE, CAPILLARY

## 2010-12-30 LAB — APTT: aPTT: 35 seconds (ref 24–37)

## 2010-12-30 LAB — AMMONIA: Ammonia: 35 umol/L (ref 11–35)

## 2010-12-30 LAB — VANCOMYCIN, TROUGH: Vancomycin Tr: 10.7 ug/mL (ref 10.0–20.0)

## 2010-12-31 LAB — POCT CARDIAC MARKERS
CKMB, poc: <1 ng/mL — ABNORMAL LOW (ref 1.0–8.0)
CKMB, poc: <1 ng/mL — ABNORMAL LOW (ref 1.0–8.0)
Myoglobin, poc: 72.4 ng/mL (ref 12–200)
Troponin i, poc: <0.05 ng/mL (ref 0.00–0.09)

## 2010-12-31 LAB — DIFFERENTIAL
Basophils Absolute: 0 10*3/uL (ref 0.0–0.1)
Lymphocytes Relative: 18 % (ref 12–46)
Monocytes Absolute: 0.4 10*3/uL (ref 0.1–1.0)
Neutro Abs: 4.3 10*3/uL (ref 1.7–7.7)
Neutrophils Relative %: 72 % (ref 43–77)

## 2010-12-31 LAB — RETICULOCYTES
RBC.: 2.94 MIL/uL — ABNORMAL LOW (ref 4.22–5.81)
Retic Count, Absolute: 38.2 10*3/uL (ref 19.0–186.0)

## 2010-12-31 LAB — CBC
HCT: 28.2 % — ABNORMAL LOW (ref 39.0–52.0)
Hemoglobin: 9.9 g/dL — ABNORMAL LOW (ref 13.0–17.0)
Hemoglobin: 7 g/dL — ABNORMAL LOW (ref 13.0–17.0)
MCH: 32.3 pg (ref 26.0–34.0)
MCHC: 35.1 g/dL (ref 30.0–36.0)
MCHC: 34.4 g/dL (ref 30.0–36.0)
MCHC: 35 g/dL (ref 30.0–36.0)
MCV: 97.9 fL (ref 78.0–100.0)
RBC: 2.09 MIL/uL — ABNORMAL LOW (ref 4.22–5.81)
RBC: 2.73 MIL/uL — ABNORMAL LOW (ref 4.22–5.81)
WBC: 3.5 10*3/uL — ABNORMAL LOW (ref 4.0–10.5)

## 2010-12-31 LAB — CULTURE, BLOOD (ROUTINE X 2): Culture: NO GROWTH

## 2010-12-31 LAB — RAPID URINE DRUG SCREEN, HOSP PERFORMED
Cocaine: NOT DETECTED
Opiates: POSITIVE — AB

## 2010-12-31 LAB — GLUCOSE, CAPILLARY
Glucose-Capillary: 114 mg/dL — ABNORMAL HIGH (ref 70–99)
Glucose-Capillary: 117 mg/dL — ABNORMAL HIGH (ref 70–99)
Glucose-Capillary: 118 mg/dL — ABNORMAL HIGH (ref 70–99)

## 2010-12-31 LAB — URINE CULTURE
Colony Count: NO GROWTH
Culture: NO GROWTH

## 2010-12-31 LAB — URINALYSIS, ROUTINE W REFLEX MICROSCOPIC
Ketones, ur: NEGATIVE mg/dL
Leukocytes, UA: NEGATIVE
Nitrite: NEGATIVE
Protein, ur: NEGATIVE mg/dL
Urobilinogen, UA: 1 mg/dL (ref 0.0–1.0)

## 2010-12-31 LAB — VITAMIN B12: Vitamin B-12: 1068 pg/mL — ABNORMAL HIGH (ref 211–911)

## 2010-12-31 LAB — POCT I-STAT, CHEM 8
Chloride: 104 mEq/L (ref 96–112)
Glucose, Bld: 103 mg/dL — ABNORMAL HIGH (ref 70–99)
HCT: 29 % — ABNORMAL LOW (ref 39.0–52.0)
Potassium: 3.6 mEq/L (ref 3.5–5.1)
Sodium: 138 mEq/L (ref 135–145)

## 2010-12-31 LAB — CK TOTAL AND CKMB (NOT AT ARMC): Total CK: 62 U/L (ref 7–232)

## 2010-12-31 LAB — BASIC METABOLIC PANEL
CO2: 31 mEq/L (ref 19–32)
Calcium: 8 mg/dL — ABNORMAL LOW (ref 8.4–10.5)
GFR calc Af Amer: >60 mL/min (ref >60)
Sodium: 134 mEq/L — ABNORMAL LOW (ref 135–145)

## 2010-12-31 LAB — IRON AND TIBC: Iron: 46 ug/dL (ref 42–135)

## 2010-12-31 LAB — PREPARE RBC (CROSSMATCH)

## 2011-01-01 LAB — BASIC METABOLIC PANEL
BUN: 11 mg/dL (ref 6–23)
BUN: 16 mg/dL (ref 6–23)
BUN: 30 mg/dL — ABNORMAL HIGH (ref 6–23)
BUN: 5 mg/dL — ABNORMAL LOW (ref 6–23)
BUN: 53 mg/dL — ABNORMAL HIGH (ref 6–23)
BUN: 68 mg/dL — ABNORMAL HIGH (ref 6–23)
BUN: 98 mg/dL — ABNORMAL HIGH (ref 6–23)
BUN: 23 mg/dL (ref 6–23)
BUN: 30 mg/dL — ABNORMAL HIGH (ref 6–23)
BUN: 34 mg/dL — ABNORMAL HIGH (ref 6–23)
BUN: 47 mg/dL — ABNORMAL HIGH (ref 6–23)
BUN: 69 mg/dL — ABNORMAL HIGH (ref 6–23)
CO2: 27 mEq/L (ref 19–32)
CO2: 27 mEq/L (ref 19–32)
CO2: 30 mEq/L (ref 19–32)
CO2: 32 mEq/L (ref 19–32)
CO2: 20 mEq/L (ref 19–32)
CO2: 21 mEq/L (ref 19–32)
CO2: 23 mEq/L (ref 19–32)
Calcium: 7.6 mg/dL — ABNORMAL LOW (ref 8.4–10.5)
Calcium: 7.6 mg/dL — ABNORMAL LOW (ref 8.4–10.5)
Calcium: 7.7 mg/dL — ABNORMAL LOW (ref 8.4–10.5)
Calcium: 7.1 mg/dL — ABNORMAL LOW (ref 8.4–10.5)
Calcium: 8 mg/dL — ABNORMAL LOW (ref 8.4–10.5)
Chloride: 101 mEq/L (ref 96–112)
Chloride: 103 mEq/L (ref 96–112)
Chloride: 103 mEq/L (ref 96–112)
Chloride: 106 mEq/L (ref 96–112)
Chloride: 108 mEq/L (ref 96–112)
Chloride: 105 mEq/L (ref 96–112)
Chloride: 108 mEq/L (ref 96–112)
Creatinine, Ser: 0.93 mg/dL (ref 0.4–1.5)
Creatinine, Ser: 0.99 mg/dL (ref 0.4–1.5)
Creatinine, Ser: 1.42 mg/dL (ref 0.4–1.5)
Creatinine, Ser: 1.68 mg/dL — ABNORMAL HIGH (ref 0.4–1.5)
Creatinine, Ser: 0.89 mg/dL (ref 0.4–1.5)
Creatinine, Ser: 1.15 mg/dL (ref 0.4–1.5)
Creatinine, Ser: 1.58 mg/dL — ABNORMAL HIGH (ref 0.4–1.5)
Creatinine, Ser: 2.1 mg/dL — ABNORMAL HIGH (ref 0.4–1.5)
Creatinine, Ser: 2.17 mg/dL — ABNORMAL HIGH (ref 0.4–1.5)
Creatinine, Ser: 2.43 mg/dL — ABNORMAL HIGH (ref 0.4–1.5)
GFR calc Af Amer: 51 mL/min — ABNORMAL LOW (ref >60)
GFR calc Af Amer: >60 mL/min (ref >60)
GFR calc Af Amer: >60 mL/min (ref >60)
GFR calc Af Amer: 55 mL/min — ABNORMAL LOW (ref >60)
GFR calc non Af Amer: 28 mL/min — ABNORMAL LOW (ref >60)
GFR calc non Af Amer: 42 mL/min — ABNORMAL LOW (ref >60)
GFR calc non Af Amer: 51 mL/min — ABNORMAL LOW (ref >60)
GFR calc non Af Amer: >60 mL/min (ref >60)
GFR calc non Af Amer: >60 mL/min (ref >60)
GFR calc non Af Amer: 31 mL/min — ABNORMAL LOW (ref >60)
GFR calc non Af Amer: 45 mL/min — ABNORMAL LOW (ref >60)
GFR calc non Af Amer: >60 mL/min (ref >60)
GFR calc non Af Amer: >60 mL/min (ref >60)
Glucose, Bld: 102 mg/dL — ABNORMAL HIGH (ref 70–99)
Glucose, Bld: 81 mg/dL (ref 70–99)
Glucose, Bld: 87 mg/dL (ref 70–99)
Glucose, Bld: 98 mg/dL (ref 70–99)
Glucose, Bld: 123 mg/dL — ABNORMAL HIGH (ref 70–99)
Glucose, Bld: 140 mg/dL — ABNORMAL HIGH (ref 70–99)
Glucose, Bld: 77 mg/dL (ref 70–99)
Glucose, Bld: 77 mg/dL (ref 70–99)
Potassium: 3.4 mEq/L — ABNORMAL LOW (ref 3.5–5.1)
Potassium: 3.5 mEq/L (ref 3.5–5.1)
Potassium: 3.7 mEq/L (ref 3.5–5.1)
Potassium: 3.8 mEq/L (ref 3.5–5.1)
Potassium: 3 mEq/L — ABNORMAL LOW (ref 3.5–5.1)
Potassium: 4.3 mEq/L (ref 3.5–5.1)
Potassium: 4.5 mEq/L (ref 3.5–5.1)
Sodium: 132 mEq/L — ABNORMAL LOW (ref 135–145)
Sodium: 133 mEq/L — ABNORMAL LOW (ref 135–145)
Sodium: 138 mEq/L (ref 135–145)
Sodium: 140 mEq/L (ref 135–145)
Sodium: 147 mEq/L — ABNORMAL HIGH (ref 135–145)
Sodium: 134 mEq/L — ABNORMAL LOW (ref 135–145)

## 2011-01-01 LAB — POCT I-STAT, CHEM 8
Chloride: 92 mEq/L — ABNORMAL LOW (ref 96–112)
HCT: 37 % — ABNORMAL LOW (ref 39.0–52.0)
Hemoglobin: 12.6 g/dL — ABNORMAL LOW (ref 13.0–17.0)
Potassium: 3.3 mEq/L — ABNORMAL LOW (ref 3.5–5.1)
Sodium: 127 mEq/L — ABNORMAL LOW (ref 135–145)

## 2011-01-01 LAB — CBC
HCT: 20.6 % — ABNORMAL LOW (ref 39.0–52.0)
HCT: 21 % — ABNORMAL LOW (ref 39.0–52.0)
HCT: 21.3 % — ABNORMAL LOW (ref 39.0–52.0)
HCT: 22.5 % — ABNORMAL LOW (ref 39.0–52.0)
HCT: 23.3 % — ABNORMAL LOW (ref 39.0–52.0)
HCT: 28.5 % — ABNORMAL LOW (ref 39.0–52.0)
HCT: 29.8 % — ABNORMAL LOW (ref 39.0–52.0)
HCT: 31 % — ABNORMAL LOW (ref 39.0–52.0)
HCT: 32.1 % — ABNORMAL LOW (ref 39.0–52.0)
HCT: 33.5 % — ABNORMAL LOW (ref 39.0–52.0)
HCT: 35.1 % — ABNORMAL LOW (ref 39.0–52.0)
Hemoglobin: 7.3 g/dL — ABNORMAL LOW (ref 13.0–17.0)
Hemoglobin: 7.4 g/dL — ABNORMAL LOW (ref 13.0–17.0)
Hemoglobin: 7.6 g/dL — ABNORMAL LOW (ref 13.0–17.0)
Hemoglobin: 7.7 g/dL — ABNORMAL LOW (ref 13.0–17.0)
Hemoglobin: 8.1 g/dL — ABNORMAL LOW (ref 13.0–17.0)
Hemoglobin: 8.1 g/dL — ABNORMAL LOW (ref 13.0–17.0)
Hemoglobin: 11 g/dL — ABNORMAL LOW (ref 13.0–17.0)
Hemoglobin: 11.1 g/dL — ABNORMAL LOW (ref 13.0–17.0)
MCHC: 33.8 g/dL (ref 30.0–36.0)
MCHC: 34.2 g/dL (ref 30.0–36.0)
MCHC: 34.3 g/dL (ref 30.0–36.0)
MCHC: 34.3 g/dL (ref 30.0–36.0)
MCHC: 34.3 g/dL (ref 30.0–36.0)
MCHC: 34.3 g/dL (ref 30.0–36.0)
MCHC: 34.5 g/dL (ref 30.0–36.0)
MCHC: 34.6 g/dL (ref 30.0–36.0)
MCHC: 34.9 g/dL (ref 30.0–36.0)
MCHC: 35 g/dL (ref 30.0–36.0)
MCHC: 34.3 g/dL (ref 30.0–36.0)
MCHC: 34.3 g/dL (ref 30.0–36.0)
MCHC: 34.5 g/dL (ref 30.0–36.0)
MCHC: 35.3 g/dL (ref 30.0–36.0)
MCHC: 35.7 g/dL (ref 30.0–36.0)
MCV: 100 fL (ref 78.0–100.0)
MCV: 101.4 fL — ABNORMAL HIGH (ref 78.0–100.0)
MCV: 97.2 fL (ref 78.0–100.0)
MCV: 98 fL (ref 78.0–100.0)
MCV: 98 fL (ref 78.0–100.0)
MCV: 98.5 fL (ref 78.0–100.0)
MCV: 99.3 fL (ref 78.0–100.0)
MCV: 99.4 fL (ref 78.0–100.0)
MCV: 99.5 fL (ref 78.0–100.0)
MCV: 97.1 fL (ref 78.0–100.0)
MCV: 97.4 fL (ref 78.0–100.0)
MCV: 97.9 fL (ref 78.0–100.0)
MCV: 98.1 fL (ref 78.0–100.0)
MCV: 98.3 fL (ref 78.0–100.0)
MCV: 98.3 fL (ref 78.0–100.0)
Platelets: 39 10*3/uL — ABNORMAL LOW (ref 150–400)
Platelets: 45 10*3/uL — ABNORMAL LOW (ref 150–400)
Platelets: 45 10*3/uL — ABNORMAL LOW (ref 150–400)
Platelets: 55 10*3/uL — ABNORMAL LOW (ref 150–400)
Platelets: 56 10*3/uL — ABNORMAL LOW (ref 150–400)
Platelets: 60 10*3/uL — ABNORMAL LOW (ref 150–400)
Platelets: 61 10*3/uL — ABNORMAL LOW (ref 150–400)
Platelets: 63 10*3/uL — ABNORMAL LOW (ref 150–400)
Platelets: 67 10*3/uL — ABNORMAL LOW (ref 150–400)
Platelets: 71 10*3/uL — ABNORMAL LOW (ref 150–400)
Platelets: 75 10*3/uL — ABNORMAL LOW (ref 150–400)
Platelets: 39 10*3/uL — ABNORMAL LOW (ref 150–400)
Platelets: 49 10*3/uL — ABNORMAL LOW (ref 150–400)
Platelets: 62 10*3/uL — ABNORMAL LOW (ref 150–400)
Platelets: 77 10*3/uL — ABNORMAL LOW (ref 150–400)
Platelets: 79 10*3/uL — ABNORMAL LOW (ref 150–400)
Platelets: 82 10*3/uL — ABNORMAL LOW (ref 150–400)
RBC: 2.15 MIL/uL — ABNORMAL LOW (ref 4.22–5.81)
RBC: 2.21 MIL/uL — ABNORMAL LOW (ref 4.22–5.81)
RBC: 2.21 MIL/uL — ABNORMAL LOW (ref 4.22–5.81)
RBC: 2.24 MIL/uL — ABNORMAL LOW (ref 4.22–5.81)
RBC: 2.65 MIL/uL — ABNORMAL LOW (ref 4.22–5.81)
RBC: 3.05 MIL/uL — ABNORMAL LOW (ref 4.22–5.81)
RBC: 3.22 MIL/uL — ABNORMAL LOW (ref 4.22–5.81)
RBC: 3.27 MIL/uL — ABNORMAL LOW (ref 4.22–5.81)
RBC: 3.3 MIL/uL — ABNORMAL LOW (ref 4.22–5.81)
RBC: 3.32 MIL/uL — ABNORMAL LOW (ref 4.22–5.81)
RDW: 14.8 % (ref 11.5–15.5)
RDW: 15.2 % (ref 11.5–15.5)
RDW: 15.6 % — ABNORMAL HIGH (ref 11.5–15.5)
RDW: 15.7 % — ABNORMAL HIGH (ref 11.5–15.5)
RDW: 15.8 % — ABNORMAL HIGH (ref 11.5–15.5)
RDW: 16 % — ABNORMAL HIGH (ref 11.5–15.5)
RDW: 16.1 % — ABNORMAL HIGH (ref 11.5–15.5)
RDW: 16.2 % — ABNORMAL HIGH (ref 11.5–15.5)
RDW: 13.6 % (ref 11.5–15.5)
RDW: 14 % (ref 11.5–15.5)
RDW: 14.1 % (ref 11.5–15.5)
RDW: 14.5 % (ref 11.5–15.5)
RDW: 14.8 % (ref 11.5–15.5)
WBC: 10.5 10*3/uL (ref 4.0–10.5)
WBC: 13 10*3/uL — ABNORMAL HIGH (ref 4.0–10.5)
WBC: 13.6 10*3/uL — ABNORMAL HIGH (ref 4.0–10.5)
WBC: 2.1 10*3/uL — ABNORMAL LOW (ref 4.0–10.5)
WBC: 4.6 10*3/uL (ref 4.0–10.5)
WBC: 4.7 10*3/uL (ref 4.0–10.5)
WBC: 6.5 10*3/uL (ref 4.0–10.5)
WBC: 6.8 10*3/uL (ref 4.0–10.5)
WBC: 10.4 10*3/uL (ref 4.0–10.5)
WBC: 16.9 10*3/uL — ABNORMAL HIGH (ref 4.0–10.5)
WBC: 19.1 10*3/uL — ABNORMAL HIGH (ref 4.0–10.5)
WBC: 22.8 10*3/uL — ABNORMAL HIGH (ref 4.0–10.5)

## 2011-01-01 LAB — GLUCOSE, SEROUS FLUID: Glucose, Fluid: 157 mg/dL

## 2011-01-01 LAB — BODY FLUID CELL COUNT WITH DIFFERENTIAL
Lymphs, Fluid: 23 %
Neutrophil Count, Fluid: 36 % — ABNORMAL HIGH (ref 0–25)

## 2011-01-01 LAB — RAPID URINE DRUG SCREEN, HOSP PERFORMED
Amphetamines: NOT DETECTED
Barbiturates: NOT DETECTED
Benzodiazepines: POSITIVE — AB
Cocaine: NOT DETECTED
Opiates: POSITIVE — AB

## 2011-01-01 LAB — URINE MICROSCOPIC-ADD ON

## 2011-01-01 LAB — URINALYSIS, ROUTINE W REFLEX MICROSCOPIC
Ketones, ur: NEGATIVE mg/dL
Ketones, ur: NEGATIVE mg/dL
Nitrite: NEGATIVE
Nitrite: NEGATIVE
Nitrite: POSITIVE — AB
Protein, ur: NEGATIVE mg/dL
Protein, ur: NEGATIVE mg/dL
Specific Gravity, Urine: 1.025 (ref 1.005–1.030)
Urobilinogen, UA: 0.2 mg/dL (ref 0.0–1.0)
Urobilinogen, UA: 1 mg/dL (ref 0.0–1.0)
Urobilinogen, UA: >8 mg/dL — ABNORMAL HIGH (ref 0.0–1.0)
pH: 5.5 (ref 5.0–8.0)

## 2011-01-01 LAB — PREPARE PLATELETS

## 2011-01-01 LAB — DIFFERENTIAL
Basophils Absolute: 0.1 10*3/uL (ref 0.0–0.1)
Basophils Absolute: 0.1 10*3/uL (ref 0.0–0.1)
Basophils Absolute: 0 10*3/uL (ref 0.0–0.1)
Basophils Absolute: 0.3 10*3/uL — ABNORMAL HIGH (ref 0.0–0.1)
Basophils Relative: 0 % (ref 0–1)
Basophils Relative: 1 % (ref 0–1)
Basophils Relative: 0 % (ref 0–1)
Basophils Relative: 2 % — ABNORMAL HIGH (ref 0–1)
Eosinophils Absolute: 0 10*3/uL (ref 0.0–0.7)
Eosinophils Absolute: 0.1 10*3/uL (ref 0.0–0.7)
Eosinophils Absolute: 0 10*3/uL (ref 0.0–0.7)
Eosinophils Relative: 0 % (ref 0–5)
Eosinophils Relative: 0 % (ref 0–5)
Eosinophils Relative: 1 % (ref 0–5)
Lymphs Abs: 1 10*3/uL (ref 0.7–4.0)
Lymphs Abs: 0.2 10*3/uL — ABNORMAL LOW (ref 0.7–4.0)
Lymphs Abs: 0.4 10*3/uL — ABNORMAL LOW (ref 0.7–4.0)
Monocytes Absolute: 0.5 10*3/uL (ref 0.1–1.0)
Monocytes Absolute: 1.2 10*3/uL — ABNORMAL HIGH (ref 0.1–1.0)
Monocytes Relative: 4 % (ref 3–12)
Monocytes Relative: 2 % — ABNORMAL LOW (ref 3–12)
Neutro Abs: 14.7 10*3/uL — ABNORMAL HIGH (ref 1.7–7.7)
Neutrophils Relative %: 75 % (ref 43–77)
Neutrophils Relative %: 93 % — ABNORMAL HIGH (ref 43–77)

## 2011-01-01 LAB — GLUCOSE, CAPILLARY
Glucose-Capillary: 100 mg/dL — ABNORMAL HIGH (ref 70–99)
Glucose-Capillary: 102 mg/dL — ABNORMAL HIGH (ref 70–99)
Glucose-Capillary: 104 mg/dL — ABNORMAL HIGH (ref 70–99)
Glucose-Capillary: 105 mg/dL — ABNORMAL HIGH (ref 70–99)
Glucose-Capillary: 105 mg/dL — ABNORMAL HIGH (ref 70–99)
Glucose-Capillary: 105 mg/dL — ABNORMAL HIGH (ref 70–99)
Glucose-Capillary: 107 mg/dL — ABNORMAL HIGH (ref 70–99)
Glucose-Capillary: 109 mg/dL — ABNORMAL HIGH (ref 70–99)
Glucose-Capillary: 112 mg/dL — ABNORMAL HIGH (ref 70–99)
Glucose-Capillary: 116 mg/dL — ABNORMAL HIGH (ref 70–99)
Glucose-Capillary: 117 mg/dL — ABNORMAL HIGH (ref 70–99)
Glucose-Capillary: 119 mg/dL — ABNORMAL HIGH (ref 70–99)
Glucose-Capillary: 122 mg/dL — ABNORMAL HIGH (ref 70–99)
Glucose-Capillary: 133 mg/dL — ABNORMAL HIGH (ref 70–99)
Glucose-Capillary: 133 mg/dL — ABNORMAL HIGH (ref 70–99)
Glucose-Capillary: 133 mg/dL — ABNORMAL HIGH (ref 70–99)
Glucose-Capillary: 147 mg/dL — ABNORMAL HIGH (ref 70–99)
Glucose-Capillary: 156 mg/dL — ABNORMAL HIGH (ref 70–99)
Glucose-Capillary: 170 mg/dL — ABNORMAL HIGH (ref 70–99)
Glucose-Capillary: 179 mg/dL — ABNORMAL HIGH (ref 70–99)
Glucose-Capillary: 229 mg/dL — ABNORMAL HIGH (ref 70–99)
Glucose-Capillary: 82 mg/dL (ref 70–99)
Glucose-Capillary: 87 mg/dL (ref 70–99)
Glucose-Capillary: 88 mg/dL (ref 70–99)
Glucose-Capillary: 89 mg/dL (ref 70–99)
Glucose-Capillary: 92 mg/dL (ref 70–99)
Glucose-Capillary: 93 mg/dL (ref 70–99)
Glucose-Capillary: 94 mg/dL (ref 70–99)
Glucose-Capillary: 95 mg/dL (ref 70–99)
Glucose-Capillary: 97 mg/dL (ref 70–99)
Glucose-Capillary: 99 mg/dL (ref 70–99)
Glucose-Capillary: 101 mg/dL — ABNORMAL HIGH (ref 70–99)
Glucose-Capillary: 195 mg/dL — ABNORMAL HIGH (ref 70–99)
Glucose-Capillary: 204 mg/dL — ABNORMAL HIGH (ref 70–99)

## 2011-01-01 LAB — COMPREHENSIVE METABOLIC PANEL
ALT: 36 U/L (ref 0–53)
AST: 36 U/L (ref 0–37)
Albumin: 2.4 g/dL — ABNORMAL LOW (ref 3.5–5.2)
Alkaline Phosphatase: 10 U/L — ABNORMAL LOW (ref 39–117)
Alkaline Phosphatase: 11 U/L — ABNORMAL LOW (ref 39–117)
BUN: 127 mg/dL — ABNORMAL HIGH (ref 6–23)
BUN: 36 mg/dL — ABNORMAL HIGH (ref 6–23)
Chloride: 105 mEq/L (ref 96–112)
Creatinine, Ser: 1.92 mg/dL — ABNORMAL HIGH (ref 0.4–1.5)
GFR calc Af Amer: 25 mL/min — ABNORMAL LOW (ref >60)
Glucose, Bld: 83 mg/dL (ref 70–99)
Potassium: 4 mEq/L (ref 3.5–5.1)
Potassium: 3.3 mEq/L — ABNORMAL LOW (ref 3.5–5.1)
Sodium: 137 mEq/L (ref 135–145)
Total Bilirubin: 2.9 mg/dL — ABNORMAL HIGH (ref 0.3–1.2)
Total Protein: 5.7 g/dL — ABNORMAL LOW (ref 6.0–8.3)

## 2011-01-01 LAB — RENAL FUNCTION PANEL
Albumin: 1.9 g/dL — ABNORMAL LOW (ref 3.5–5.2)
Albumin: 2 g/dL — ABNORMAL LOW (ref 3.5–5.2)
BUN: 94 mg/dL — ABNORMAL HIGH (ref 6–23)
Chloride: 109 mEq/L (ref 96–112)
GFR calc Af Amer: 23 mL/min — ABNORMAL LOW (ref >60)
GFR calc non Af Amer: 19 mL/min — ABNORMAL LOW (ref >60)
Phosphorus: 8.4 mg/dL — ABNORMAL HIGH (ref 2.3–4.6)
Potassium: 4.8 mEq/L (ref 3.5–5.1)
Potassium: 4.8 mEq/L (ref 3.5–5.1)
Sodium: 136 mEq/L (ref 135–145)
Sodium: 137 mEq/L (ref 135–145)

## 2011-01-01 LAB — CARDIAC PANEL(CRET KIN+CKTOT+MB+TROPI)
CK, MB: 0.8 ng/mL (ref 0.3–4.0)
CK, MB: 1 ng/mL (ref 0.3–4.0)
CK, MB: 1 ng/mL (ref 0.3–4.0)
CK, MB: 2.1 ng/mL (ref 0.3–4.0)
Relative Index: INVALID (ref 0.0–2.5)
Total CK: 22 U/L (ref 7–232)
Total CK: 22 U/L (ref 7–232)
Total CK: 249 U/L — ABNORMAL HIGH (ref 7–232)
Total CK: 36 U/L (ref 7–232)
Troponin I: 0.97 ng/mL (ref 0.00–0.06)
Troponin I: 1.28 ng/mL (ref 0.00–0.06)

## 2011-01-01 LAB — OSMOLALITY, URINE: Osmolality, Ur: 372 mOsm/kg — ABNORMAL LOW (ref 390–1090)

## 2011-01-01 LAB — CREATININE, SERUM
Creatinine, Ser: 0.89 mg/dL (ref 0.4–1.5)
Creatinine, Ser: 0.95 mg/dL (ref 0.4–1.5)
Creatinine, Ser: 1.23 mg/dL (ref 0.4–1.5)
GFR calc Af Amer: >60 mL/min (ref >60)
GFR calc Af Amer: >60 mL/min (ref >60)
GFR calc non Af Amer: >60 mL/min (ref >60)
GFR calc non Af Amer: >60 mL/min (ref >60)
GFR calc non Af Amer: >60 mL/min (ref >60)
GFR calc non Af Amer: >60 mL/min (ref >60)

## 2011-01-01 LAB — CULTURE, BLOOD (ROUTINE X 2): Culture: NO GROWTH

## 2011-01-01 LAB — TYPE AND SCREEN: ABO/RH(D): A NEG

## 2011-01-01 LAB — PROTIME-INR
INR: 1.54 — ABNORMAL HIGH (ref 0.00–1.49)
INR: 1.71 — ABNORMAL HIGH (ref 0.00–1.49)
INR: 1.49 (ref 0.00–1.49)
INR: 1.67 — ABNORMAL HIGH (ref 0.00–1.49)
INR: 1.81 — ABNORMAL HIGH (ref 0.00–1.49)
Prothrombin Time: 18.4 seconds — ABNORMAL HIGH (ref 11.6–15.2)
Prothrombin Time: 17.9 seconds — ABNORMAL HIGH (ref 11.6–15.2)
Prothrombin Time: 20.8 seconds — ABNORMAL HIGH (ref 11.6–15.2)

## 2011-01-01 LAB — DIC (DISSEMINATED INTRAVASCULAR COAGULATION)PANEL
Fibrinogen: 554 mg/dL — ABNORMAL HIGH (ref 204–475)
INR: 1.79 — ABNORMAL HIGH (ref 0.00–1.49)
Platelets: 39 10*3/uL — ABNORMAL LOW (ref 150–400)
Prothrombin Time: 20.6 seconds — ABNORMAL HIGH (ref 11.6–15.2)
Smear Review: NONE SEEN

## 2011-01-01 LAB — HEPATIC FUNCTION PANEL
ALT: 43 U/L (ref 0–53)
Alkaline Phosphatase: 17 U/L — ABNORMAL LOW (ref 39–117)
Bilirubin, Direct: 1 mg/dL — ABNORMAL HIGH (ref 0.0–0.3)
Bilirubin, Direct: 2.6 mg/dL — ABNORMAL HIGH (ref 0.0–0.3)
Indirect Bilirubin: 1.1 mg/dL — ABNORMAL HIGH (ref 0.3–0.9)
Indirect Bilirubin: 1.6 mg/dL — ABNORMAL HIGH (ref 0.3–0.9)
Total Protein: 6.2 g/dL (ref 6.0–8.3)
Total Protein: 6.5 g/dL (ref 6.0–8.3)

## 2011-01-01 LAB — CLOSTRIDIUM DIFFICILE EIA

## 2011-01-01 LAB — CROSSMATCH

## 2011-01-01 LAB — URINE CULTURE

## 2011-01-01 LAB — HEMOGLOBIN A1C
Hgb A1c MFr Bld: 5.1 % (ref <5.7)
Mean Plasma Glucose: 100 mg/dL (ref <117)

## 2011-01-01 LAB — AMYLASE, BODY FLUID

## 2011-01-01 LAB — D-DIMER, QUANTITATIVE: D-Dimer, Quant: 4.37 ug/mL-FEU — ABNORMAL HIGH (ref 0.00–0.48)

## 2011-01-01 LAB — MISCELLANEOUS TEST

## 2011-01-01 LAB — AFB CULTURE WITH SMEAR (NOT AT ARMC): Acid Fast Smear: NONE SEEN

## 2011-01-01 LAB — MAGNESIUM
Magnesium: 1.4 mg/dL — ABNORMAL LOW (ref 1.5–2.5)
Magnesium: 2.9 mg/dL — ABNORMAL HIGH (ref 1.5–2.5)

## 2011-01-01 LAB — POCT I-STAT 3, ART BLOOD GAS (G3+)
Acid-base deficit: 3 mmol/L — ABNORMAL HIGH (ref 0.0–2.0)
O2 Saturation: 98 %
TCO2: 23 mmol/L (ref 0–100)
pCO2 arterial: 37.2 mmHg (ref 35.0–45.0)
pO2, Arterial: 104 mmHg — ABNORMAL HIGH (ref 80.0–100.0)

## 2011-01-01 LAB — LACTIC ACID, PLASMA
Lactic Acid, Venous: 2.3 mmol/L — ABNORMAL HIGH (ref 0.5–2.2)
Lactic Acid, Venous: 2.6 mmol/L — ABNORMAL HIGH (ref 0.5–2.2)
Lactic Acid, Venous: 5.3 mmol/L — ABNORMAL HIGH (ref 0.5–2.2)

## 2011-01-01 LAB — TSH: TSH: 0.703 u[IU]/mL (ref 0.350–4.500)

## 2011-01-01 LAB — PROTEIN, BODY FLUID: Total protein, fluid: <3 g/dL

## 2011-01-01 LAB — HEMOGLOBIN AND HEMATOCRIT, BLOOD
HCT: 21 % — ABNORMAL LOW (ref 39.0–52.0)
HCT: 22.6 % — ABNORMAL LOW (ref 39.0–52.0)
HCT: 22.8 % — ABNORMAL LOW (ref 39.0–52.0)
HCT: 24.3 % — ABNORMAL LOW (ref 39.0–52.0)
Hemoglobin: 7.2 g/dL — ABNORMAL LOW (ref 13.0–17.0)

## 2011-01-01 LAB — FOLATE: Folate: 9.9 ng/mL

## 2011-01-01 LAB — TOTAL BILIRUBIN, BODY FLUID: Total bilirubin, fluid: 0.1 mg/dL

## 2011-01-01 LAB — HIV ANTIBODY (ROUTINE TESTING W REFLEX): HIV: NONREACTIVE

## 2011-01-01 LAB — APTT
aPTT: 28 seconds (ref 24–37)
aPTT: 28 seconds (ref 24–37)
aPTT: 31 seconds (ref 24–37)
aPTT: 38 seconds — ABNORMAL HIGH (ref 24–37)

## 2011-01-01 LAB — SEDIMENTATION RATE: Sed Rate: 58 mm/hr — ABNORMAL HIGH (ref 0–16)

## 2011-01-01 LAB — BODY FLUID CULTURE: Culture: NO GROWTH

## 2011-01-01 LAB — CREATININE, URINE, RANDOM
Creatinine, Urine: 155.4 mg/dL
Creatinine, Urine: 176.3 mg/dL

## 2011-01-01 LAB — AMMONIA
Ammonia: 43 umol/L — ABNORMAL HIGH (ref 11–35)
Ammonia: 50 umol/L — ABNORMAL HIGH (ref 11–35)
Ammonia: 58 umol/L — ABNORMAL HIGH (ref 11–35)

## 2011-01-01 LAB — VANCOMYCIN, RANDOM: Vancomycin Rm: 8.4 ug/mL

## 2011-01-01 LAB — LIPID PANEL
Cholesterol: 84 mg/dL (ref 0–200)
LDL Cholesterol: 22 mg/dL (ref 0–99)

## 2011-01-01 LAB — FUNGUS CULTURE W SMEAR: Fungal Smear: NONE SEEN

## 2011-01-01 LAB — TECHNOLOGIST SMEAR REVIEW: Path Review: DECREASED

## 2011-01-01 LAB — HEPATITIS PANEL, ACUTE
HCV Ab: REACTIVE — AB
Hep A IgM: NEGATIVE
Hep B C IgM: NEGATIVE

## 2011-01-01 LAB — MRSA PCR SCREENING: MRSA by PCR: POSITIVE — AB

## 2011-01-01 LAB — C-REACTIVE PROTEIN: CRP: 21.4 mg/dL — ABNORMAL HIGH (ref <0.6)

## 2011-01-01 LAB — AFP TUMOR MARKER: AFP-Tumor Marker: <1.3 ng/mL (ref 0.0–8.0)

## 2011-01-01 LAB — ANAEROBIC CULTURE

## 2011-01-01 LAB — PROCALCITONIN
Procalcitonin: 21.18 ng/mL
Procalcitonin: 4.79 ng/mL

## 2011-01-02 NOTE — Progress Notes (Signed)
  Phone Note Outgoing Call   Call placed by: sally caldwell, rn Call placed to: [employer] Summary of Call: we rec'd a fax asking for info & md signature on patient assistance form for [employer]. Called them at [phone]. asked them to fax the app here. they have it started by another md-not in this office, but want Dr. Forbes Cellar to sign. will give to Dr. Ninetta Lights when rec'd Initial call taken by: sally caldwell, rn  Follow-up for Phone Call        app rec'd. shown to [facility]. faxed all back to [facility] Follow-up by: Maxwell Marion

## 2011-01-03 NOTE — Telephone Encounter (Signed)
Talked with pt and he will come in Friday 3/23 and bring all his meds for med rec. Pt informed Dr Denton Meek did not want to increase xanax.

## 2011-01-05 ENCOUNTER — Encounter: Payer: Self-pay | Admitting: Internal Medicine

## 2011-01-05 ENCOUNTER — Ambulatory Visit (INDEPENDENT_AMBULATORY_CARE_PROVIDER_SITE_OTHER): Payer: Medicaid Other | Admitting: Internal Medicine

## 2011-01-05 VITALS — BP 121/81 | HR 54 | Temp 97.0°F | Ht 71.0 in | Wt 185.7 lb

## 2011-01-05 DIAGNOSIS — R030 Elevated blood-pressure reading, without diagnosis of hypertension: Secondary | ICD-10-CM

## 2011-01-05 DIAGNOSIS — Z590 Homelessness: Secondary | ICD-10-CM

## 2011-01-05 DIAGNOSIS — K219 Gastro-esophageal reflux disease without esophagitis: Secondary | ICD-10-CM | POA: Insufficient documentation

## 2011-01-05 DIAGNOSIS — G8929 Other chronic pain: Secondary | ICD-10-CM

## 2011-01-05 DIAGNOSIS — F329 Major depressive disorder, single episode, unspecified: Secondary | ICD-10-CM

## 2011-01-05 DIAGNOSIS — F411 Generalized anxiety disorder: Secondary | ICD-10-CM

## 2011-01-05 DIAGNOSIS — F419 Anxiety disorder, unspecified: Secondary | ICD-10-CM

## 2011-01-05 DIAGNOSIS — M2550 Pain in unspecified joint: Secondary | ICD-10-CM

## 2011-01-05 MED ORDER — METOPROLOL TARTRATE 25 MG PO TABS: ORAL_TABLET | ORAL | Status: DC

## 2011-01-05 MED ORDER — ALPRAZOLAM 1 MG PO TABS: 1.0000 mg | ORAL_TABLET | Freq: Two times a day (BID) | ORAL | Status: DC

## 2011-01-05 MED ORDER — OXYCODONE HCL 10 MG PO TABS: ORAL_TABLET | ORAL | Status: DC

## 2011-01-05 MED ORDER — OMEPRAZOLE 40 MG PO CPDR: 40.0000 mg | DELAYED_RELEASE_CAPSULE | Freq: Every day | ORAL | Status: DC

## 2011-01-05 MED ORDER — SERTRALINE HCL 100 MG PO TABS: 100.0000 mg | ORAL_TABLET | Freq: Every day | ORAL | Status: DC

## 2011-01-05 NOTE — Patient Instructions (Signed)
1) Please follow-up at the clinic in 1 month, at which time we will reevaluate your blood pressure, depression, anxiety, reflux. 2) If you have been started on new medication(s), and you develop throat closing, tongue swelling, rash, please stop the medication and call the clinic at [phone] and go to the ER. 3) Please bring all of your medications in a bag to your next visit. 4) Please follow-up with Dorothe Pea who will help see if there are any resources available for your homelessness, and will get you plugged in with Mental health services.  5) If symptoms worsen, or new symptoms arise, please call the clinic or go to the ER. 6) If you have worsening depression or anxiety with the increased Zoloft, or if you have suicidal or homicidal feelings, please call the clinic immediately and/ or go to the ER.

## 2011-01-05 NOTE — Assessment & Plan Note (Addendum)
-   Continue current Xanax. -  Increase sertraline dosage - counseled to call clinic or go to ER if worsening depression symptoms.  -  Dorothe Pea referral for assistance with homelessness, disability, establish with mental health.

## 2011-01-05 NOTE — Assessment & Plan Note (Signed)
Pt was just started on Metoprolol 25mg  BID at [facility], although not clear why it was started (on record review BP was to max 150 mmHg systolic), but there was initial concern about cardiac chest pain that was ultimately determined to be 2/2 GERD. Today, pt is bradycardic. - Will decrease dose to 12.5 mg, reeval next visit, and consider discontinue all together if indicated.

## 2011-01-05 NOTE — Progress Notes (Signed)
Subjective:    Patient ID: Alex Henry, male    DOB: July 05, 1951, 60 y.o.   MRN: [id number]  HPI Pt is a 60yo male with PMHx of hepatitis C, hepatic cirrhosis, recent sternoclavicular septic arthritis, who presents to clinic today for the following:  1) Anxiety and Depression - currently taking Zoloft, and Xanax 1 mg BID. States still having panic attacks 4-5 times per week. No specific precipitants, last approximately 15-20 minutes at a time, symptoms include jitteriness, queasiness, worried, nervous. Feeling more down because basically homeless. Denied disability. Feels more isolated, anhedonia. Refills requested.  2) HTN - Patient does not check blood pressure regularly at home. Currently taking Metoprolol 25 mg BID - which was just recently started at [facility]. Denies headaches, dizziness, lightheadedness, chest pain, shortness of breath. Did take the metoprolol this AM. Refills requested.  3) GERD - taking Omeprazole, states not having frequent reflux symptoms but was increased from 20mg  qday to 40mg  BID recently at [facility]. Reason unknown.  4) Chronic pain - Requesting pain meds. Describes generalized pain, in shoulder joints feels like a knife going into it. Ongoing since 02/2010 after he fell onto left side in [employer] onto concrete floor.  Since that time he has had diffuse pain throughout whole body, especially joints, which has been stable and is not progressing in intensity. Described as a 10/10 sharp/stabbing, constant pain.  Aggravated by activity. Alleviated by pain meds and resting. Pain better - Not better with icing or heating pads. No numbness, tingling, weakness of arms or legs..   Of note, from 07/14-08/04/11, Mr. Moch was hospitalized for epidural abscess with diskitis and osteomyelitis at [facility]. Also was recently treated for sternoclavicular septic arthritis.  No fevers, chills, increased confusion, no weakness, no numbness, no tingling. No new falls or  trauma.  Review of Systems Per HPI.  Current Outpatient Medications Medication Sig  . ALPRAZolam (XANAX) 1 MG tablet Take 1 tablet (1 mg total) by mouth 2 (two) times daily. May not be filled until 12/11/10.  . metoprolol tartrate (LOPRESSOR) 25 MG tablet Take 25 mg by mouth 2 (two) times daily.    Marland Kitchen omeprazole (PRILOSEC OTC) 20 MG tablet 40 mg 2 (two) times daily. Take 1 to 2 tablets  By mouth daily  . Oxycodone HCl 10 MG TABS Take by mouth every 4 (four) hours as needed. For pain May not fill until 12/11/10  . sertraline (ZOLOFT) 100 MG tablet Take 1 tablet (100 mg total) by mouth daily.  Marland Kitchen docusate sodium (DOCU SOFT) 100 MG capsule Take 100 mg by mouth 2 (two) times daily as needed.    . gabapentin (NEURONTIN) 800 MG tablet Take 800 mg by mouth 3 (three) times daily.     Allergies Ceftriaxone sodium and Codeine  Past Medical History  Diagnosis Date  . Bacteremia 02/2010    GBS bacteremia - tx with Abx x 28 days  . Septic arthritis     sternoclavicular  . Hepatitis C DX: 03/2010    VL (03/2010) 5,940,000  . Depression   . Anxiety   . History of Clostridium difficile infection   . Discitis   . Epidural abscess     H/O   . Hypertension   . Shoulder pain, bilateral   . Anemia   . Hepatic cirrhosis   . Hepatic encephalopathy 03/2010  . Allergic rhinitis     chronic  . Drug-induced constipation   . Osteomyelitis 05/2010, 07/2010    Diskitis and osteomyelitis at  C6-C7   . Thrombocytopenia   . Alcohol abuse     History reviewed. No pertinent past surgical history.     Objective:   Physical Exam General:  Well-developed,well-nourished,in no acute distress; alert,  and cooperative throughout examination Head:  Normocephalic and atraumatic without obvious abnormalities.  Neck:  TTP over lower cervical spine, without obvious deformities, no warmth, erythema, lesions appreciated.  Lungs:  Normal respiratory effort, chest expands symmetrically. Lungs are clear to  auscultation, no crackles or wheezes. Heart:  Normal rate and regular rhythm. S1 and S2 normal without gallop, murmur, click, rub or other extra sounds. Abdomen:  Bowel sounds positive,abdomen soft and non-tender  Msk:  No joint swelling, no joint warmth, no redness over joints, and no joint instability, no tenderness to palpation over joints.  Pulses:  R and L dorsalis pedis pulses are full and equal bilaterally Extremities:  no edema Neurologic:  muscle strength 5/5 all 4 extremities    Assessment & Plan:  Case and plan of care discussed with Dr. Blanch Media.

## 2011-01-05 NOTE — Assessment & Plan Note (Signed)
-   Refill pain meds - Unclear if pain contract established - will likely need one by PCP if plan to continue chronic pain meds.

## 2011-01-05 NOTE — Assessment & Plan Note (Signed)
On record review from [facility], it seems pt presented with atypical chest pain, had negative lexiscan, and ultimately symptoms seemed most consistent with GERD exacerbation. Symptoms currently well controlled. - Will reduce PPI dosage, back to 40mg  daily (which is still an increase of prior 20mg  dosage)

## 2011-01-15 IMAGING — CR DG HIP COMPLETE 2+V*R*
2 series · 2 of 2 positions shown · non-contrast
Comparison: None

CLINICAL DATA: Right hip pain

RIGHT HIP - COMPLETE 2+ VIEW

[t hip ap right]
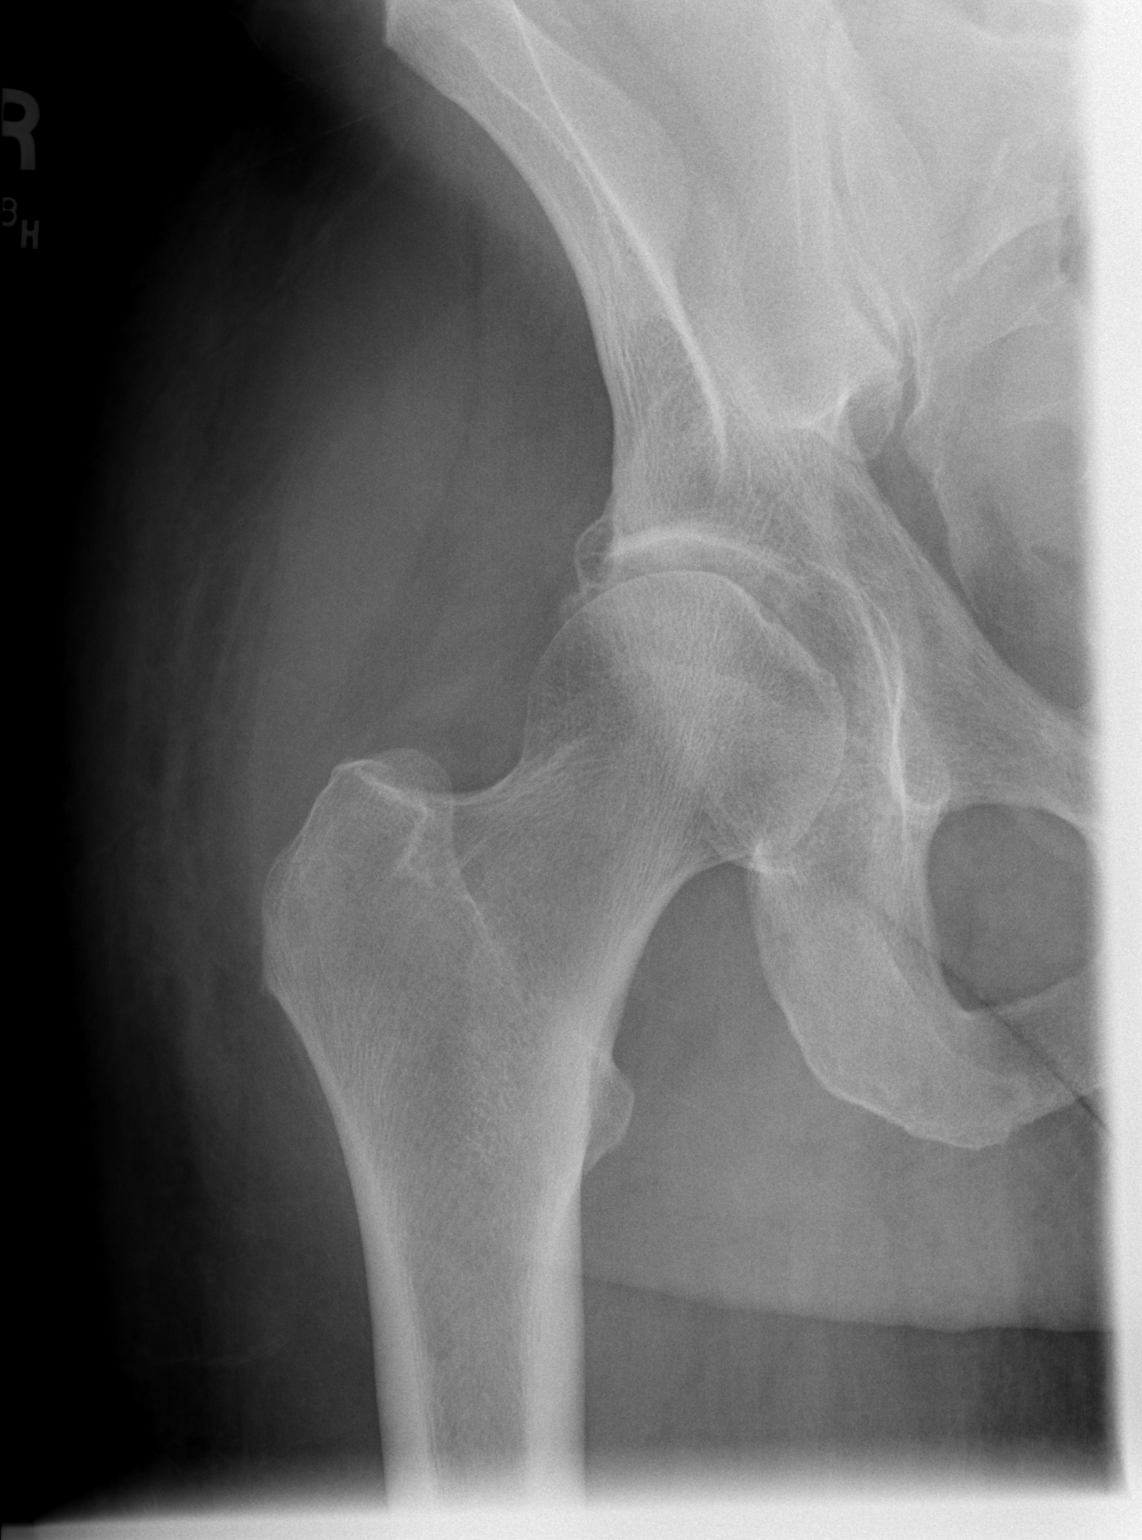

[t hip frog leg right]
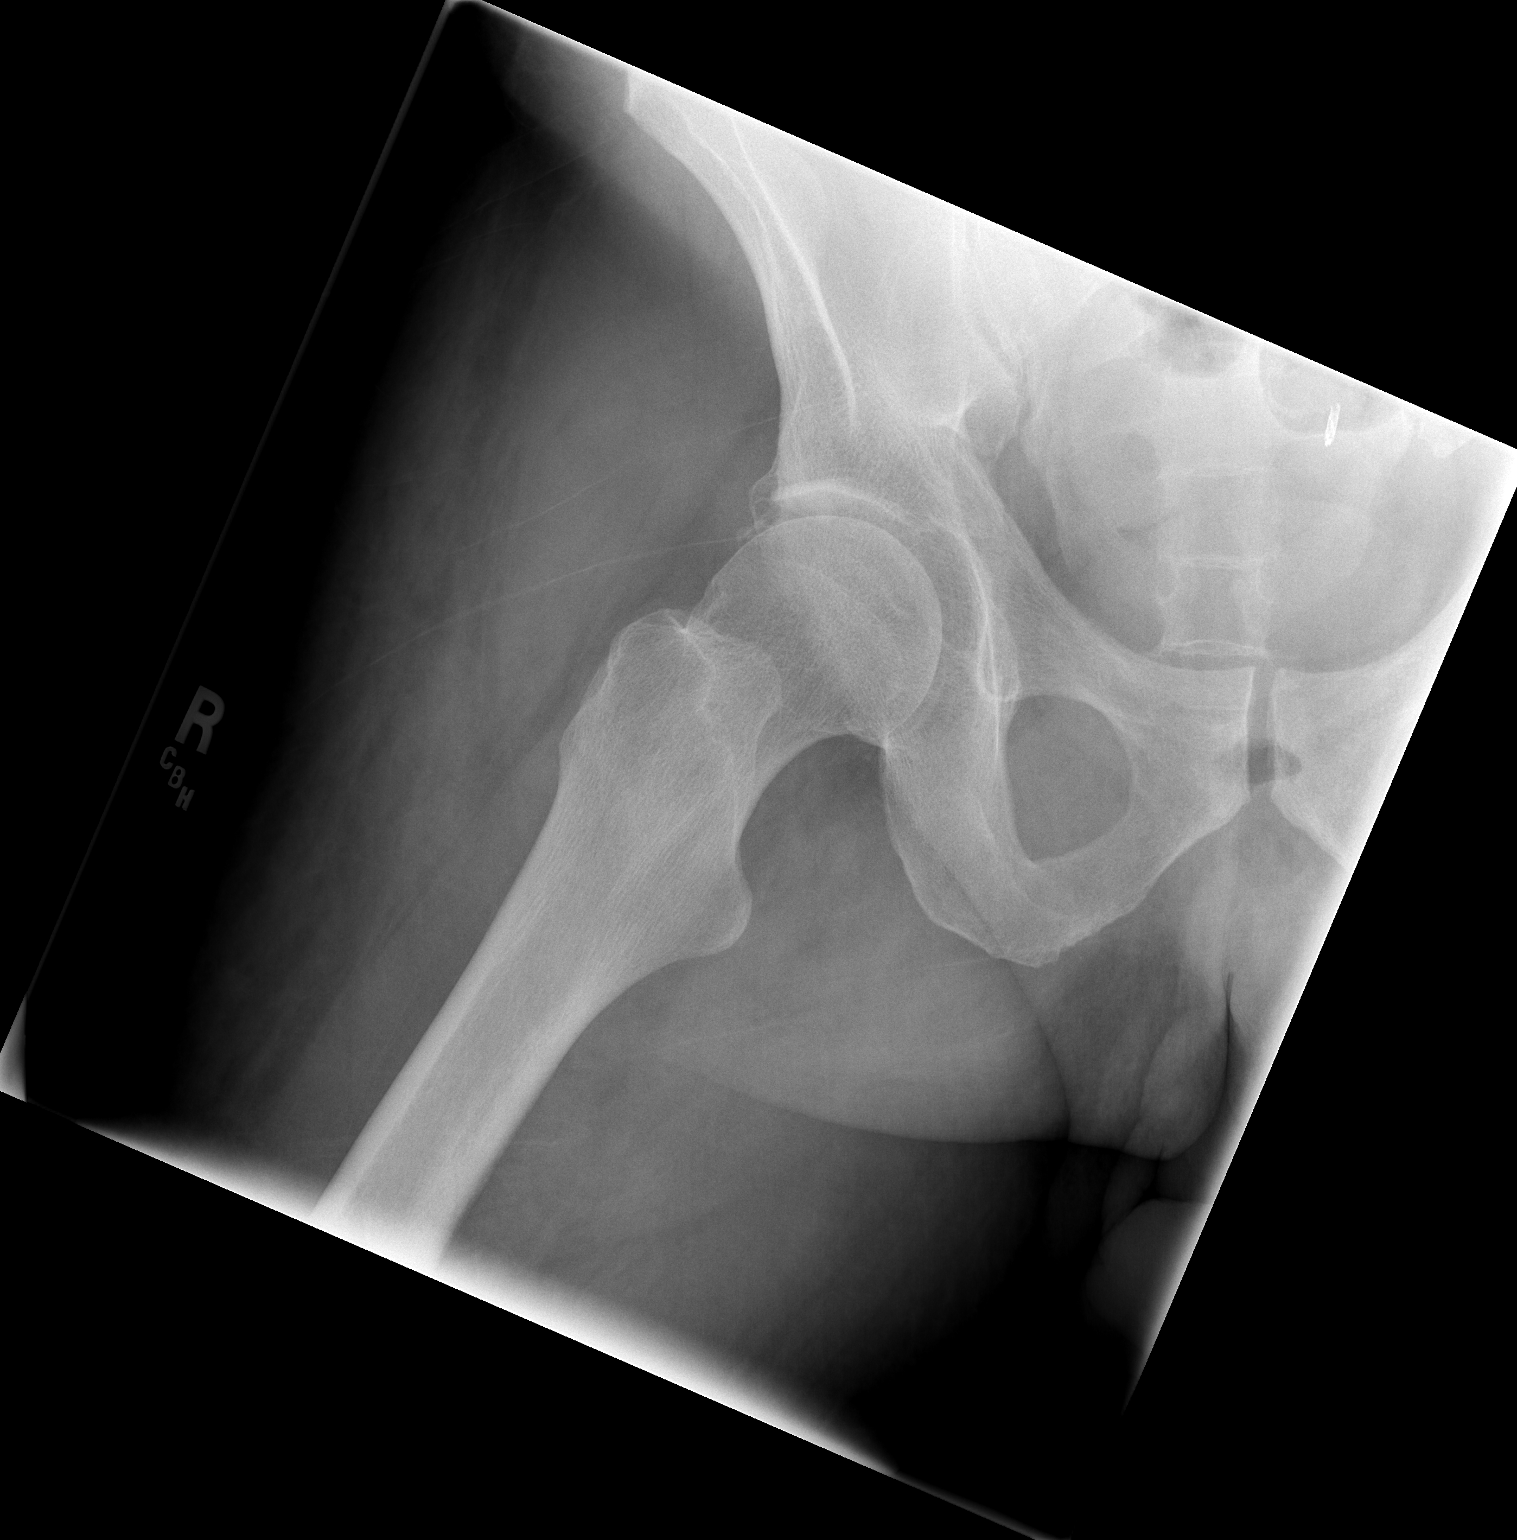

[2 of 2 positions shown; findings below may reference images not displayed]

FINDINGS: Degenerative changes present primarily consisting of
proliferate changes involving the superior aspect of the
acetabulum.  No evidence of fracture, dislocation, bony lesion,
erosion or soft tissue abnormality.
IMPRESSION: Degenerative changes of right hip.

## 2011-01-17 ENCOUNTER — Telehealth: Payer: Self-pay | Admitting: Licensed Clinical Social Worker

## 2011-01-17 IMAGING — CR DG CHEST 2V
2 series · 2 of 2 positions shown · non-contrast
Comparison: 03/17/2010

CLINICAL DATA: Fever, incoherent

CHEST - 2 VIEW

[w chest pa]
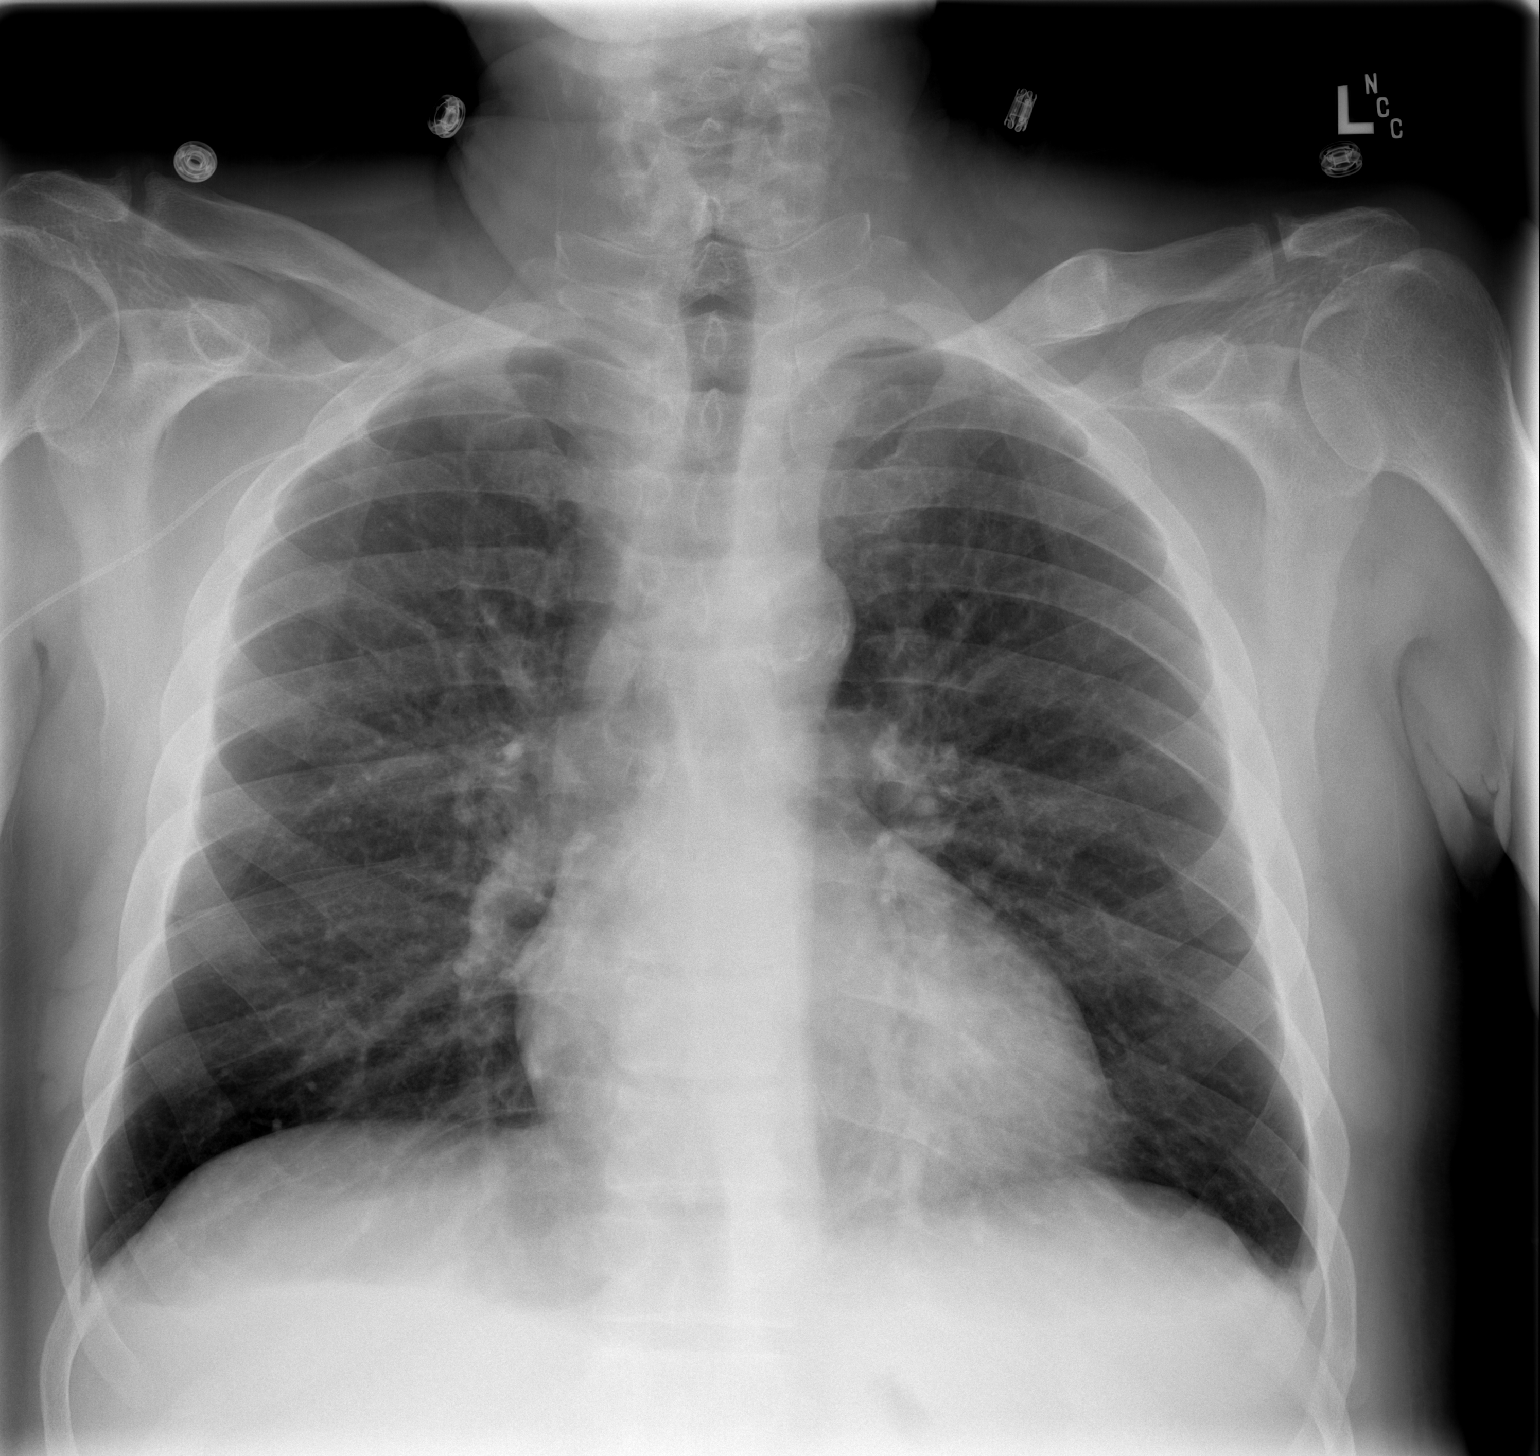

[w chest lat]
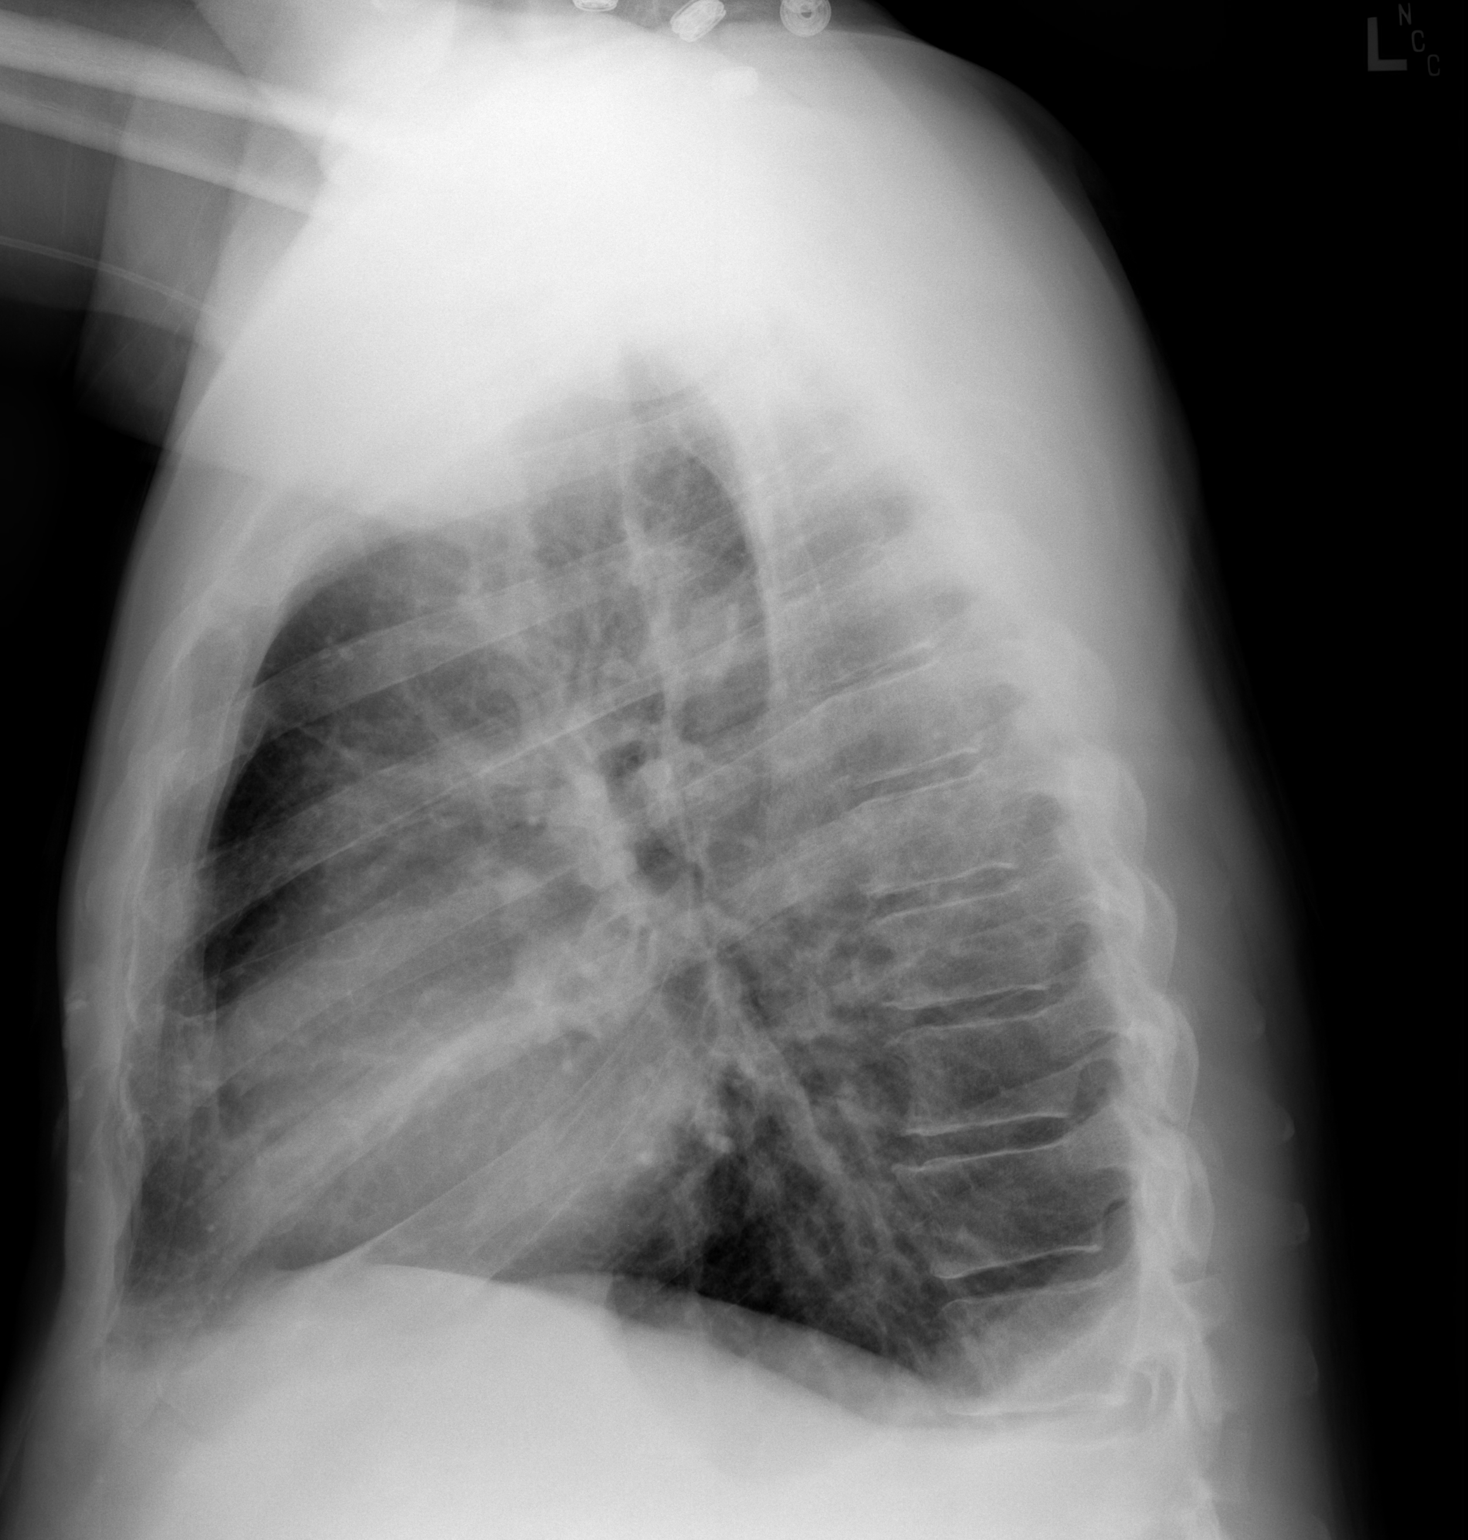

[2 of 2 positions shown; findings below may reference images not displayed]

FINDINGS: Cardiomediastinal silhouette is stable.  No acute
infiltrate or pulmonary edema. Bony thorax is stable.
Tiny posterior pleural effusion bilaterally again noted. Old
fracture of the left clavicle again noted.
IMPRESSION: No active disease.  No significant change.

## 2011-01-29 ENCOUNTER — Encounter: Payer: Self-pay | Admitting: Licensed Clinical Social Worker

## 2011-01-29 NOTE — Telephone Encounter (Signed)
01/29/11   Left another message for patient to call SW.    Will also send letter.

## 2011-02-05 ENCOUNTER — Telehealth: Payer: Self-pay | Admitting: Licensed Clinical Social Worker

## 2011-02-05 NOTE — Telephone Encounter (Signed)
Telephone call with Mr. Alex Henry. He told me that he is still homeless living with relatives. He has two sisters in [place] and his mother is in [place].  He has Medicaid.  His F/S are $200 per month.  He reports that [name] is assisting him with his SSI.  I called [name] who told me that they did a new application in February for his SSI.  He has a myriad of health issues including HEP C, cirrhosis, depression, anxiety, hx of MRSA., chronic pain.  Gunnar Fusi said she will get in contact with the examiner but could I mail the last few office visit notes.   I agreed to do so and will c/b Mr. Sherwin at [phone].

## 2011-02-07 ENCOUNTER — Other Ambulatory Visit: Payer: Self-pay | Admitting: *Deleted

## 2011-02-07 DIAGNOSIS — G8929 Other chronic pain: Secondary | ICD-10-CM

## 2011-02-07 DIAGNOSIS — F329 Major depressive disorder, single episode, unspecified: Secondary | ICD-10-CM

## 2011-02-07 DIAGNOSIS — F419 Anxiety disorder, unspecified: Secondary | ICD-10-CM

## 2011-02-07 MED ORDER — ALPRAZOLAM 1 MG PO TABS: 1.0000 mg | ORAL_TABLET | Freq: Two times a day (BID) | ORAL | Status: DC

## 2011-02-07 MED ORDER — OXYCODONE HCL 10 MG PO TABS: ORAL_TABLET | ORAL | Status: DC

## 2011-02-07 MED ORDER — SERTRALINE HCL 100 MG PO TABS: 100.0000 mg | ORAL_TABLET | Freq: Every day | ORAL | Status: DC

## 2011-02-07 NOTE — Telephone Encounter (Signed)
Last refill on 3/23

## 2011-02-09 MED ORDER — SERTRALINE HCL 100 MG PO TABS: 100.0000 mg | ORAL_TABLET | Freq: Every day | ORAL | Status: DC

## 2011-02-09 MED ORDER — OXYCODONE HCL 10 MG PO TABS: ORAL_TABLET | ORAL | Status: DC

## 2011-02-09 MED ORDER — ALPRAZOLAM 1 MG PO TABS: 1.0000 mg | ORAL_TABLET | Freq: Two times a day (BID) | ORAL | Status: DC

## 2011-02-09 NOTE — Telephone Encounter (Signed)
Rx written but not found in clinic,  I had attending rewrite all 3 Rx for pt. The printer malfunctioned and Rx's needed to be printed for a third time.

## 2011-03-06 ENCOUNTER — Other Ambulatory Visit: Payer: Self-pay | Admitting: *Deleted

## 2011-03-06 ENCOUNTER — Encounter: Payer: Self-pay | Admitting: Internal Medicine

## 2011-03-06 DIAGNOSIS — G8929 Other chronic pain: Secondary | ICD-10-CM

## 2011-03-06 DIAGNOSIS — F329 Major depressive disorder, single episode, unspecified: Secondary | ICD-10-CM

## 2011-03-06 DIAGNOSIS — F419 Anxiety disorder, unspecified: Secondary | ICD-10-CM

## 2011-03-06 MED ORDER — SERTRALINE HCL 100 MG PO TABS: 100.0000 mg | ORAL_TABLET | Freq: Every day | ORAL | Status: DC

## 2011-03-06 MED ORDER — ALPRAZOLAM 1 MG PO TABS: 1.0000 mg | ORAL_TABLET | Freq: Two times a day (BID) | ORAL | Status: DC

## 2011-03-06 MED ORDER — OXYCODONE HCL 10 MG PO TABS: ORAL_TABLET | ORAL | Status: DC

## 2011-03-06 MED ORDER — METOPROLOL TARTRATE 25 MG PO TABS: ORAL_TABLET | ORAL | Status: DC

## 2011-03-07 ENCOUNTER — Encounter: Payer: Medicaid Other | Admitting: Internal Medicine

## 2011-03-07 NOTE — Telephone Encounter (Signed)
Pt received Rx 

## 2011-03-08 ENCOUNTER — Encounter: Payer: Medicaid Other | Admitting: Internal Medicine

## 2011-03-13 ENCOUNTER — Encounter: Payer: Self-pay | Admitting: *Deleted

## 2011-03-13 ENCOUNTER — Other Ambulatory Visit: Payer: Self-pay | Admitting: Internal Medicine

## 2011-03-13 ENCOUNTER — Other Ambulatory Visit: Payer: Self-pay | Admitting: *Deleted

## 2011-03-13 DIAGNOSIS — F419 Anxiety disorder, unspecified: Secondary | ICD-10-CM

## 2011-03-13 DIAGNOSIS — F329 Major depressive disorder, single episode, unspecified: Secondary | ICD-10-CM

## 2011-03-13 NOTE — Progress Notes (Signed)
Pt arrived today and wants to be seen so he can get him meds increased. Xanax he would like to take it 4 times a day and on oxycodone 10 mg he would like to increase to 5 times a day.  Pt # [id number]  -  May leave message.

## 2011-03-13 NOTE — Telephone Encounter (Signed)
Patient was explained in the past that he would need to be evaluated by a psychiatrist in light of worsened anxiety symptoms. I am reluctant to increase the dose of Alprazolam until then. I will place a referral for the patient to be seen at the [facility]. Thank you.

## 2011-03-13 NOTE — Telephone Encounter (Signed)
Pt informed and will wait for [facility] referral.

## 2011-03-13 NOTE — Telephone Encounter (Signed)
Last filled 5/22 # 60.  Pt is requesting 4 xanax a day.  He gets very nervous and shakes when he only gets 2 a day. He would also like 1 more oxycodone a day, total 5 a day. Please advise

## 2011-03-14 ENCOUNTER — Telehealth: Payer: Self-pay | Admitting: Licensed Clinical Social Worker

## 2011-03-14 NOTE — Telephone Encounter (Signed)
Gave pt access line information and explained how to go about making an appmt to be seen thru [place].   Patient will also be seen on June 5th by Dr. Sherryll Burger at 8: 45 AM for follow-up and SW will w/ patient at that time.

## 2011-03-20 ENCOUNTER — Encounter: Payer: Medicaid Other | Admitting: Internal Medicine

## 2011-03-20 NOTE — Telephone Encounter (Signed)
The patient cancelled his appmt today with MD/I had called him and said I would assist him with MH appmt/  So I was unable to assist him today with that and any other needs.  I had previously given this patient information over the phone and he is free to call there to schedule an appointment as we cannot force him to go to mental health.   I will send him info in the mail pertaining to mental health.

## 2011-03-22 ENCOUNTER — Telehealth: Payer: Self-pay | Admitting: *Deleted

## 2011-03-22 NOTE — Telephone Encounter (Signed)
RTC to pt.  Pt said that the Clinics had called.  Was unsure as to why.  Py was informed that he had cancelled an appointment on 03/20/2011.  Pt said that he thought the RTC was about his medicine.  Pt was informed that his meds were to be discussed on the 03/20/2011 appointment.  Pt was asked if he had rescheduled he said no.  Pt was then asked if he would like to go ahead and reschedule the appointment.  Pt said yes.  Pt was also asked if he had made an appointment with Mental Health pt said that he had an appointment on 03/20/2011 and also missed that appointment.  Said that his sister was unable to take him for the appointment.  Pt said when asked that he has not rescheduled that appointment.  Pt was given to the scheduler to schedule an appointment.

## 2011-04-03 ENCOUNTER — Telehealth: Payer: Self-pay | Admitting: Licensed Clinical Social Worker

## 2011-04-03 NOTE — Telephone Encounter (Signed)
Alex Henry has called and SW again gave him the [facility] access line phone number so he could call there and make an appmt for his depression/anxiety.  He missed an appmt with Soc. Work so I could not help him secure that appointment.  He sounded very slow and groggy on the phone and it is questionable whether he can make that appmt on his own.

## 2011-04-05 ENCOUNTER — Other Ambulatory Visit: Payer: Self-pay | Admitting: *Deleted

## 2011-04-05 DIAGNOSIS — G8929 Other chronic pain: Secondary | ICD-10-CM

## 2011-04-05 DIAGNOSIS — F419 Anxiety disorder, unspecified: Secondary | ICD-10-CM

## 2011-04-05 MED ORDER — OXYCODONE HCL 10 MG PO TABS: ORAL_TABLET | ORAL | Status: DC

## 2011-04-05 MED ORDER — ALPRAZOLAM 1 MG PO TABS: 1.0000 mg | ORAL_TABLET | Freq: Two times a day (BID) | ORAL | Status: DC

## 2011-04-05 NOTE — Telephone Encounter (Signed)
Rxs ready to be pick up; pt was called.

## 2011-04-05 NOTE — Telephone Encounter (Signed)
Collect UDS when he comes in for scripts. Remind patient he must keep July app't with Denton Meek.

## 2011-04-06 ENCOUNTER — Other Ambulatory Visit: Payer: Medicaid Other

## 2011-04-06 DIAGNOSIS — G8929 Other chronic pain: Secondary | ICD-10-CM

## 2011-04-06 NOTE — Telephone Encounter (Signed)
UDS obtained; rx given to pt including Zoloft rx written by Dr. Denton Meek 03/06/11. Also reminded pt of appt w/Dr. Denton Meek.

## 2011-04-06 NOTE — Progress Notes (Signed)
Addended by: Remus Blake on: 04/06/2011 11:53 AM   Modules accepted: Orders

## 2011-04-07 LAB — DRUGS OF ABUSE SCREEN W/O ALC, ROUTINE URINE
Amphetamine Screen, Ur: NEGATIVE
Benzodiazepines.: POSITIVE — AB
Cocaine Metabolites: NEGATIVE
Creatinine,U: 158.7 mg/dL

## 2011-04-10 LAB — BENZODIAZEPINE, QUANTITATIVE, URINE
Flurazepam Metabolite: NEGATIVE NG/ML
Lorazepam: NEGATIVE NG/ML
Temazapam: NEGATIVE NG/ML

## 2011-04-10 LAB — OPIATE, QUANTITATIVE, URINE
Codeine Urine: NEGATIVE NG/ML
Hydromorphone - Total: NEGATIVE NG/ML
Morphine Urine: NEGATIVE NG/ML
Oxycodone - Total: NEGATIVE NG/ML

## 2011-04-24 ENCOUNTER — Ambulatory Visit (INDEPENDENT_AMBULATORY_CARE_PROVIDER_SITE_OTHER): Payer: Medicaid Other | Admitting: Internal Medicine

## 2011-04-24 ENCOUNTER — Ambulatory Visit (HOSPITAL_COMMUNITY)
Admission: RE | Admit: 2011-04-24 | Discharge: 2011-04-24 | Disposition: A | Discharge status: Home / Self Care | Payer: Medicaid Other | Source: Ambulatory Visit | Attending: Internal Medicine | Admitting: Internal Medicine

## 2011-04-24 ENCOUNTER — Encounter: Payer: Self-pay | Admitting: Internal Medicine

## 2011-04-24 ENCOUNTER — Other Ambulatory Visit: Payer: Self-pay | Admitting: Internal Medicine

## 2011-04-24 VITALS — BP 160/85 | HR 93 | Temp 97.8°F | Ht 71.0 in | Wt 184.7 lb

## 2011-04-24 DIAGNOSIS — M25519 Pain in unspecified shoulder: Secondary | ICD-10-CM

## 2011-04-24 DIAGNOSIS — K7290 Hepatic failure, unspecified without coma: Secondary | ICD-10-CM

## 2011-04-24 DIAGNOSIS — R079 Chest pain, unspecified: Secondary | ICD-10-CM | POA: Insufficient documentation

## 2011-04-24 DIAGNOSIS — F329 Major depressive disorder, single episode, unspecified: Secondary | ICD-10-CM

## 2011-04-24 DIAGNOSIS — G8929 Other chronic pain: Secondary | ICD-10-CM

## 2011-04-24 DIAGNOSIS — F419 Anxiety disorder, unspecified: Secondary | ICD-10-CM

## 2011-04-24 DIAGNOSIS — R4182 Altered mental status, unspecified: Secondary | ICD-10-CM

## 2011-04-24 DIAGNOSIS — F411 Generalized anxiety disorder: Secondary | ICD-10-CM

## 2011-04-24 MED ORDER — ALPRAZOLAM 1 MG PO TABS: 1.0000 mg | ORAL_TABLET | Freq: Two times a day (BID) | ORAL | Status: DC

## 2011-04-24 MED ORDER — SERTRALINE HCL 100 MG PO TABS: 100.0000 mg | ORAL_TABLET | Freq: Every day | ORAL | Status: DC

## 2011-04-25 LAB — DRUGS OF ABUSE SCREEN W/O ALC, ROUTINE URINE
Amphetamine Screen, Ur: NEGATIVE
Benzodiazepines.: POSITIVE — AB
Methadone: NEGATIVE
Phencyclidine (PCP): NEGATIVE
Propoxyphene: NEGATIVE

## 2011-04-25 LAB — COMPLETE METABOLIC PANEL WITH GFR
ALT: 78 U/L — ABNORMAL HIGH (ref 0–53)
AST: 115 U/L — ABNORMAL HIGH (ref 0–37)
CO2: 22 mEq/L (ref 19–32)
GFR, Est African American: >60 mL/min (ref >60)
Sodium: 141 mEq/L (ref 135–145)
Total Bilirubin: 1.4 mg/dL — ABNORMAL HIGH (ref 0.3–1.2)
Total Protein: 7.6 g/dL (ref 6.0–8.3)

## 2011-04-25 LAB — CBC
HCT: 37 % — ABNORMAL LOW (ref 39.0–52.0)
Hemoglobin: 12.2 g/dL — ABNORMAL LOW (ref 13.0–17.0)
MCH: 29.3 pg (ref 26.0–34.0)
MCHC: 33 g/dL (ref 30.0–36.0)

## 2011-04-26 ENCOUNTER — Telehealth: Payer: Self-pay | Admitting: Licensed Clinical Social Worker

## 2011-04-26 NOTE — Telephone Encounter (Signed)
Called patient once again to attempt to coordinate mental health services on his behalf per another referral from physician.   Alex Henry is currently living in [facility] with a friend in an old house in [place]. He is disoriented on the phone, a poor historian and unable to relate his insurance information.  He told me he has a Medicaid card in two counties ([place] yet the system shows he was in the midst of financial eligibility with [employer] as well.   I referred him several times to [facility] access line because that is the address shown on his chart and he has been homeless.  Physician now advises that he lives in [place] and so needs services there.   Pt. has given me his sister's phone number [phone] to help coordinate care.  She apparently provided transit at his last appointment.   Medicaid number shown in system is [id number] K.  I will have to confirm his Medicaid and correct county and then explore services in [facility] for mental health and also pain mgmt.    I don't believe that the patient is capable of managing his care and following through at this time based on multiple conversations with him and no indication that he has followed through on referrals successfully or unsuccessfully.    Continued SW investigation and coordination of care.

## 2011-04-27 LAB — BENZODIAZEPINE, QUANTITATIVE, URINE
Lorazepam: NEGATIVE NG/ML
Temazapam: NEGATIVE NG/ML

## 2011-04-27 LAB — OPIATE, QUANTITATIVE, URINE
Codeine Urine: NEGATIVE NG/ML
Morphine Urine: NEGATIVE NG/ML

## 2011-05-01 NOTE — Progress Notes (Signed)
Labs reviewed. High index of suspicion for a MS contin diversion. Will no longer Rx any opioids. Patient was notified.

## 2011-05-02 ENCOUNTER — Telehealth: Payer: Self-pay | Admitting: Licensed Clinical Social Worker

## 2011-05-02 NOTE — Telephone Encounter (Signed)
Extensive conversation with Alex Henry sister who lives in [place].  She told me that she was helping her brother with transportation however her car is broken down and unable to assist him. She reported that her brother does not follow up on anything and is likely taking too much medication.  She is not aware that he is on any street drugs.   Alex Henry' Medicaid is out of [facility] and so he must go to [facility] MH/Cannot be seen in [place].   Other wise he can call [facility] and try to change his Medicaid to [facility].   [facility] MH is a walk-in clinic at [facility] in [place]  (Phone is [phone]).  Also [place] location at [address]  Phone is [phone].  Patient's [facility] address is [address] in [place]  [address].

## 2011-05-03 ENCOUNTER — Telehealth: Payer: Self-pay | Admitting: Licensed Clinical Social Worker

## 2011-05-03 NOTE — Telephone Encounter (Signed)
Called DSS relating to Medicaid issue--Patient has [facility] but resides in [place]. Georgetta from DSS said that patient would need to sign letter authorizing change of address and county on his Medicaid card.  So the plan is to get him to sign letter authorizing change of county when he comes in.  Otherwise cannot get him mental health care and transit to [facility] appointments in [facility].  If the change is faxed on the 15th of the month,  it will likely be done by the following month.   Also followed on Disability and Gunnar Fusi from [facility] said patient is in the reconsideration phase of his claim.

## 2011-05-04 ENCOUNTER — Encounter: Payer: Self-pay | Admitting: Licensed Clinical Social Worker

## 2011-05-05 ENCOUNTER — Encounter: Payer: Self-pay | Admitting: Internal Medicine

## 2011-05-05 NOTE — Assessment & Plan Note (Signed)
Severe anxiety disorder vs.? Medication diversion. UDS +for benzos. However, the patient's HPI is very inconsistent. Dr. Rogelia Boga interviewed the patient and agreed with my assessment. Will no longer Rx Xanax due to an increased risk of seizures/falls. Patient is referred to a behavioral health clinic for further management. It was thoroughly explained to the patient and he verbalized understanding.

## 2011-05-05 NOTE — Assessment & Plan Note (Signed)
Patient complains of diffuse arthralgias "all over." UDS neg for OxyContin that raises index of suspicion for MS contin diversion. Discussed with Dr. Rogelia Boga. Patient was informed that he will not longer be prescribed opioids in out clinic. Patient was given a referral to a pain management clinic. Patient verbalized understanding of my rationale.

## 2011-05-05 NOTE — Progress Notes (Signed)
  Subjective:    Patient ID: Alex Henry, male    DOB: 24-Oct-1950, 60 y.o.   MRN: [id number]  HPI  1. Patient is here "to get his Oxycontin and Xanax." Patient states that he "ran ou>" although his Rx was given 2 weeks ago.Patient gives a very vague and inconsistent information on whether he is currently taking Xanax and/or MS contin. Patient states that he would like to increase his Xanax dose  Because still "feels anxious." Denies any new concerns.  Review of Systems  Constitutional: Positive for activity change, appetite change and fatigue. Negative for fever, chills, diaphoresis and unexpected weight change.  HENT: Negative for ear pain, congestion, rhinorrhea, neck pain and neck stiffness.   Eyes: Negative for photophobia.  Respiratory: Negative.   Cardiovascular: Negative.   Gastrointestinal: Negative.   Genitourinary: Negative.   Musculoskeletal: Positive for myalgias, back pain, arthralgias and gait problem. Negative for joint swelling.  Neurological: Positive for weakness. Negative for dizziness, tremors, seizures, syncope, speech difficulty, light-headedness, numbness and headaches.  Hematological: Negative.   Psychiatric/Behavioral: Positive for decreased concentration. Negative for hallucinations, confusion and agitation.       Objective:   Physical Exam     General: Vital signs reviewed and noted. Well-developed,poiorly nourished, disshevelled,  in no acute distress; alert, appropriate and cooperative throughout examination.  Head:  atraumatic.  Neck: No deformities, masses, or tenderness noted.  Lungs:  Normal respiratory effort. Clear to auscultation BL without crackles or wheezes.  Heart: RRR. S1 and S2 normal without gallop, murmur, or rubs.  Abdomen:  BS normoactive. Soft, Nondistended, non-tender.  No masses or organomegaly.  Extremities: No pretibial edema. Ambulates with a cane.       Assessment & Plan:

## 2011-05-07 ENCOUNTER — Encounter: Payer: Self-pay | Admitting: Licensed Clinical Social Worker

## 2011-05-07 NOTE — Progress Notes (Signed)
Soc. Work.   I have been referred this patient multiple times by MD for connection to Mental Health.  He has not followed thru on [facility] referral.   Dr. Denton Meek alerted me that he is living in [facility] now however his Medicaid is out of [facility] (as confirmed by [employer]) and so I cannot get him services nor transportation unless his Medicaid is in [place].   I've called his sister and she is unable to assist him with transit and transfer of his Medicaid to current address and county.  I have sent him letter asking that he sign off on authorization to change his Medicaid to correct address and county.   This gentleman is unfortunately, in my opinion, not able to manage his own affairs.  He is often disoriented with slurred speech (I can hardly understand what he is saying on the phone).  He cannot answer basic questions about his history nor tell me what county his Medicaid is issued out of.  Again, I have sent him a letter to change his Medicaid to his current address and county and asked that he sign and date the letter so I can fax to DSS.  It is unknown whether he will or can follow through.

## 2011-05-11 ENCOUNTER — Other Ambulatory Visit: Payer: Self-pay | Admitting: *Deleted

## 2011-05-11 DIAGNOSIS — G8929 Other chronic pain: Secondary | ICD-10-CM

## 2011-05-11 NOTE — Telephone Encounter (Signed)
Pt.'s friend here to pick up rx;even though I had told pt this morning I will call him when it will be ready.  I called pt; and informed him that no rx for opioids will be wriiten per Dr. Wendie Chess note and he had been notified. Pt stated he has not talked to anyone since his visit 7/10.  His telephone # is [phone].

## 2011-05-14 ENCOUNTER — Other Ambulatory Visit: Payer: Self-pay | Admitting: *Deleted

## 2011-05-14 DIAGNOSIS — R4182 Altered mental status, unspecified: Secondary | ICD-10-CM

## 2011-05-14 DIAGNOSIS — F329 Major depressive disorder, single episode, unspecified: Secondary | ICD-10-CM

## 2011-05-14 DIAGNOSIS — K7290 Hepatic failure, unspecified without coma: Secondary | ICD-10-CM

## 2011-05-14 DIAGNOSIS — F419 Anxiety disorder, unspecified: Secondary | ICD-10-CM

## 2011-05-14 DIAGNOSIS — F411 Generalized anxiety disorder: Secondary | ICD-10-CM

## 2011-05-14 DIAGNOSIS — G8929 Other chronic pain: Secondary | ICD-10-CM

## 2011-05-14 DIAGNOSIS — M25519 Pain in unspecified shoulder: Secondary | ICD-10-CM

## 2011-05-17 MED ORDER — SERTRALINE HCL 100 MG PO TABS: 100.0000 mg | ORAL_TABLET | Freq: Every day | ORAL | Status: DC

## 2011-05-17 MED ORDER — GABAPENTIN 800 MG PO TABS: 800.0000 mg | ORAL_TABLET | Freq: Three times a day (TID) | ORAL | Status: DC

## 2011-05-18 NOTE — Telephone Encounter (Signed)
Refill for Zoloft 100 mg # 30 with 11 refills called to the [facility].

## 2011-05-21 ENCOUNTER — Encounter: Payer: Self-pay | Admitting: Internal Medicine

## 2011-05-21 ENCOUNTER — Ambulatory Visit (INDEPENDENT_AMBULATORY_CARE_PROVIDER_SITE_OTHER): Payer: Medicaid Other | Admitting: Internal Medicine

## 2011-05-21 ENCOUNTER — Encounter: Payer: Medicaid Other | Admitting: Internal Medicine

## 2011-05-21 VITALS — BP 124/75 | HR 62 | Temp 97.2°F | Wt 193.3 lb

## 2011-05-21 DIAGNOSIS — K219 Gastro-esophageal reflux disease without esophagitis: Secondary | ICD-10-CM

## 2011-05-21 DIAGNOSIS — R030 Elevated blood-pressure reading, without diagnosis of hypertension: Secondary | ICD-10-CM

## 2011-05-21 DIAGNOSIS — F329 Major depressive disorder, single episode, unspecified: Secondary | ICD-10-CM

## 2011-05-21 DIAGNOSIS — G8929 Other chronic pain: Secondary | ICD-10-CM

## 2011-05-21 DIAGNOSIS — F411 Generalized anxiety disorder: Secondary | ICD-10-CM

## 2011-05-21 MED ORDER — OMEPRAZOLE 40 MG PO CPDR: 40.0000 mg | DELAYED_RELEASE_CAPSULE | Freq: Every day | ORAL | Status: DC

## 2011-05-21 NOTE — Assessment & Plan Note (Signed)
BP Readings from Last 3 Encounters:  05/21/11 124/75  04/24/11 160/85  01/05/11 121/81    Basic Metabolic Panel:    Component Value Date/Time   NA 141 04/24/2011 1656   K 3.7 04/24/2011 1656   CL 106 04/24/2011 1656   CO2 22 04/24/2011 1656   BUN 12 04/24/2011 1656   CREATININE 0.93 04/24/2011 1656   CREATININE 0.67 11/13/2010 2228   GLUCOSE 82 04/24/2011 1656   CALCIUM 9.2 04/24/2011 1656    Assessment: Hypertension control:   controlled  Progress toward goals:   at goal Barriers to meeting goals:  no barriers identified  Plan: Hypertension treatment:  continue current medications

## 2011-05-21 NOTE — Assessment & Plan Note (Signed)
The patient's symptoms are currently better controlled on his Zoloft. He again requests refills of Xanax today. However, he is aware that he will no longer be prescribed this medication from our clinic, and is agreeable to continue his efforts short following up with mental health. - Do not prescribe Xanax from our clinic. - Continue the Zoloft. - Recommended followup with Dorothe Pea to followup on mental health options in [facility].

## 2011-05-21 NOTE — Assessment & Plan Note (Signed)
During the last clinic visit with his primary care physician, there is concern that the patient was misusing his previously prescribed narcotics. Therefore, the patient will no longer receive narcotics from our clinic. He was recommended pain management followup, which he did not comply with secondary to financial constraints. - He is recommended to followup with pain management, and to speak with them regarding possibly having less frequent visits in the setting of his financial constraints. - Do not prescribe narcotics from our clinic.

## 2011-05-21 NOTE — Assessment & Plan Note (Signed)
The patient's symptoms are currently better controlled on his Zoloft.  - Continue the Zoloft. - Recommended followup with Dorothe Pea to followup on mental health options in [facility]. - Patient was counseled regarding sleep hygiene, and handout was provided with recommendations.

## 2011-05-21 NOTE — Progress Notes (Signed)
Subjective:    Patient ID: Alex Henry, male    DOB: Dec 26, 1950, 60 y.o.   MRN: [id number]  HPI Pt is a 60 y.o. male who has a PMHX of Hep C, Depression, Anxiety,   1) Anxiety / Depression - during last visit, there was concern that patient is overusing his BZDs and that long-term, the best management for this patient would involve mental health services. As he is a resident of [facility], we are limited in terms of recommendations for him, and he indicates that he has not followed-up with any mental health services in [facility]. He has not followed up with [facility] since that time. He was also recently started on Zoloft for his depression and states that his depression symptoms are well controlled on current therapy. denies symptoms of anhedonia, feelings of isolation, disengagement from previously enjoyable activities, suicidal ideation, homicidal ideation. Confirms symptoms of difficulty falling and staying asleep (states he watches TV until bedtime).  2) Chronic pain - he should have his chronic pain from multiple sources including arthritis, history of cervical discitis. However during last followup visit with his primary care physician, there was concern that there was narcotic misuse, and it was determined that the patient should no longer be prescribing narcotics from our clinic. He was recommended pain management followup. He has not followed up with pain management since prior recommendation, as it was in the $100 per visit, which he cannot afford. He continues to have his typical chronic pain. No new paresthesias, weakness is, fevers, chills.   3) HTN - Patient does not check blood pressure regularly at home. Currently taking metoprolol. Denies headaches, dizziness, lightheadedness, chest pain, shortness of breath.  does not request refills today.  4) GERD - States it is well controlled at this time. He has not been experiencing fullness after meals, belching and  eructation, abdominal bloating, heartburn, bilious reflux, nocturnal burning. Does not drink alcohol.   Review of Systems Per HPI.  Current Outpatient Medications Medication Sig  . ALPRAZolam (XANAX) 1 MG tablet Take 1 tablet (1 mg total) by mouth 2 (two) times daily. May not be filled until 12/11/10.  . metoprolol tartrate (LOPRESSOR) 25 MG tablet 1/2 tab by mouth twice daily.  Marland Kitchen omeprazole (PRILOSEC) 40 MG capsule Take 1 capsule (40 mg total) by mouth daily.  . Oxycodone HCl 10 MG TABS Take by mouth every 4 (four) hours as needed. For pain May not fill until 12/11/10  . sertraline (ZOLOFT) 100 MG tablet Take 1 tablet (100 mg total) by mouth daily.    Allergies Ceftriaxone sodium   Past Medical History  Diagnosis Date  . Bacteremia 02/2010    GBS bacteremia - tx with Abx x 28 days  . Septic arthritis     sternoclavicular  . Hepatitis C DX: 03/2010    VL (03/2010) 5,940,000  . Depression   . Anxiety   . History of Clostridium difficile infection   . Discitis   . Epidural abscess     H/O   . Elevated blood pressure   . Shoulder pain, bilateral   . Anemia   . Hepatic cirrhosis   . Hepatic encephalopathy 03/2010  . Allergic rhinitis     chronic  . Drug-induced constipation   . Osteomyelitis 05/2010, 07/2010    Diskitis and osteomyelitis at C6-C7   . Thrombocytopenia   . Alcohol abuse   . Atypical chest pain     Seen at [facility] (11/2010) - thought to  be 2/2 GERD. Lexiscan Stress test (12/05/10) - at [facility]. No ischemia seen, normal EF of 65%       Objective:   Physical Exam General: Vital signs reviewed and noted. Well-developed, well-nourished, in no acute distress; alert, appropriate and cooperative throughout examination.  Head: Normocephalic, atraumatic.  Neck: No deformities, masses, or tenderness noted.  Lungs:  Normal respiratory effort. Clear to auscultation BL without crackles or wheezes.  Heart: RRR. S1 and S2 normal without gallop,  murmur, or rubs.  Abdomen:  BS normoactive. Soft, Nondistended, non-tender.  No masses or organomegaly.  Extremities: No pretibial edema.       Assessment & Plan:  Case and plan of care discussed with Dr. Blanch Media.

## 2011-05-21 NOTE — Assessment & Plan Note (Signed)
Well-controlled.  Continue current regimen. 

## 2011-05-21 NOTE — Patient Instructions (Signed)
Please follow-up at the clinic in 2-3 months with your PCP, at which time we will reevaluate depression, anxiety, and follow-up to see if you've your specialists (Mental health and Pain management).  Please call [facility] to get set up mental health services.  If you have been started on new medication(s), and you develop throat closing, tongue swelling, rash, please stop the medication and call the clinic at [phone] and go to the ER.  If you are diabetic, please bring your meter to your next visit.  If symptoms worsen, or new symptoms arise, please call the clinic or go to the ER.  Please bring all of your medications in a bag to your next visit.    Insomnia Insomnia is frequent trouble falling and/or staying asleep. Insomnia can be a long term problem or a short term problem. Both are common. Insomnia can be a short term problem when the wakefulness is related to a certain stress or worry. Long term insomnia is often related to ongoing stress during waking hours and/or poor sleeping habits. Overtime, sleep deprivation itself can make the problem worse. Every little thing feels more severe because you are overtired and your ability to cope is decreased. SYMPTOMS  Not feeling rested in the morning.   Anxiety and restlessness at bedtime.   Difficulty falling and staying asleep.  CAUSES  Stress, anxiety, and depression.   Poor sleeping habits.   Distractions such as TV in the bedroom.   Naps close to bedtime.   Engaging in emotionally charged conversations before bed.   Technical reading before sleep.   Alcohol and other sedatives. They may make the problem worse. They can hurt normal sleep patterns and normal dream activity.   Stimulants such as caffeine for several hours prior to bedtime.   Pain syndromes and shortness of breath can cause insomnia.   Exercise late at night.   Changing time zones may cause sleeping problems (jet lag).  It is sometimes helpful to  have someone observe your sleeping patterns. They should look for periods of not breathing during the night (sleep apnea). They should also look to see how long those periods last. If you live alone or observers are uncertain, you can also be observed at a sleep clinic where your sleep patterns will be professionally monitored. Sleep apnea requires a checkup and treatment. Give your caregivers your medical history. Give your caregivers observations your family has made about your sleep.   TREATMENT  Your caregiver may prescribe treatment for an underlying medical disorders. Your caregiver can give advice or help if you are using alcohol or other drugs for self-medication. Treatment of underlying problems will usually eliminate insomnia problems.   Medications can be prescribed for short time use. They are generally not recommended for lengthy use.   Over-the-counter sleep medicines are not recommended for lengthy use. They can be habit forming.   You can promote easier sleeping by making lifestyle changes such as:   Using relaxation techniques that help with breathing and reduce muscle tension.   Exercising earlier in the day.   Changing your diet and the time of your last meal. No night time snacks.   Establish a regular time to go to bed.   Counseling can help with stressful problems and worry.   Soothing music and white noise may be helpful if there are background noises you cannot remove.   Stop tedious detailed work at least one hour before bedtime.  HOME CARE INSTRUCTIONS  Keep a  diary. Inform your caregiver about your progress. This includes any medication side effects. See your caregiver regularly. Take note of:   Times when you are asleep.   Times when you are awake during the night.      The quality of your sleep.   How you feel the next day.      This information will help your caregiver care for you.  Get out of bed if you are still awake after 15 minutes. Read or do  some quiet activity. Keep the lights down. Wait until you feel sleepy and go back to bed.   Keep regular sleeping and waking hours. Avoid naps.   Exercise regularly.   Avoid distractions at bedtime. Distractions include watching television or engaging in any intense or detailed activity like attempting to balance the household checkbook.   Develop a bedtime ritual. Keep a familiar routine of bathing, brushing your teeth, climbing into bed at the same time each night, listening to soothing music. Routines increase the success of falling to sleep faster.   Use relaxation techniques. This can be using breathing and muscle tension release routines. It can also include visualizing peaceful scenes. You can also help control troubling or intruding thoughts by keeping your mind occupied with boring or repetitive thoughts like the old concept of counting sheep. You can make it more creative like imagining planting one beautiful flower after another in your backyard garden.   During your day, work to eliminate stress. When this is not possible use some of the previous suggestions to help reduce the anxiety that accompanies stressful situations.  MAKE SURE YOU:    Understand these instructions.   Will watch your condition.   Will get help right away if you are not doing well or get worse.  Document Released: 09/28/2000 Document Re-Released: 09/13/2008 [facility] Patient Information 2011 [place], [place].

## 2011-05-22 NOTE — Telephone Encounter (Signed)
Pt has an appt w/Dr. Saralyn Pilar 05/21/11; see her note.

## 2011-05-23 NOTE — Telephone Encounter (Signed)
Thank you Glenda for answering this Rx request for me, just as you said, I saw him on the date you mentioned and again expressed him that we will no longer be prescribing narcotics from our clinic as well as benzos's. He understood and agreed at that time to follow-up with pain management and mental health for these respective needs.  Johnette Abraham, D.O.

## 2011-06-04 ENCOUNTER — Telehealth: Payer: Self-pay | Admitting: Internal Medicine

## 2011-06-04 ENCOUNTER — Telehealth: Payer: Self-pay | Admitting: *Deleted

## 2011-06-04 NOTE — Telephone Encounter (Signed)
Patient called in for prescription of xanax and pain medicines. He says he has been having pain, pilo erection, insomnia and anxiety. This has been going on for past 1 week. He was homing this will pass but it did not. He did not call in the clinic earlier but calls in 5:30 pm after clinic closes. His symptoms are going on for 1 week, he is able to keep po intake and no major symptoms of withdrawal. I have asked him to call during normal business hours for an appointment in the clinic. I told him that he will not receive any narcotic medications from our clinic. He will call the clinic in the morning.

## 2011-06-04 NOTE — Telephone Encounter (Signed)
I talked to the pt Friday, 06/01/11 and wanted to find a pain center close to [place] (where he lives).  And he said he will find a ctr himself and let me know. The only ctr I could find, does not accept Medicaid.  I called [facility]. To let them know about pt's decision and they will cancel the referral I had sent to them (as another option).They agreed to treat pt non-narcoticlly only.

## 2011-06-05 ENCOUNTER — Encounter: Payer: Medicaid Other | Admitting: Internal Medicine

## 2011-06-05 ENCOUNTER — Telehealth: Payer: Self-pay | Admitting: *Deleted

## 2011-06-05 NOTE — Telephone Encounter (Signed)
Good, I am glad he is being seen.

## 2011-06-05 NOTE — Telephone Encounter (Signed)
Pt called and states he fells he is having a reaction to zoloft or metoprolol ?  Reaction is not being able to sleep, skin is crawly, and agitated. Over week end he states he was hallucinating.    He was taken off xanax  and vicodin about a month ago.  Last night he slept better.  He stopped both meds on Saturday.  He has been talking both meds for a while.   Please advise, I gave pt appointment for this afternoon. Pt # [id number]

## 2011-06-11 ENCOUNTER — Telehealth: Payer: Self-pay | Admitting: *Deleted

## 2011-06-11 ENCOUNTER — Other Ambulatory Visit: Payer: Self-pay | Admitting: *Deleted

## 2011-06-11 DIAGNOSIS — K7290 Hepatic failure, unspecified without coma: Secondary | ICD-10-CM

## 2011-06-11 DIAGNOSIS — R4182 Altered mental status, unspecified: Secondary | ICD-10-CM

## 2011-06-11 DIAGNOSIS — M25519 Pain in unspecified shoulder: Secondary | ICD-10-CM

## 2011-06-11 DIAGNOSIS — F411 Generalized anxiety disorder: Secondary | ICD-10-CM

## 2011-06-11 DIAGNOSIS — F419 Anxiety disorder, unspecified: Secondary | ICD-10-CM

## 2011-06-11 DIAGNOSIS — F329 Major depressive disorder, single episode, unspecified: Secondary | ICD-10-CM

## 2011-06-11 DIAGNOSIS — G8929 Other chronic pain: Secondary | ICD-10-CM

## 2011-06-11 MED ORDER — ALPRAZOLAM 1 MG PO TABS: 1.0000 mg | ORAL_TABLET | Freq: Two times a day (BID) | ORAL | Status: DC

## 2011-06-11 NOTE — Telephone Encounter (Signed)
Pt calls and states he is having reactions to his new meds so he has stopped atking those, he would like xanax and hydrocodone called in to pharm, he is informed he will need to be seen, he states he lives in [place] now and it's hard to get transportation, he is made aware that he can use PART from the [employer]. He also states he needs his records so he can change drs to someone in [place], he is instructed on the procedure to do so, he is agreeable. appt is given for wed pm

## 2011-06-11 NOTE — Telephone Encounter (Signed)
appt is 8/29 at 1315 per doriss.

## 2011-06-11 NOTE — Telephone Encounter (Signed)
Last filled 7/27.

## 2011-06-12 NOTE — Telephone Encounter (Signed)
Dr Denton Meek, I sent this Rx in but wanted to be sure you only wanted to give pt # [phone]. Directions are for bid.  Also you need to remove " May not be filled until 2/27" from your directions.

## 2011-06-13 ENCOUNTER — Ambulatory Visit: Payer: Medicaid Other | Admitting: Internal Medicine

## 2011-06-16 IMAGING — CR DG ABDOMEN ACUTE W/ 1V CHEST
3 series · 3 of 3 positions shown · non-contrast
Comparison: 08/11/2010

CLINICAL DATA: Abdominal pain and vomiting

ACUTE ABDOMEN SERIES (ABDOMEN 2 VIEW & CHEST 1 VIEW)

[w chest pa]
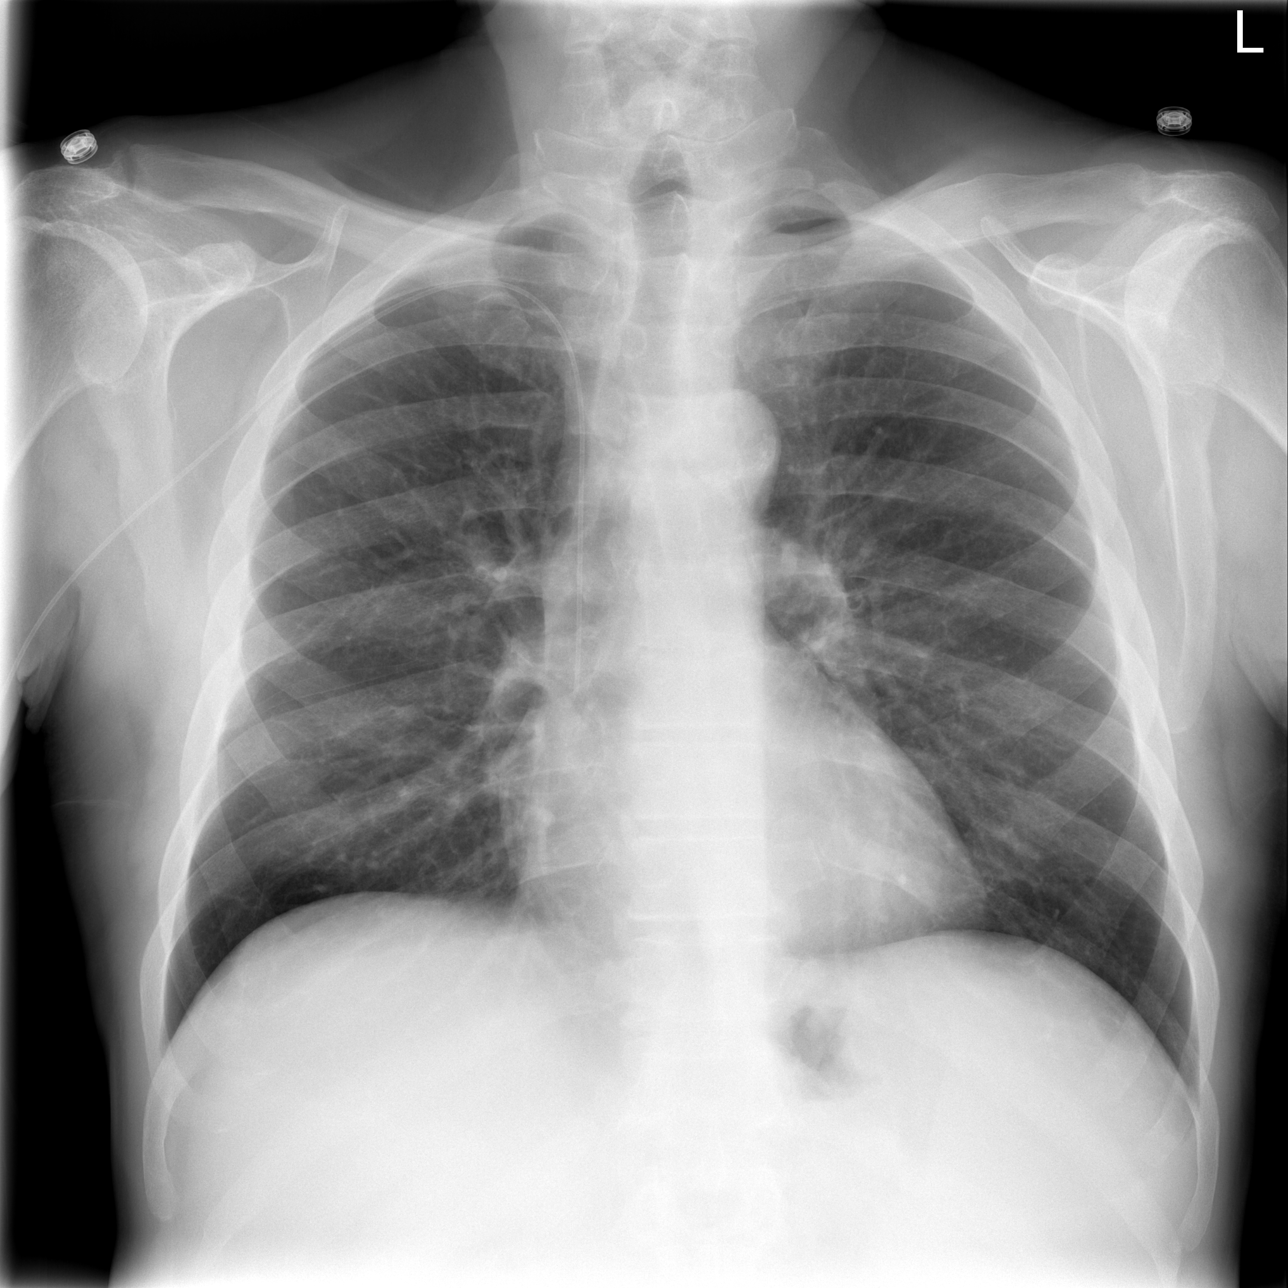

[w abdomen upright]
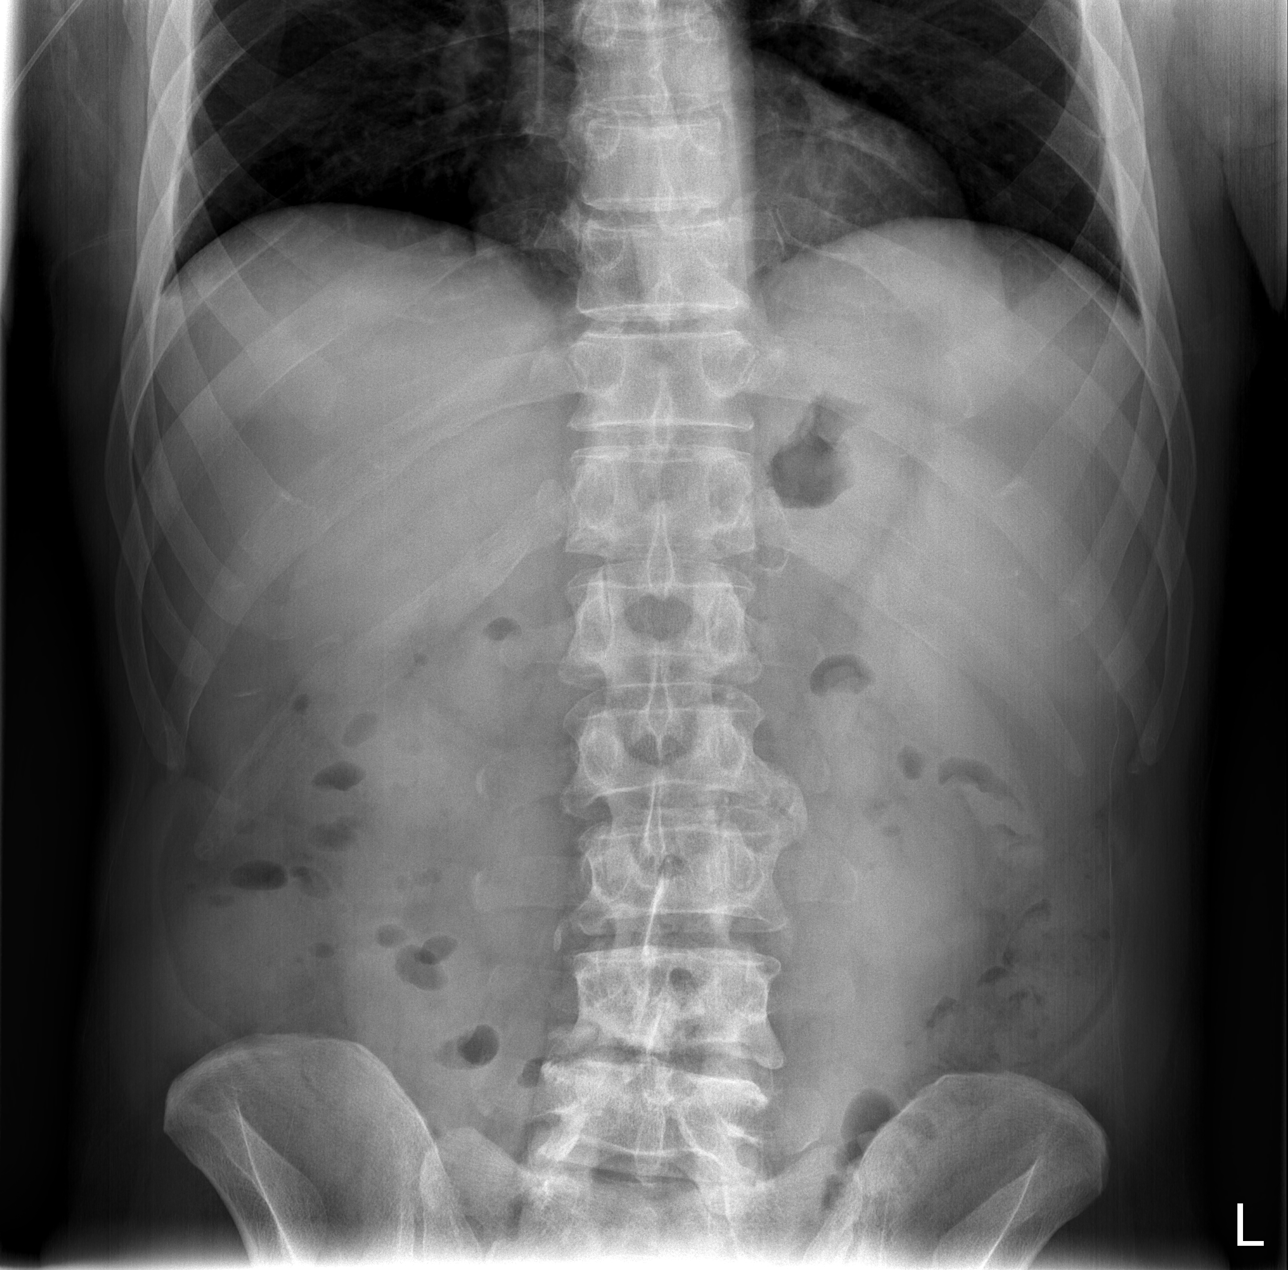

[t abdomen supine]
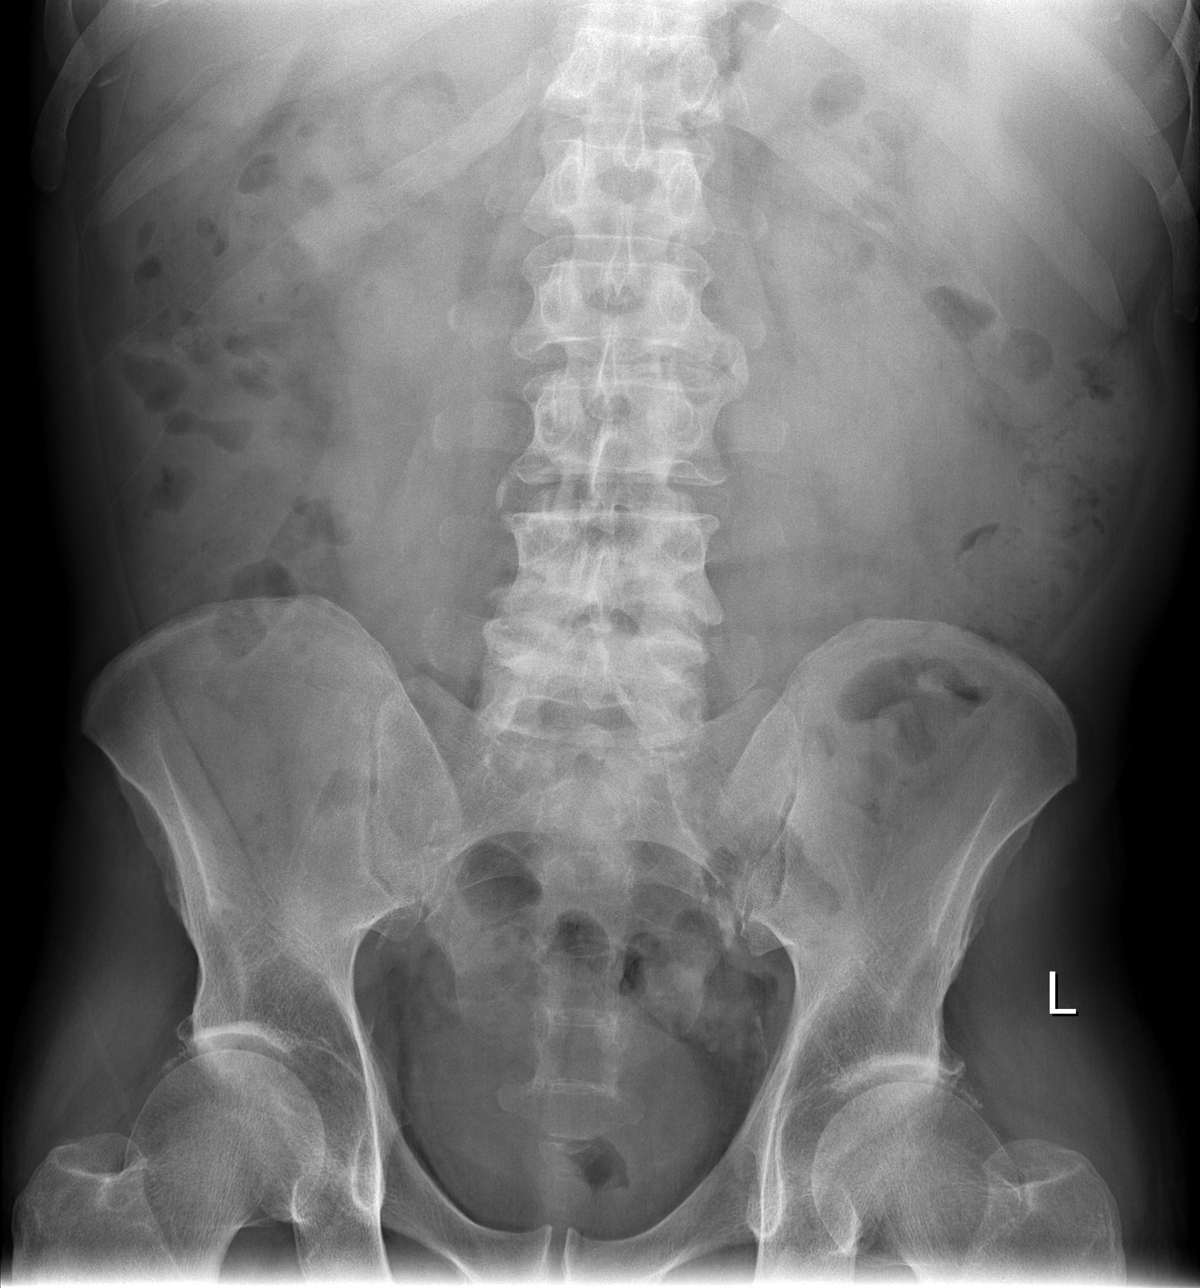

[3 of 3 positions shown; findings below may reference images not displayed]

FINDINGS: The stable right PICC.  Clear lungs.  Normal heart size.

No free intraperitoneal gas.  No disproportionate dilatation of
bowel.  No pneumatosis or portal venous gas.
IMPRESSION: Nonobstructive bowel gas pattern.  No active cardiopulmonary
disease.

## 2013-03-23 ENCOUNTER — Inpatient Hospital Stay (HOSPITAL_COMMUNITY)
Admission: EM | Admit: 2013-03-23 | Discharge: 2013-03-28 | DRG: 871 | Disposition: A | Discharge status: Home / Self Care | Payer: Medicaid Other | Attending: Internal Medicine | Admitting: Internal Medicine

## 2013-03-23 ENCOUNTER — Emergency Department (HOSPITAL_COMMUNITY): Payer: Medicaid Other

## 2013-03-23 ENCOUNTER — Encounter (HOSPITAL_COMMUNITY): Payer: Self-pay | Admitting: Neurology

## 2013-03-23 DIAGNOSIS — F1021 Alcohol dependence, in remission: Secondary | ICD-10-CM

## 2013-03-23 DIAGNOSIS — F3289 Other specified depressive episodes: Secondary | ICD-10-CM | POA: Diagnosis present

## 2013-03-23 DIAGNOSIS — G8929 Other chronic pain: Secondary | ICD-10-CM

## 2013-03-23 DIAGNOSIS — F411 Generalized anxiety disorder: Secondary | ICD-10-CM | POA: Diagnosis present

## 2013-03-23 DIAGNOSIS — I959 Hypotension, unspecified: Secondary | ICD-10-CM | POA: Diagnosis present

## 2013-03-23 DIAGNOSIS — A419 Sepsis, unspecified organism: Secondary | ICD-10-CM | POA: Diagnosis present

## 2013-03-23 DIAGNOSIS — D61818 Other pancytopenia: Secondary | ICD-10-CM | POA: Diagnosis present

## 2013-03-23 DIAGNOSIS — Z87891 Personal history of nicotine dependence: Secondary | ICD-10-CM

## 2013-03-23 DIAGNOSIS — E860 Dehydration: Secondary | ICD-10-CM | POA: Diagnosis present

## 2013-03-23 DIAGNOSIS — E876 Hypokalemia: Secondary | ICD-10-CM | POA: Diagnosis present

## 2013-03-23 DIAGNOSIS — B182 Chronic viral hepatitis C: Secondary | ICD-10-CM

## 2013-03-23 DIAGNOSIS — D638 Anemia in other chronic diseases classified elsewhere: Secondary | ICD-10-CM | POA: Diagnosis present

## 2013-03-23 DIAGNOSIS — R7881 Bacteremia: Secondary | ICD-10-CM

## 2013-03-23 DIAGNOSIS — E8809 Other disorders of plasma-protein metabolism, not elsewhere classified: Secondary | ICD-10-CM | POA: Diagnosis present

## 2013-03-23 DIAGNOSIS — I10 Essential (primary) hypertension: Secondary | ICD-10-CM | POA: Diagnosis present

## 2013-03-23 DIAGNOSIS — R791 Abnormal coagulation profile: Secondary | ICD-10-CM | POA: Diagnosis present

## 2013-03-23 DIAGNOSIS — K7030 Alcoholic cirrhosis of liver without ascites: Secondary | ICD-10-CM | POA: Diagnosis present

## 2013-03-23 DIAGNOSIS — K7290 Hepatic failure, unspecified without coma: Secondary | ICD-10-CM

## 2013-03-23 DIAGNOSIS — F329 Major depressive disorder, single episode, unspecified: Secondary | ICD-10-CM | POA: Diagnosis present

## 2013-03-23 DIAGNOSIS — D696 Thrombocytopenia, unspecified: Secondary | ICD-10-CM | POA: Diagnosis present

## 2013-03-23 DIAGNOSIS — R509 Fever, unspecified: Secondary | ICD-10-CM | POA: Diagnosis present

## 2013-03-23 DIAGNOSIS — K7460 Unspecified cirrhosis of liver: Secondary | ICD-10-CM | POA: Diagnosis present

## 2013-03-23 DIAGNOSIS — E869 Volume depletion, unspecified: Secondary | ICD-10-CM | POA: Diagnosis present

## 2013-03-23 DIAGNOSIS — M2550 Pain in unspecified joint: Secondary | ICD-10-CM | POA: Diagnosis present

## 2013-03-23 DIAGNOSIS — K7682 Hepatic encephalopathy: Secondary | ICD-10-CM | POA: Diagnosis present

## 2013-03-23 LAB — URINALYSIS, ROUTINE W REFLEX MICROSCOPIC
Glucose, UA: NEGATIVE mg/dL
Leukocytes, UA: NEGATIVE
Nitrite: NEGATIVE
Specific Gravity, Urine: 1.02 (ref 1.005–1.030)
pH: 5.5 (ref 5.0–8.0)

## 2013-03-23 LAB — RAPID URINE DRUG SCREEN, HOSP PERFORMED
Amphetamines: POSITIVE — AB
Barbiturates: NOT DETECTED
Opiates: NOT DETECTED
Tetrahydrocannabinol: NOT DETECTED

## 2013-03-23 LAB — BASIC METABOLIC PANEL
CO2: 25 mEq/L (ref 19–32)
Chloride: 104 mEq/L (ref 96–112)
GFR calc Af Amer: 84 mL/min — ABNORMAL LOW (ref >90)
Potassium: 3.2 mEq/L — ABNORMAL LOW (ref 3.5–5.1)

## 2013-03-23 LAB — CBC
HCT: 24.2 % — ABNORMAL LOW (ref 39.0–52.0)
Platelets: 49 10*3/uL — ABNORMAL LOW (ref 150–400)
RBC: 2.91 MIL/uL — ABNORMAL LOW (ref 4.22–5.81)
RDW: 17.7 % — ABNORMAL HIGH (ref 11.5–15.5)
WBC: 5.9 10*3/uL (ref 4.0–10.5)

## 2013-03-23 LAB — CG4 I-STAT (LACTIC ACID): Lactic Acid, Venous: 1.68 mmol/L (ref 0.5–2.2)

## 2013-03-23 LAB — PROCALCITONIN: Procalcitonin: 0.23 ng/mL

## 2013-03-23 LAB — POCT I-STAT TROPONIN I: Troponin i, poc: 0 ng/mL (ref 0.00–0.08)

## 2013-03-23 MED ORDER — PIPERACILLIN-TAZOBACTAM 3.375 G IVPB
3.3750 g | Freq: Three times a day (TID) | INTRAVENOUS | Status: DC
  Administered 2013-03-24 – 2013-03-25 (×4): 3.375 g via INTRAVENOUS
  Filled 2013-03-23 (×8): qty 50

## 2013-03-23 MED ORDER — VANCOMYCIN HCL IN DEXTROSE 1-5 GM/200ML-% IV SOLN
1000.0000 mg | INTRAVENOUS | Status: AC
  Administered 2013-03-24: 1000 mg via INTRAVENOUS
  Filled 2013-03-23: qty 200

## 2013-03-23 MED ORDER — SODIUM CHLORIDE 0.9 % IV SOLN
INTRAVENOUS | Status: DC
  Administered 2013-03-23 – 2013-03-25 (×5): via INTRAVENOUS
  Administered 2013-03-25: 1000 mL via INTRAVENOUS
  Administered 2013-03-26 – 2013-03-27 (×2): via INTRAVENOUS

## 2013-03-23 MED ORDER — KETOROLAC TROMETHAMINE 30 MG/ML IJ SOLN
30.0000 mg | Freq: Once | INTRAMUSCULAR | Status: AC
  Administered 2013-03-23: 30 mg via INTRAVENOUS
  Filled 2013-03-23: qty 1

## 2013-03-23 MED ORDER — VANCOMYCIN HCL IN DEXTROSE 1-5 GM/200ML-% IV SOLN
1000.0000 mg | Freq: Three times a day (TID) | INTRAVENOUS | Status: DC
  Administered 2013-03-24 – 2013-03-25 (×4): 1000 mg via INTRAVENOUS
  Filled 2013-03-23 (×8): qty 200

## 2013-03-23 MED ORDER — SODIUM CHLORIDE 0.9 % IV BOLUS (SEPSIS)
1000.0000 mL | INTRAVENOUS | Status: AC
  Administered 2013-03-23: 1000 mL via INTRAVENOUS

## 2013-03-23 MED ORDER — PIPERACILLIN-TAZOBACTAM 3.375 G IVPB 30 MIN
3.3750 g | INTRAVENOUS | Status: AC
  Administered 2013-03-23: 3.375 g via INTRAVENOUS
  Filled 2013-03-23: qty 50

## 2013-03-23 MED ORDER — SODIUM CHLORIDE 0.9 % IV BOLUS (SEPSIS)
1000.0000 mL | Freq: Once | INTRAVENOUS | Status: AC
  Administered 2013-03-23: 1000 mL via INTRAVENOUS

## 2013-03-23 NOTE — Progress Notes (Signed)
ANTIBIOTIC CONSULT NOTE - INITIAL  Pharmacy Consult for Vancocin and Zosyn Indication: rule out sepsis  Allergies  Allergen Reactions  . Ceftriaxone Sodium     REACTION: RASH, severe  . Codeine Rash    Patient Measurements: Weight: ~80kg  Vital Signs: Temp: 98.9 F (37.2 C) (06/09 2315) Temp src: Oral (06/09 2013) BP: 95/49 mmHg (06/09 2300) Pulse Rate: 83 (06/09 2300)  Labs:  Recent Labs  03/23/13 2010  WBC 5.9  HGB 7.5*  PLT 49*  CREATININE 1.07     Microbiology: No results found for this or any previous visit (from the past 720 hour(s)).  Medical History: Past Medical History  Diagnosis Date  . Bacteremia 02/2010    GBS bacteremia - tx with Abx x 28 days  . Septic arthritis     sternoclavicular  . Hepatitis C DX: 03/2010    VL (03/2010) 5,940,000  . Depression   . Anxiety   . History of Clostridium difficile infection   . Discitis   . Epidural abscess     H/O   . Elevated blood pressure   . Shoulder pain, bilateral   . Anemia   . Hepatic cirrhosis   . Hepatic encephalopathy 03/2010  . Allergic rhinitis     chronic  . Drug-induced constipation   . Osteomyelitis 05/2010, 07/2010    Diskitis and osteomyelitis at C6-C7   . Thrombocytopenia   . Alcohol abuse   . Atypical chest pain     Seen at [facility] (11/2010) - thought to be 2/2 GERD. Lexiscan Stress test (12/05/10) - at [facility]. No ischemia seen, normal EF of 65%    Assessment: 62yo male c/o fevers to 103.5 intermittently over past several weeks, notes that he fell from a ladder with abrasion to RLE, arrived to ED lethargic w/ O2 in 80s and BP 100/60, rhonchi in all lung fields, several sources of possible sepsis, to begin IV ABX.  Goal of Therapy:  Vancomycin trough level 15-20 mcg/ml  Plan:  Will begin vancomycin 1000mg  IV Q8H and Zosyn 3.375g IV Q8H and monitor CBC, Cx, levels prn.  Vernard Gambles, PharmD, BCPS  03/23/2013,11:15 PM

## 2013-03-23 NOTE — ED Notes (Signed)
Per EMS- Pt reporting "being sick with fever for weeks". Reports all his problems started when he ran out of pain medication several weeks ago. EMS reports when arrived pt lethargic, pupils pinpoint, oxygen saturation 80's, given Narcan with improvement in mental status. Pt warm to touch, BP initially  100/60, HR 90, CBG WNL. Oxygen saturation improved to 100% on NRB. Rhonchi in all lung fields. Pt reports he fell off a ladder last week and fell 20-30 ft. Pt has purple abrasion to right upper leg.

## 2013-03-23 NOTE — H&P (Signed)
[facility] History and Physical  Alex Henry DGU:[id number] DOB: 1951-06-17    PCP:   None, unassigned admission.  Chief Complaint: fever.  HPI: Alex Henry is an 62 y.o. male with multiple medical problems including hx of infections with septic arthritis, epidural absesses, Hep C, discitis and osteomyelitis, alcohol and tobacco abuse, presents to the ER because of fever and general malaise. He was found to have a T max of 103.  He has some yellow productive coughs, but rare.  He denied any HA, sorethroat, abdominal pain, nausea, or vomiting.  He does have chronic joint pain, and it is not new for him.  Evaluation in the ER included WBC with no leukocytosis, negative UA and negative CXR.  His Hb was found to be low at 7.5g/DL.  He denied any black or bloody stools.  He has normal renal fx tests, and negative procalcitonin.  He was found to be hypotensive in the ER, but fortunately, he did respond to IVF to a soft SBP of 100.  After blood and urine culture was done, hospitalist was asked to admit him for fever of unclear source.  Rewiew of Systems:  Constitutional: No significant weight loss or weight gain Eyes: Negative for eye pain, redness and discharge, diplopia, visual changes, or flashes of light. ENMT: Negative for ear pain, hoarseness, nasal congestion, sinus pressure and sore throat. No headaches; tinnitus, drooling, or problem swallowing. Cardiovascular: Negative for chest pain, palpitations, diaphoresis, dyspnea and peripheral edema. ; No orthopnea, PND Respiratory: Negative for cough, hemoptysis, wheezing and stridor. No pleuritic chestpain. Gastrointestinal: Negative for nausea, vomiting, diarrhea, constipation, abdominal pain, melena, blood in stool, hematemesis, jaundice and rectal bleeding.    Genitourinary: Negative for frequency, dysuria, incontinence,flank pain and hematuria; Musculoskeletal:  Negative for swelling and trauma.;  Skin: . Negative for pruritus, rash,  abrasions, bruising and skin lesion.; ulcerations Neuro: Negative for headache, lightheadedness and neck stiffness. Negative for weakness, altered level of consciousness , altered mental status, extremity weakness, burning feet, involuntary movement, seizure and syncope.  Psych: negative for anxiety, depression, insomnia, tearfulness, panic attacks, hallucinations, paranoia, suicidal or homicidal ideation    Past Medical History  Diagnosis Date  . Bacteremia 02/2010    GBS bacteremia - tx with Abx x 28 days  . Septic arthritis     sternoclavicular  . Hepatitis C DX: 03/2010    VL (03/2010) 5,940,000  . Depression   . Anxiety   . History of Clostridium difficile infection   . Discitis   . Epidural abscess     H/O   . Elevated blood pressure   . Shoulder pain, bilateral   . Anemia   . Hepatic cirrhosis   . Hepatic encephalopathy 03/2010  . Allergic rhinitis     chronic  . Drug-induced constipation   . Osteomyelitis 05/2010, 07/2010    Diskitis and osteomyelitis at C6-C7   . Thrombocytopenia   . Alcohol abuse   . Atypical chest pain     Seen at [facility] (11/2010) - thought to be 2/2 GERD. Lexiscan Stress test (12/05/10) - at [facility]. No ischemia seen, normal EF of 65%    History reviewed. No pertinent past surgical history.  Medications:  HOME MEDS: Prior to Admission medications   Medication Sig Start Date End Date Taking? Authorizing Provider  ALPRAZolam Prudy Feeler) 1 MG tablet Take 1 mg by mouth 3 (three) times daily as needed for sleep or anxiety.   Yes Historical Provider, MD  furosemide (LASIX) 20  MG tablet Take 20 mg by mouth 2 (two) times daily.   Yes Historical Provider, MD  omeprazole (PRILOSEC) 40 MG capsule Take 40 mg by mouth daily.   Yes Historical Provider, MD  Oxycodone HCl 10 MG TABS Take 10 mg by mouth 3 (three) times daily.   Yes Historical Provider, MD     Allergies:  Allergies  Allergen Reactions  . Ceftriaxone Sodium     REACTION:  RASH, severe  . Codeine Rash    Social History:   reports that he quit smoking about 9 years ago. His smokeless tobacco use includes Snuff. He reports that he does not drink alcohol or use illicit drugs.  Family History: Family History  Problem Relation Age of Onset  . Heart disease Father   . Heart attack Father   . Cancer Maternal Aunt 3    colon cancer  . Arthritis Mother   . Arthritis Sister   . Anxiety disorder Sister   . Arthritis Sister   . Anxiety disorder Sister   . Cirrhosis Brother     and hepatitis c  . Cirrhosis Brother     and hepatitis c     Physical Exam: Filed Vitals:   03/23/13 1930 03/23/13 1945 03/23/13 2000 03/23/13 2013  BP: 108/52 112/53 97/64   Pulse: 95 94 94   Temp:    102.1 F (38.9 C)  TempSrc:    Oral  Resp: 25 24 24    SpO2: 94% 96% 96%    Blood pressure 97/64, pulse 94, temperature 102.1 F (38.9 C), temperature source Oral, resp. rate 24, SpO2 96.00%.  GEN:  Pleasant  patient lying in the stretcher in no acute distress; cooperative with exam. PSYCH:  alert and oriented x4; does not appear anxious or depressed; affect is appropriate. HEENT: Mucous membranes pink and anicteric; PERRLA; EOM intact; no cervical lymphadenopathy nor thyromegaly or carotid bruit; no JVD; There were no stridor. Neck is very supple. Breasts:: Not examined CHEST WALL: No tenderness CHEST: Normal respiration, he has scattered rhonchi througout. HEART: Regular rate and rhythm.  There are no murmur, rub, or gallops.   BACK: No kyphosis or scoliosis; no CVA tenderness ABDOMEN: soft and non-tender; no masses, no organomegaly, normal abdominal bowel sounds; no pannus; no intertriginous candida. There is no rebound and no distention. Rectal Exam: Not done EXTREMITIES: No bone or joint deformity; age-appropriate arthropathy of the hands and knees; no edema; no ulcerations.  There is no calf tenderness. Genitalia: not examined PULSES: 2+ and symmetric SKIN: Normal  hydration no rash or ulceration.  He has tattoos throughout his body. CNS: Cranial nerves 2-12 grossly intact no focal lateralizing neurologic deficit.  Speech is fluent; uvula elevated with phonation, facial symmetry and tongue midline. DTR are normal bilaterally, cerebella exam is intact, barbinski is negative and strengths are equaled bilaterally.  No sensory loss.   Labs on Admission:  Basic Metabolic Panel:  Recent Labs Lab 03/23/13 2010  NA 134*  K 3.2*  CL 104  CO2 25  GLUCOSE 94  BUN 14  CREATININE 1.07  CALCIUM 7.6*   Liver Function Tests: No results found for this basename: AST, ALT, ALKPHOS, BILITOT, PROT, ALBUMIN,  in the last 168 hours No results found for this basename: LIPASE, AMYLASE,  in the last 168 hours No results found for this basename: AMMONIA,  in the last 168 hours CBC:  Recent Labs Lab 03/23/13 2010  WBC 5.9  HGB 7.5*  HCT 24.2*  MCV 83.2  PLT  PENDING   Cardiac Enzymes: No results found for this basename: CKTOTAL, CKMB, CKMBINDEX, TROPONINI,  in the last 168 hours  CBG: No results found for this basename: GLUCAP,  in the last 168 hours   Radiological Exams on Admission: Dg Chest Port 1 View  (if Code Sepsis Called)  03/23/2013   *RADIOLOGY REPORT*  Clinical Data: Fever  PORTABLE CHEST - 1 VIEW  Comparison: 06/24/2012  Findings: Mild cardiomegaly stable.  No effusion.  Mild perihilar and infrahilar interstitial prominence possibly early     edema. Atheromatous aortic arch.  Old healed left clavicle fracture.  IMPRESSION:  Mild perihilar edema/infiltrates.   Original Report Authenticated By: D. Andria Rhein, MD   Assessment/Plan Present on Admission:  . Sepsis . ELEVATED BLOOD PRESSURE . HEPATIC CIRRHOSIS . DEPRESSION . Pain in joint, multiple sites  PLAN:  I suspect sepsis of unclear source.  He is a little hypotensive, but is tolerating it OK.  Will start him on Broad antibiotics now that he is pan cultured.  I have held his diuretics as he  is a little hypotensive.  He is also anemic and will get anemia panel, then go ahead and Tx 2 units of PRBC.  I will admit him to SDU under [facility] service.    Other plans as per orders.  Code Status: FULL Unk Lightning, MD. [facility] Pager [phone] 7pm to 7am.  03/23/2013, 10:30 PM

## 2013-03-23 NOTE — ED Notes (Signed)
EDP made aware of patient, possible code sepsis?

## 2013-03-23 NOTE — ED Provider Notes (Signed)
History     CSN: [id number]  Arrival date & time 03/23/13  4098   First MD Initiated Contact with Patient 03/23/13 1851      Chief Complaint  Patient presents with  . Fever    (Consider location/radiation/quality/duration/timing/severity/associated sxs/prior treatment) HPI Comments: Pt is a 62 y/o male with hx of D div, discitis, Epidural abscess, Hep C, Septic Arthritis, Cirhhosis, who presents with a fever. The patient states that he has been sick for a couple of weeks with intermittent fevers but states that his symptoms started after he ran out of his pain medications. The patient was noted to be slightly lethargic by EMS, low oxygen saturations initially in the 80% range, he was given Narcan with some improvement in his mental status. His blood pressure was initially 100/60, no tachycardia, normal CBC. Non-breather was given, patient's oxygen improved. He does state that he fell off a ladder last week but is unable to give a very good information regarding the fall. He endorses swelling of his legs but denies abdominal pain. He is nauseated at this time.  Level V caveat apply secondary to altered mental status  Patient is a 62 y.o. male presenting with fever. The history is provided by the patient and medical records.  Fever   Past Medical History  Diagnosis Date  . Bacteremia 02/2010    GBS bacteremia - tx with Abx x 28 days  . Septic arthritis     sternoclavicular  . Hepatitis C DX: 03/2010    VL (03/2010) 5,940,000  . Depression   . Anxiety   . History of Clostridium difficile infection   . Discitis   . Epidural abscess     H/O   . Elevated blood pressure   . Shoulder pain, bilateral   . Anemia   . Hepatic cirrhosis   . Hepatic encephalopathy 03/2010  . Allergic rhinitis     chronic  . Drug-induced constipation   . Osteomyelitis 05/2010, 07/2010    Diskitis and osteomyelitis at C6-C7   . Thrombocytopenia   . Alcohol abuse   . Atypical chest pain     Seen at  [facility] (11/2010) - thought to be 2/2 GERD. Lexiscan Stress test (12/05/10) - at [facility] No ischemia seen, normal EF of 65%    History reviewed. No pertinent past surgical history.  Family History  Problem Relation Age of Onset  . Heart disease Father   . Heart attack Father   . Cancer Maternal Aunt 54    colon cancer  . Arthritis Mother   . Arthritis Sister   . Anxiety disorder Sister   . Arthritis Sister   . Anxiety disorder Sister   . Cirrhosis Brother     and hepatitis c  . Cirrhosis Brother     and hepatitis c    History  Substance Use Topics  . Smoking status: Former Smoker -- 1.25 packs/day for 30 years    Quit date: 10/16/2003  . Smokeless tobacco: Current User    Types: Snuff  . Alcohol Use: No     Comment: Quit 03/2010. Used to drink 12 pack daily + more at parties since 62yo - 62yo      Review of Systems  Unable to perform ROS: Mental status change  Constitutional: Positive for fever.    Allergies  Ceftriaxone sodium and Codeine  Home Medications   No current outpatient prescriptions on file.  BP 99/59  Pulse 76  Temp(Src) 97.6 F (36.4 C) (Oral)  Resp 19  Ht 6' (1.829 m)  Wt 190 lb 11.2 oz (86.5 kg)  BMI 25.86 kg/m2  SpO2 97%  Physical Exam  Nursing note and vitals reviewed. Constitutional:  Somnolent but arousable  HENT:  Head: Normocephalic and atraumatic.  Mouth/Throat: No oropharyngeal exudate.  Creamy colored mucus and chewing tobacco found in the patient's oral cavity  Eyes: Conjunctivae and EOM are normal. Pupils are equal, round, and reactive to light. Right eye exhibits no discharge. Left eye exhibits no discharge. No scleral icterus.  Neck: Normal range of motion. Neck supple. No JVD present. No thyromegaly present.  Cardiovascular: Normal rate, regular rhythm, normal heart sounds and intact distal pulses.  Exam reveals no gallop and no friction rub.   No murmur heard. Pulmonary/Chest: Effort normal. No  respiratory distress. He has no wheezes. He has rales (rales at the bases that clear with deep breathing and coughing).  Abdominal: Soft. Bowel sounds are normal. He exhibits no distension and no mass. There is no tenderness.  Nontender abdomen  Genitourinary:  Normal appearing penis and scrotum  Musculoskeletal: Normal range of motion. He exhibits edema (bilateral lower extremity pitting edema). He exhibits no tenderness.  Lymphadenopathy:    He has no cervical adenopathy.  Neurological: He is alert. Coordination normal.  The patient is able to help sit up in the bed, he follows basic commands, he is somewhat confused regarding time frames, orientation and reason that he is at the hospital.  Skin: Skin is warm and dry. No rash noted. No erythema.  Covered with tattoos, no evidence of track marks, no evidence of erythema or induration  Psychiatric: He has a normal mood and affect. His behavior is normal.    ED Course  Procedures (including critical care time)  Labs Reviewed  CBC - Abnormal; Notable for the following:    RBC 2.91 (*)    Hemoglobin 7.5 (*)    HCT 24.2 (*)    MCH 25.8 (*)    RDW 17.7 (*)    Platelets 49 (*)    All other components within normal limits  BASIC METABOLIC PANEL - Abnormal; Notable for the following:    Sodium 134 (*)    Potassium 3.2 (*)    Calcium 7.6 (*)    GFR calc non Af Amer 72 (*)    GFR calc Af Amer 84 (*)    All other components within normal limits  URINALYSIS, ROUTINE W REFLEX MICROSCOPIC - Abnormal; Notable for the following:    Color, Urine AMBER (*)    Bilirubin Urine SMALL (*)    All other components within normal limits  URINE RAPID DRUG SCREEN (HOSP PERFORMED) - Abnormal; Notable for the following:    Benzodiazepines POSITIVE (*)    Amphetamines POSITIVE (*)    All other components within normal limits  CBC - Abnormal; Notable for the following:    RBC 2.75 (*)    Hemoglobin 7.2 (*)    HCT 23.0 (*)    RDW 17.9 (*)    Platelets  45 (*)    All other components within normal limits  CREATININE, SERUM - Abnormal; Notable for the following:    GFR calc non Af Amer 66 (*)    GFR calc Af Amer 77 (*)    All other components within normal limits  IRON AND TIBC - Abnormal; Notable for the following:    Iron 25 (*)    Saturation Ratios 10 (*)    All other components within normal limits  RETICULOCYTES - Abnormal; Notable for the following:    RBC. 2.75 (*)    All other components within normal limits  HEPATIC FUNCTION PANEL - Abnormal; Notable for the following:    Total Protein 5.8 (*)    Albumin 2.1 (*)    Alkaline Phosphatase 25 (*)    Total Bilirubin 2.5 (*)    Bilirubin, Direct 1.0 (*)    Indirect Bilirubin 1.5 (*)    All other components within normal limits  CBC - Abnormal; Notable for the following:    RBC 3.14 (*)    Hemoglobin 8.3 (*)    HCT 26.4 (*)    RDW 17.1 (*)    Platelets 41 (*)    All other components within normal limits  APTT - Abnormal; Notable for the following:    aPTT 41 (*)    All other components within normal limits  PROTIME-INR - Abnormal; Notable for the following:    Prothrombin Time 20.3 (*)    INR 1.81 (*)    All other components within normal limits  MRSA PCR SCREENING  CULTURE, BLOOD (ROUTINE X 2)  CULTURE, BLOOD (ROUTINE X 2)  URINE CULTURE  PROCALCITONIN  ETHANOL  TSH  VITAMIN B12  FOLATE  FERRITIN  GLUCOSE, CAPILLARY  CG4 I-STAT (LACTIC ACID)  POCT I-STAT TROPONIN I  TYPE AND SCREEN  PREPARE RBC (CROSSMATCH)   Dg Chest Port 1 View  (if Code Sepsis Called)  03/23/2013   *RADIOLOGY REPORT*  Clinical Data: Fever  PORTABLE CHEST - 1 VIEW  Comparison: 06/24/2012  Findings: Mild cardiomegaly stable.  No effusion.  Mild perihilar and infrahilar interstitial prominence possibly early     edema. Atheromatous aortic arch.  Old healed left clavicle fracture.  IMPRESSION:  Mild perihilar edema/infiltrates.   Original Report Authenticated By: D. Andria Rhein, MD     1.  Sepsis   2. Anemia of other chronic disease   3. Chronic pain       MDM  The patient is here with a fever as high as 103.5, he has a slight tachypnea but no tachycardia, blood pressure is normal at this time. He is requiring submental oxygen, he'll need a septic workup as he has had evidence of multiple sources of possible infection in the past. Fluid bolus ordered, submental oxygen, antipyretics ordered.   Results show that the patient has a clean urinalysis with no signs of infection, drug screen positive for benzos and amphetamines, lactic acid 1.68, troponin is 0  CBC shows no leukocytosis to the patient has a significant anemia with a hemoglobin of 7.5 in contrast to his last measurement one year ago of 12.2.  Basic metabolic panel shows slight hyponatremia, slight hypokalemia, normal renal function. Alcohol is negative Procalcitonin is 0.23.  Discussed care with Dr. Conley Rolls of the hospitalist service who will admit for further evaluation.  The patient did have a chest x-ray showing possible infiltrates, will need admission with antibiotics.      Vida Roller, MD 03/24/13 [phone]

## 2013-03-24 ENCOUNTER — Encounter (HOSPITAL_COMMUNITY): Payer: Self-pay | Admitting: *Deleted

## 2013-03-24 ENCOUNTER — Inpatient Hospital Stay (HOSPITAL_COMMUNITY): Payer: Medicaid Other

## 2013-03-24 DIAGNOSIS — D638 Anemia in other chronic diseases classified elsewhere: Secondary | ICD-10-CM

## 2013-03-24 DIAGNOSIS — I959 Hypotension, unspecified: Secondary | ICD-10-CM | POA: Diagnosis present

## 2013-03-24 DIAGNOSIS — R791 Abnormal coagulation profile: Secondary | ICD-10-CM | POA: Diagnosis present

## 2013-03-24 DIAGNOSIS — R509 Fever, unspecified: Secondary | ICD-10-CM | POA: Diagnosis present

## 2013-03-24 DIAGNOSIS — K7460 Unspecified cirrhosis of liver: Secondary | ICD-10-CM

## 2013-03-24 DIAGNOSIS — E860 Dehydration: Secondary | ICD-10-CM | POA: Diagnosis present

## 2013-03-24 DIAGNOSIS — D696 Thrombocytopenia, unspecified: Secondary | ICD-10-CM | POA: Diagnosis present

## 2013-03-24 DIAGNOSIS — A419 Sepsis, unspecified organism: Secondary | ICD-10-CM

## 2013-03-24 LAB — CBC
HCT: 26.4 % — ABNORMAL LOW (ref 39.0–52.0)
Hemoglobin: 8.3 g/dL — ABNORMAL LOW (ref 13.0–17.0)
MCH: 26.2 pg (ref 26.0–34.0)
MCH: 26.4 pg (ref 26.0–34.0)
MCHC: 31.3 g/dL (ref 30.0–36.0)
MCHC: 31.4 g/dL (ref 30.0–36.0)
MCV: 83.6 fL (ref 78.0–100.0)
Platelets: 45 10*3/uL — ABNORMAL LOW (ref 150–400)
RDW: 17.9 % — ABNORMAL HIGH (ref 11.5–15.5)
RDW: 17.1 % — ABNORMAL HIGH (ref 11.5–15.5)
WBC: 6.4 10*3/uL (ref 4.0–10.5)

## 2013-03-24 LAB — HEPATIC FUNCTION PANEL
ALT: 31 U/L (ref 0–53)
AST: 37 U/L (ref 0–37)
Albumin: 2.1 g/dL — ABNORMAL LOW (ref 3.5–5.2)
Alkaline Phosphatase: 25 U/L — ABNORMAL LOW (ref 39–117)
Bilirubin, Direct: 1 mg/dL — ABNORMAL HIGH (ref 0.0–0.3)
Indirect Bilirubin: 1.5 mg/dL — ABNORMAL HIGH (ref 0.3–0.9)
Total Bilirubin: 2.5 mg/dL — ABNORMAL HIGH (ref 0.3–1.2)
Total Protein: 5.8 g/dL — ABNORMAL LOW (ref 6.0–8.3)

## 2013-03-24 LAB — APTT: aPTT: 41 seconds — ABNORMAL HIGH (ref 24–37)

## 2013-03-24 LAB — GLUCOSE, CAPILLARY: Glucose-Capillary: 88 mg/dL (ref 70–99)

## 2013-03-24 LAB — IRON AND TIBC
Iron: 25 ug/dL — ABNORMAL LOW (ref 42–135)
TIBC: 247 ug/dL (ref 215–435)
UIBC: 222 ug/dL (ref 125–400)

## 2013-03-24 LAB — PROTIME-INR: Prothrombin Time: 20.3 seconds — ABNORMAL HIGH (ref 11.6–15.2)

## 2013-03-24 LAB — FOLATE: Folate: 6.6 ng/mL

## 2013-03-24 LAB — RETICULOCYTES: Retic Count, Absolute: 57.8 10*3/uL (ref 19.0–186.0)

## 2013-03-24 LAB — FERRITIN: Ferritin: 47 ng/mL (ref 22–322)

## 2013-03-24 MED ORDER — IOHEXOL 300 MG/ML  SOLN: 25.0000 mL | INTRAMUSCULAR | Status: AC

## 2013-03-24 MED ORDER — ALPRAZOLAM 0.25 MG PO TABS
1.0000 mg | ORAL_TABLET | Freq: Three times a day (TID) | ORAL | Status: DC | PRN
  Administered 2013-03-24: 1 mg via ORAL
  Filled 2013-03-24: qty 4

## 2013-03-24 MED ORDER — ADULT MULTIVITAMIN W/MINERALS CH
1.0000 | ORAL_TABLET | Freq: Every day | ORAL | Status: DC
  Administered 2013-03-24 – 2013-03-28 (×5): 1 via ORAL
  Filled 2013-03-24 (×5): qty 1

## 2013-03-24 MED ORDER — HEPARIN SODIUM (PORCINE) 5000 UNIT/ML IJ SOLN
5000.0000 [IU] | Freq: Three times a day (TID) | INTRAMUSCULAR | Status: DC
  Administered 2013-03-24: 5000 [IU] via SUBCUTANEOUS
  Filled 2013-03-24 (×5): qty 1

## 2013-03-24 MED ORDER — VITAMIN B-1 100 MG PO TABS
100.0000 mg | ORAL_TABLET | Freq: Every day | ORAL | Status: DC
  Administered 2013-03-24 – 2013-03-28 (×5): 100 mg via ORAL
  Filled 2013-03-24 (×5): qty 1

## 2013-03-24 MED ORDER — SODIUM CHLORIDE 0.9 % IV BOLUS (SEPSIS)
500.0000 mL | Freq: Once | INTRAVENOUS | Status: AC
  Administered 2013-03-24: 500 mL via INTRAVENOUS

## 2013-03-24 MED ORDER — FOLIC ACID 1 MG PO TABS
1.0000 mg | ORAL_TABLET | Freq: Every day | ORAL | Status: DC
  Administered 2013-03-24 – 2013-03-28 (×5): 1 mg via ORAL
  Filled 2013-03-24 (×5): qty 1

## 2013-03-24 MED ORDER — LORAZEPAM 2 MG/ML IJ SOLN
1.0000 mg | Freq: Four times a day (QID) | INTRAMUSCULAR | Status: AC | PRN
  Administered 2013-03-27 (×2): 1 mg via INTRAVENOUS
  Filled 2013-03-24 (×2): qty 1

## 2013-03-24 MED ORDER — SODIUM CHLORIDE 0.9 % IJ SOLN
3.0000 mL | Freq: Two times a day (BID) | INTRAMUSCULAR | Status: DC
  Administered 2013-03-24 – 2013-03-27 (×7): 3 mL via INTRAVENOUS

## 2013-03-24 MED ORDER — THIAMINE HCL 100 MG/ML IJ SOLN
100.0000 mg | Freq: Every day | INTRAMUSCULAR | Status: DC
  Filled 2013-03-24 (×2): qty 1

## 2013-03-24 MED ORDER — ENSURE COMPLETE PO LIQD
237.0000 mL | Freq: Two times a day (BID) | ORAL | Status: DC
  Administered 2013-03-25 – 2013-03-28 (×5): 237 mL via ORAL

## 2013-03-24 MED ORDER — PANTOPRAZOLE SODIUM 40 MG PO TBEC
40.0000 mg | DELAYED_RELEASE_TABLET | Freq: Every day | ORAL | Status: DC
  Administered 2013-03-24 – 2013-03-28 (×5): 40 mg via ORAL
  Filled 2013-03-24 (×5): qty 1

## 2013-03-24 MED ORDER — ONDANSETRON HCL 4 MG PO TABS: 4.0000 mg | ORAL_TABLET | Freq: Four times a day (QID) | ORAL | Status: DC | PRN

## 2013-03-24 MED ORDER — ONDANSETRON HCL 4 MG/2ML IJ SOLN: 4.0000 mg | Freq: Four times a day (QID) | INTRAMUSCULAR | Status: DC | PRN

## 2013-03-24 MED ORDER — OXYCODONE HCL 5 MG PO TABS
10.0000 mg | ORAL_TABLET | Freq: Three times a day (TID) | ORAL | Status: DC | PRN
  Administered 2013-03-24 – 2013-03-25 (×3): 10 mg via ORAL
  Filled 2013-03-24 (×2): qty 2
  Filled 2013-03-24 (×2): qty 1

## 2013-03-24 MED ORDER — IOHEXOL 300 MG/ML  SOLN
100.0000 mL | Freq: Once | INTRAMUSCULAR | Status: AC | PRN
  Administered 2013-03-24: 100 mL via INTRAVENOUS

## 2013-03-24 MED ORDER — LORAZEPAM 1 MG PO TABS
1.0000 mg | ORAL_TABLET | Freq: Four times a day (QID) | ORAL | Status: AC | PRN
  Administered 2013-03-24 – 2013-03-26 (×7): 1 mg via ORAL
  Filled 2013-03-24 (×5): qty 2
  Filled 2013-03-24: qty 1
  Filled 2013-03-24: qty 2

## 2013-03-24 NOTE — Care Management Note (Addendum)
    Page 1 of 1   03/30/2013     12:35:29 PM   CARE MANAGEMENT NOTE 03/30/2013  Patient:  LAMOND, GLANTZ   Account Number:  [id number]  Date Initiated:  03/24/2013  Documentation initiated by:  Junius Creamer  Subjective/Objective Assessment:   adm w sepsis     Action/Plan:   lives alone.   Anticipated DC Date:  03/29/2013   Anticipated DC Plan:  HOME W HOME HEALTH SERVICES      DC Planning Services  CM consult      Choice offered to / List presented to:             Status of service:  Completed, signed off Medicare Important Message given?   (If response is "NO", the following Medicare IM given date fields will be blank) Date Medicare IM given:   Date Additional Medicare IM given:    Discharge Disposition:  HOME/SELF CARE  Per UR Regulation:  Reviewed for med. necessity/level of care/duration of stay  If discussed at Long Length of Stay Meetings, dates discussed:    Comments:  03/27/13 11:59 Letha Cape RN, BSN [phone] patient lives alone per his mother, Gibril Mastro.  She states he has l sisters that can help him and he is no longer married.  He has stayed with her in the past but he is renting a place right now.

## 2013-03-24 NOTE — Progress Notes (Signed)
Pt's BP trending low with SBP 80s. Donnamarie Poag NP notified and new orders carried out.

## 2013-03-24 NOTE — Progress Notes (Signed)
Nutrition Brief Note  Patient identified on the Malnutrition Screening Tool (MST) Report  Body mass index is 25.86 kg/(m^2). Patient meets criteria for overweight based on current BMI.   Current diet order is Mechanical soft, patient is consuming approximately 50-100% of meals at this time. Labs and medications reviewed.   Pt reports recent wt stability.  He reports he weighed 220 ls in 2011.  Pt reports he gets enough to eat, however does not always eat healthy "at home alone."  Pt requests Ensure Complete which will be ordered for pt.  No nutrition interventions warranted at this time. If nutrition issues arise, please consult RD.   Loyce Dys, MS RD LDN Clinical Inpatient Dietitian Pager: [phone] Weekend/After hours pager: [phone]

## 2013-03-24 NOTE — Consult Note (Signed)
Reason for Consult: Anemia cirrhosis Referring Physician: Hospital team  Alex Henry is an 62 y.o. male.  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 Bacteremia 02/2010    GBS bacteremia - tx with Abx x 28 days  . Septic arthritis     sternoclavicular  . Hepatitis C DX: 03/2010    VL (03/2010) 5,940,000  . Depression   . Anxiety   . History of Clostridium difficile infection   . Discitis   . Epidural abscess     H/O   . Elevated blood pressure   . Shoulder pain, bilateral   . Anemia   . Hepatic cirrhosis   . Hepatic encephalopathy 03/2010  . Allergic rhinitis     chronic  . Drug-induced constipation   . Osteomyelitis 05/2010, 07/2010    Diskitis and osteomyelitis at C6-C7   . Thrombocytopenia   . Alcohol abuse   . Atypical chest pain     Se[facility] (11/2010) - thought to be 2/2 GERD. Lexiscan Stress test (12/05/10) - at [facility]. No ischemia seen, normal EF of 65%    History reviewed. No pertinent past surgical history.  Family History  Problem Relation Age of Onset  . Heart disease Father   . Heart attack Father   . Cancer Maternal Aunt 72    colon cancer  . Arthritis Mother   . Arthritis Sister   . Anxiety disorder Sister   . Arthritis Sister   . Anxiety disorder Sister   . Cirrhosis Brother     and hepatitis c  . Cirrhosis Brother     and hepatitis c    Social History:  reports that he quit smoking about 9 years ago. His smokeless  tobacco use includes Snuff. He reports that he does not drink alcohol or use illicit drugs.  Allergies:  Allergies  Allergen Reactions  . Ceftriaxone Sodium     REACTION: RASH, severe  . Codeine Rash    Medications: I have reviewed the patient's current medications.  Results for orders placed during the hospital encounter of 03/23/13 (from the past 48 hour(s))  CBC     Status: Abnormal   Collection Time    03/23/13  8:10 PM      Result Value Range   WBC 5.9  4.0 - 10.5 K/uL   RBC 2.91 (*) 4.22 - 5.81 MIL/uL   Hemoglobin 7.5 (*) 13.0 - 17.0 g/dL   HCT 16.1 (*) 09.6 - 04.5 %   MCV 83.2  78.0 - 100.0 fL   MCH 25.8 (*) 26.0 - 34.0 pg   MCHC 31.0  30.0 - 36.0 g/dL   RDW 40.9 (*) 81.1 - 91.4 %   Platelets 49 (*) 150 - 400 K/uL   Comment: PSYCHOLOGY  BASIC METABOLIC PANEL     Status: Abnormal   Collection Time    03/23/13  8:10 PM      Result Value Range   Sodium  134 (*) 135 - 145 mEq/L   Potassium 3.2 (*) 3.5 - 5.1 mEq/L   Chloride 104  96 - 112 mEq/L   CO2 25  19 - 32 mEq/L   Glucose, Bld 94  70 - 99 mg/dL   BUN 14  6 - 23 mg/dL   Creatinine, Ser 1.61  0.50 - 1.35 mg/dL   Calcium 7.6 (*) 8.4 - 10.5 mg/dL   GFR calc non Af Amer 72 (*) >90 mL/min   GFR calc Af Amer 84 (*) >90 mL/min   Comment:            The eGFR has been calculated     using the CKD EPI equation.     This calculation has not been     validated in all clinical     situations.     eGFR's persistently     <90 mL/min signify     possible Chronic Kidney Disease.  PROCALCITONIN     Status: None   Collection Time    03/23/13  8:10 PM      Result Value Range   Procalcitonin 0.23     Comment:            Interpretation:     PCT (Procalcitonin) <= 0.5 ng/mL:     Systemic infection (sepsis) is not likely.     Local bacterial infection is possible.     (NOTE)             ICU PCT Algorithm               Non ICU PCT Algorithm        ----------------------------     ------------------------------              PCT < 0.25 ng/mL                 PCT < 0.1 ng/mL         Stopping of antibiotics            Stopping of antibiotics           strongly encouraged.               strongly encouraged.        ----------------------------     ------------------------------           PCT level decrease by               PCT < 0.25 ng/mL           >= 80% from peak PCT           OR PCT 0.25 - 0.5 ng/mL          Stopping of antibiotics                                                 encouraged.         Stopping of antibiotics               encouraged.        ----------------------------     ------------------------------           PCT level decrease by              PCT >= 0.25 ng/mL           < 80% from  peak PCT            AND PCT >= 0.5 ng/mL            Continuing antibiotics                                                  encouraged.           Continuing antibiotics                encouraged.        ----------------------------     ------------------------------         PCT level increase compared          PCT > 0.5 ng/mL             with peak PCT AND              PCT >= 0.5 ng/mL             Escalation of antibiotics                                              strongly encouraged.          Escalation of antibiotics            strongly encouraged.  ETHANOL     Status: None   Collection Time    03/23/13  8:10 PM      Result Value Range   Alcohol, Ethyl (B) <11  0 - 11 mg/dL   Comment:            LOWEST DETECTABLE LIMIT FOR     SERUM ALCOHOL IS 11 mg/dL     FOR MEDICAL PURPOSES ONLY  POCT I-STAT TROPONIN I     Status: None   Collection Time    03/23/13  8:25 PM      Result Value Range   Troponin i, poc 0.00  0.00 - 0.08 ng/mL   Comment 3            Comment: Due to the release kinetics of cTnI,     a negative result within the first hours     of the onset of symptoms does not rule out     myocardial infarction with certainty.     If myocardial infarction is still suspected,     repeat the test at  appropriate intervals.  CG4 I-STAT (LACTIC ACID)     Status: None   Collection Time    03/23/13  8:26 PM      Result Value Range   Lactic Acid, Venous 1.68  0.5 - 2.2 mmol/L  URINALYSIS, ROUTINE W REFLEX MICROSCOPIC     Status: Abnormal   Collection Time    03/23/13  9:19 PM      Result Value Range   Color, Urine AMBER (*) YELLOW   Comment: BIOCHEMICALS MAY BE AFFECTED BY COLOR   APPearance CLEAR  CLEAR   Specific Gravity, Urine 1.020  1.005 - 1.030   pH 5.5  5.0 - 8.0   Glucose, UA NEGATIVE  NEGATIVE mg/dL   Hgb urine dipstick NEGATIVE  NEGATIVE   Bilirubin Urine SMALL (*) NEGATIVE   Ketones, ur NEGATIVE  NEGATIVE mg/dL  Protein, ur NEGATIVE  NEGATIVE mg/dL   Urobilinogen, UA 1.0  0.0 - 1.0 mg/dL   Nitrite NEGATIVE  NEGATIVE   Leukocytes, UA NEGATIVE  NEGATIVE   Comment: MICROSCOPIC NOT DONE ON URINES WITH NEGATIVE PROTEIN, BLOOD, LEUKOCYTES, NITRITE, OR GLUCOSE <1000 mg/dL.  URINE RAPID DRUG SCREEN (HOSP PERFORMED)     Status: Abnormal   Collection Time    03/23/13  9:19 PM      Result Value Range   Opiates NONE DETECTED  NONE DETECTED   Cocaine NONE DETECTED  NONE DETECTED   Benzodiazepines POSITIVE (*) NONE DETECTED   Amphetamines POSITIVE (*) NONE DETECTED   Tetrahydrocannabinol NONE DETECTED  NONE DETECTED   Barbiturates NONE DETECTED  NONE DETECTED   Comment:            DRUG SCREEN FOR MEDICAL PURPOSES     ONLY.  IF CONFIRMATION IS NEEDED     FOR ANY PURPOSE, NOTIFY LAB     WITHIN 5 DAYS.                LOWEST DETECTABLE LIMITS     FOR URINE DRUG SCREEN     Drug Class       Cutoff (ng/mL)     Amphetamine      1000     Barbiturate      200     Benzodiazepine   200     Tricyclics       300     Opiates          300     Cocaine          300     THC              50  TYPE AND SCREEN     Status: None   Collection Time    03/23/13 10:10 PM      Result Value Range   ABO/RH(D) A NEG     Antibody Screen NEG     Sample Expiration 03/26/2013     Unit Number  [id number]     Blood Component Type RED CELLS,LR     Unit division 00     Status of Unit ISSUED     Transfusion Status OK TO TRANSFUSE     Crossmatch Result Compatible     Unit Number [id number]     Blood Component Type RED CELLS,LR     Unit division 00     Status of Unit ISSUED     Transfusion Status OK TO TRANSFUSE     Crossmatch Result Compatible    MRSA PCR SCREENING     Status: None   Collection Time    03/24/13 12:51 AM      Result Value Range   MRSA by PCR NEGATIVE  NEGATIVE   Comment:            The GeneXpert MRSA Assay (FDA     approved for NASAL specimens     only), is one component of a     comprehensive MRSA colonization     surveillance program. It is not     intended to diagnose MRSA     infection nor to guide or     monitor treatment for     MRSA infections.  CBC     Status: Abnormal   Collection Time    03/24/13  1:19 AM      Result Value Range   WBC 6.4  4.0 - 10.5 K/uL  RBC 2.75 (*) 4.22 - 5.81 MIL/uL   Hemoglobin 7.2 (*) 13.0 - 17.0 g/dL   HCT 16.1 (*) 09.6 - 04.5 %   MCV 83.6  78.0 - 100.0 fL   MCH 26.2  26.0 - 34.0 pg   MCHC 31.3  30.0 - 36.0 g/dL   RDW 40.9 (*) 81.1 - 91.4 %   Platelets 45 (*) 150 - 400 K/uL   Comment: CONSISTENT WITH PREVIOUS RESULT  CREATININE, SERUM     Status: Abnormal   Collection Time    03/24/13  1:19 AM      Result Value Range   Creatinine, Ser 1.15  0.50 - 1.35 mg/dL   GFR calc non Af Amer 66 (*) >90 mL/min   GFR calc Af Amer 77 (*) >90 mL/min   Comment:            The eGFR has been calculated     using the CKD EPI equation.     This calculation has not been     validated in all clinical     situations.     eGFR's persistently     <90 mL/min signify     possible Chronic Kidney Disease.  TSH     Status: None   Collection Time    03/24/13  1:19 AM      Result Value Range   TSH 0.464  0.350 - 4.500 uIU/mL  VITAMIN B12     Status: None   Collection Time    03/24/13  1:19 AM      Result Value Range    Vitamin B-12 768  211 - 911 pg/mL  FOLATE     Status: None   Collection Time    03/24/13  1:19 AM      Result Value Range   Folate 6.6     Comment: (NOTE)     Reference Ranges            Deficient:       0.4 - 3.3 ng/mL            Indeterminate:   3.4 - 5.4 ng/mL            Normal:              > 5.4 ng/mL  IRON AND TIBC     Status: Abnormal   Collection Time    03/24/13  1:19 AM      Result Value Range   Iron 25 (*) 42 - 135 ug/dL   TIBC 782  956 - 213 ug/dL   Saturation Ratios 10 (*) 20 - 55 %   UIBC 222  125 - 400 ug/dL  FERRITIN     Status: None   Collection Time    03/24/13  1:19 AM      Result Value Range   Ferritin 47  22 - 322 ng/mL  RETICULOCYTES     Status: Abnormal   Collection Time    03/24/13  1:19 AM      Result Value Range   Retic Ct Pct 2.1  0.4 - 3.1 %   RBC. 2.75 (*) 4.22 - 5.81 MIL/uL   Retic Count, Manual 57.8  19.0 - 186.0 K/uL  PREPARE RBC (CROSSMATCH)     Status: None   Collection Time    03/24/13  1:23 AM      Result Value Range   Order Confirmation ORDER PROCESSED BY BLOOD BANK    GLUCOSE, CAPILLARY  Status: None   Collection Time    03/24/13  9:02 AM      Result Value Range   Glucose-Capillary 88  70 - 99 mg/dL  HEPATIC FUNCTION PANEL     Status: Abnormal   Collection Time    03/24/13  9:40 AM      Result Value Range   Total Protein 5.8 (*) 6.0 - 8.3 g/dL   Albumin 2.1 (*) 3.5 - 5.2 g/dL   AST 37  0 - 37 U/L   ALT 31  0 - 53 U/L   Alkaline Phosphatase 25 (*) 39 - 117 U/L   Total Bilirubin 2.5 (*) 0.3 - 1.2 mg/dL   Bilirubin, Direct 1.0 (*) 0.0 - 0.3 mg/dL   Indirect Bilirubin 1.5 (*) 0.3 - 0.9 mg/dL  CBC     Status: Abnormal   Collection Time    03/24/13  9:40 AM      Result Value Range   WBC 5.3  4.0 - 10.5 K/uL   RBC 3.14 (*) 4.22 - 5.81 MIL/uL   Hemoglobin 8.3 (*) 13.0 - 17.0 g/dL   HCT 16.1 (*) 09.6 - 04.5 %   MCV 84.1  78.0 - 100.0 fL   MCH 26.4  26.0 - 34.0 pg   MCHC 31.4  30.0 - 36.0 g/dL   RDW 40.9 (*) 81.1 - 91.4 %    Platelets 41 (*) 150 - 400 K/uL   Comment: CONSISTENT WITH PREVIOUS RESULT  APTT     Status: Abnormal   Collection Time    03/24/13  9:40 AM      Result Value Range   aPTT 41 (*) 24 - 37 seconds   Comment:            IF BASELINE aPTT IS ELEVATED,     SUGGEST PATIENT RISK ASSESSMENT     BE USED TO DETERMINE APPROPRIATE     ANTICOAGULANT THERAPY.  PROTIME-INR     Status: Abnormal   Collection Time    03/24/13  9:40 AM      Result Value Range   Prothrombin Time 20.3 (*) 11.6 - 15.2 seconds   INR 1.81 (*) 0.00 - 1.49    Dg Chest Port 1 View  (if Code Sepsis Called)  03/23/2013   *RADIOLOGY REPORT*  Clinical Data: Fever  PORTABLE CHEST - 1 VIEW  Comparison: 06/24/2012  Findings: Mild cardiomegaly stable.  No effusion.  Mild perihilar and infrahilar interstitial prominence possibly early     edema. Atheromatous aortic arch.  Old healed left clavicle fracture.  IMPRESSION:  Mild perihilar edema/infiltrates.   Original Report Authenticated By: D. Andria Rhein, MD    ROS negative except above he is doing better than when he was admitted he says Blood pressure 99/59, pulse 76, temperature 97.6 F (36.4 C), temperature source Oral, resp. rate 19, height 6' (1.829 m), weight 86.5 kg (190 lb 11.2 oz), SpO2 97.00%. Physical Exam vital signs stable afebrile no acute distress abdomen he says is unchanged in girth nontender minimal pedal edema labs reviewed as was previous colonoscopy endoscopy and pathology  Assessment/Plan: Multiple medical problems including anemia not iron deficient and cirrhosis Plan: Will begin workup with an abdominal pelvic CT scan to rule out anything significant and he probably is due for another colonoscopy however based on multiple medical problem might not proceed at this time and consider endoscopy to rule out varices will await CT as above and will need to be cautioned about alcohol too much Tylenol  and no aspirin or nonsteroidals Kymberlee Viger E 03/24/2013, 12:57 PM

## 2013-03-24 NOTE — Progress Notes (Addendum)
[facility] Progress Note [place] TEAM 1 - Stepdown/ICU TEAM   Alex Henry:[id number] DOB: Jan 11, 1951 DOA: 03/23/2013 PCP: No primary provider on file.  Brief narrative: 62 year old male who is an extremely poor historian. Previously hospitalized in 2011 with septic arthritis epidural abscesses in the setting of known hep C. He also had discitis and nausea malaise. He has a history of alcohol tobacco abuse. He presented to the ER because of fever and generalized malaise. At that time he was found to have a MAXIMUM TEMPERATURE 103. Apparently he had been having some yellow productive coughing but was rare and he attributed this to the pollen. He denied to the admitting physician symptoms of headache sore throat abdominal pain nausea or vomiting but endorses chronic arthralgias. In the ER he had no leukocytosis urinalysis was negative and his chest x-ray was negative. His hemoglobin was low at 7.5 and dating back to 2011 2012 his hemoglobin average was around 12. He denied dark or bloody stools. His Procalcitonin was within normal limits. In the ER he was hypotensive but this did respond to IV fluids and his blood pressure was hanging around 90-100 systolic. Blood and urine cultures were obtained by the ER.  Assessment/Plan: Active Problems:   Sepsis/Hypotension -Source unclear but consideration of CAP -low BP seems mediated by hypotension-improved after IVF -follow up on cx's and cont empiric anbx's    Dehydration/Fever -after admit pt admitted to "a day" of protracted N/V but denied bloody emesis or stool -as above -check OVS    HEPATIC CIRRHOSIS/Hepatitis C carrier -LFT's show slightly worsened TB from baseline of 1.4 in 2012- now 2.5 -check ammonia level -pt has appt scheduled with GI/[facility] in [place]- unclear if established pt or new    Chronic disease anemia-progressive -Hgb ~ 1-12 in 2012 -now down to 7.5-follow labs/no signs of active bleeding -GI consult -??  Varices -check occult blood in stool    Thrombocytopenia, acquired and Abnormal prothrombin time (PT)  due to cirrhosis -baseline in 2012 was 88,000 and now is 41,000 -dc SQ Heparin due to low platelets and ? of occult GIB    DEPRESSION -was on Xanax pre admit (see below)    Pain in joint, multiple sites (chronic) -on oxycodone TID pre admit-resume to prevent withdrawal    Personal history of alcoholism -pt denies daily ETOH and admitted to use this past WE and usually drinks periodically but then says last drank because had "shakes" so suspect drinks more than admits to so will begin CIWA and dc home Xanax for now   DVT prophylaxis: Subcutaneous heparin DC'd in favor of SCD Code Status: Full Family Communication: Patient only Disposition Plan: Stepdown Isolation: Contact isolation for MRSA PCR positive status Nutritional Status: Likely inadequate given suspected ongoing alcohol abuse and known progressive cirrhosis  Consultants: Gastroenterology  Procedures: None  Antibiotics: Zosyn 6/9 >>> Vancomycin 6/9 >>>  HPI/Subjective: Patient alert and without any specific complaints. Poor historian but was able to tell us he has seen Dr. Braulio Conte as well as Dr. Allena Katz and Dr. Lendon Colonel but calls them all as "internal medicine doctors". Did describe episodic nausea and vomiting without any obvious melena or hematemesis prior to admission. Currently denies abdominal pain, chest pain or shortness of breath.   Objective: Blood pressure 95/55, pulse 80, temperature 97.8 F (36.6 C), temperature source Oral, resp. rate 16, height 6' (1.829 m), weight 86.5 kg (190 lb 11.2 oz), SpO2 98.00%.  Intake/Output Summary (Last 24 hours) at 03/24/13 1154 Last data  filed at 03/24/13 0754  Gross per 24 hour  Intake 3781.33 ml  Output      0 ml  Net 3781.33 ml     Exam: General: No acute respiratory distress Lungs: Clear to auscultation bilaterally without wheezes or crackles, RA,  97% Cardiovascular: Regular rate and rhythm without murmur gallop or rub normal S1 and S2, no peripheral edema or JVD, IVF @ 125 Abdomen: Nontender, slightly distended, soft, bowel sounds positive, no rebound, no ascites, no appreciable mass Musculoskeletal: No significant cyanosis, clubbing of bilateral lower extremities Neurological: Alert and oriented x name and place but having difficulty recalling basic information regarding personal health history i.e. seems to have short-term memory deficits, moves all extremities x 4 without focal sensory motor neurological deficits, CN 2-12 grossly intact  Scheduled Meds: Scheduled Meds: . folic acid  1 mg Oral Daily  . heparin  5,000 Units Subcutaneous Q8H  . multivitamin with minerals  1 tablet Oral Daily  . pantoprazole  40 mg Oral Daily  . piperacillin-tazobactam (ZOSYN)  IV  3.375 g Intravenous Q8H  . sodium chloride  3 mL Intravenous Q12H  . thiamine  100 mg Oral Daily   Or  . thiamine  100 mg Intravenous Daily  . vancomycin  1,000 mg Intravenous Q8H   Continuous Infusions: . sodium chloride 125 mL/hr at 03/24/13 0539    Data Reviewed: Basic Metabolic Panel:  Recent Labs Lab 03/23/13 2010 03/24/13 0119  NA 134*  --   K 3.2*  --   CL 104  --   CO2 25  --   GLUCOSE 94  --   BUN 14  --   CREATININE 1.07 1.15  CALCIUM 7.6*  --    Liver Function Tests:  Recent Labs Lab 03/24/13 0940  AST 37  ALT 31  ALKPHOS 25*  BILITOT 2.5*  PROT 5.8*  ALBUMIN 2.1*   No results found for this basename: LIPASE, AMYLASE,  in the last 168 hours No results found for this basename: AMMONIA,  in the last 168 hours CBC:  Recent Labs Lab 03/23/13 2010 03/24/13 0119 03/24/13 0940  WBC 5.9 6.4 5.3  HGB 7.5* 7.2* 8.3*  HCT 24.2* 23.0* 26.4*  MCV 83.2 83.6 84.1  PLT 49* 45* 41*   Cardiac Enzymes: No results found for this basename: CKTOTAL, CKMB, CKMBINDEX, TROPONINI,  in the last 168 hours BNP (last 3 results) No results found for  this basename: PROBNP,  in the last 8760 hours CBG:  Recent Labs Lab 03/24/13 0902  GLUCAP 88    Recent Results (from the past 240 hour(s))  MRSA PCR SCREENING     Status: None   Collection Time    03/24/13 12:51 AM      Result Value Range Status   MRSA by PCR NEGATIVE  NEGATIVE Final   Comment:            The GeneXpert MRSA Assay (FDA     approved for NASAL specimens     only), is one component of a     comprehensive MRSA colonization     surveillance program. It is not     intended to diagnose MRSA     infection nor to guide or     monitor treatment for     MRSA infections.     Studies:  Recent x-ray studies have been reviewed in detail by the Attending Physician  Scheduled Meds:  Reviewed in detail by the Attending Physician Patient seen and examined .  I have assessed and evaluated underlying  issues and agrees with the plan.   Junious Silk, ANP [facility] Office  [phone] Pager [phone]  **If unable to reach the above provider after paging please contact the Flow Manager @ [phone]  On-Call/Text Page:      [email or web address]      password [email or web address]  If 7PM-7AM, please contact night-coverage [email or web address] Password [email or web address] 03/24/2013, 11:54 AM   LOS: 1 day

## 2013-03-25 LAB — COMPREHENSIVE METABOLIC PANEL
ALT: 29 U/L (ref 0–53)
AST: 38 U/L — ABNORMAL HIGH (ref 0–37)
Albumin: 2.1 g/dL — ABNORMAL LOW (ref 3.5–5.2)
Alkaline Phosphatase: 25 U/L — ABNORMAL LOW (ref 39–117)
Calcium: 7.4 mg/dL — ABNORMAL LOW (ref 8.4–10.5)
GFR calc Af Amer: >90 mL/min (ref >90)
Potassium: 3 mEq/L — ABNORMAL LOW (ref 3.5–5.1)
Sodium: 133 mEq/L — ABNORMAL LOW (ref 135–145)
Total Protein: 5.7 g/dL — ABNORMAL LOW (ref 6.0–8.3)

## 2013-03-25 LAB — TYPE AND SCREEN
Antibody Screen: NEGATIVE
Unit division: 0
Unit division: 0

## 2013-03-25 LAB — CBC
HCT: 26.1 % — ABNORMAL LOW (ref 39.0–52.0)
Hemoglobin: 8.3 g/dL — ABNORMAL LOW (ref 13.0–17.0)
MCH: 26.4 pg (ref 26.0–34.0)
MCHC: 31.8 g/dL (ref 30.0–36.0)
MCV: 83.1 fL (ref 78.0–100.0)
RDW: 16.6 % — ABNORMAL HIGH (ref 11.5–15.5)

## 2013-03-25 LAB — URINE CULTURE

## 2013-03-25 MED ORDER — OXYCODONE HCL 5 MG PO TABS
5.0000 mg | ORAL_TABLET | Freq: Three times a day (TID) | ORAL | Status: DC | PRN
  Administered 2013-03-25 – 2013-03-28 (×6): 5 mg via ORAL
  Filled 2013-03-25 (×7): qty 1

## 2013-03-25 MED ORDER — POTASSIUM CHLORIDE CRYS ER 20 MEQ PO TBCR
40.0000 meq | EXTENDED_RELEASE_TABLET | Freq: Two times a day (BID) | ORAL | Status: DC
  Administered 2013-03-25 – 2013-03-27 (×4): 40 meq via ORAL
  Filled 2013-03-25 (×5): qty 2

## 2013-03-25 MED ORDER — POTASSIUM CHLORIDE CRYS ER 20 MEQ PO TBCR
20.0000 meq | EXTENDED_RELEASE_TABLET | Freq: Two times a day (BID) | ORAL | Status: DC
Start: 2013-03-25 — End: 2013-03-25
  Administered 2013-03-25: 20 meq via ORAL
  Filled 2013-03-25: qty 1

## 2013-03-25 MED ORDER — LACTULOSE 10 GM/15ML PO SOLN
20.0000 g | Freq: Three times a day (TID) | ORAL | Status: DC
  Administered 2013-03-25 – 2013-03-27 (×5): 20 g via ORAL
  Filled 2013-03-25 (×9): qty 30

## 2013-03-25 NOTE — Progress Notes (Addendum)
[facility] Progress Note [facility] TEAM 1 - Stepdown/ICU TEAM   CHANCELER PULLIN ZOX:[id number] DOB: 1951/03/20 DOA: 03/23/2013 PCP: No primary provider on file.  Brief narrative: 62 year old male who is an extremely poor historian. Previously hospitalized in 2011 with septic arthritis and epidural abscesses - known hep C. He also had discitis and osteomyelitis. He has a history of alcohol and tobacco abuse. He presented to the ER because of fever and generalized malaise. At that time he was found to have a MAXIMUM TEMPERATURE 103. Apparently he had been having some yellow productive coughing which was rare and he attributed this to the pollen. He denied to the admitting physician symptoms of headache, sore throat, abdominal pain, nausea or vomiting but endorsed chronic arthralgias. In the ER he had no leukocytosis, urinalysis was negative and his chest x-ray was negative. His hemoglobin was low at 7.5 (in 2012 his hemoglobin average was around 12). He denied dark or bloody stools. His Procalcitonin was within normal limits. In the ER he was hypotensive but this did respond to IV fluids and his blood pressure was hanging around 90-100 systolic. Blood and urine cultures were obtained by the ER.  Assessment/Plan:  Hypotension/Dehydration  -No definitive infectious source so suspect hypotension due to hypovolemia -improved after IVF -dc empiric anbx's and follow trend   Fever -after admit pt admitted to "a day" of protracted N/V but denied bloody emesis or stool -tolerating diet  -check OVS  HEPATIC CIRRHOSIS / Hepatitis C / massive splenomegaly  -LFT's show slightly worsened TB from baseline of 1.4 in 2012 - now 2.5 but decreased after hydration -pt has appt scheduled with GI/[facility] in [place]- unclear if established pt or new  -CT abdomen revealed massive spleen and esophageal varices - GI consulted and planning EGD in AM  Mild Hepatic encephalopathy -Ammonia level 90 -Begin  Lactulose and follow ammonia - mental status is quite slow   Mild hypokalemia -replete BID while on Lactulose -was also on Lasix pre admit  Chronic disease anemia - progressive -Hgb ~ 11-12 in 2012 - 7.5 at admit - today 8.3 -no signs of active bleeding -GI following -for endo in AM  Thrombocytopenia, acquired and Abnormal prothrombin time (PT)  due to cirrhosis/spelnomegaly -baseline in 2012 was 88,000 and now is 41,000 and have been stable - suspect is new baseline -dc SQ Heparin due to low platelets and ? of occult GIB  DEPRESSION -was on Xanax pre admit (see below)  Pain in joint, multiple sites (chronic) -on oxycodone TID pre admit - resume at low dose to prevent withdrawal  Personal history of alcoholism -pt denies daily ETOH and admitted to use this past WE and usually drinks periodically but then says last drank because had "shakes" so suspect drinks more than admits to so will begin CIWA and dc home Xanax for now  DVT prophylaxis: Subcutaneous heparin DC'd in favor of SCD Code Status: Full Family Communication: Patient only Disposition Plan: Transfer to Telemetry Isolation: Contact isolation for MRSA PCR positive status  Consultants: Gastroenterology  Procedures: None  Antibiotics: Zosyn 6/9 >>> 6/11 Vancomycin 6/9 >>> 6/11  HPI/Subjective: Patient alert, sitting on side of bed. No complaints except doesn't like food.  Is somewhat slow to answer questions.  Stares off blankly when not engaged directly.   Objective: Blood pressure 112/72, pulse 71, temperature 97.7 F (36.5 C), temperature source Oral, resp. rate 19, height 6' (1.829 m), weight 62.619 kg (138 lb 0.8 oz), SpO2 100.00%.  Intake/Output Summary (Last  24 hours) at 03/25/13 1117 Last data filed at 03/25/13 1000  Gross per 24 hour  Intake   2366 ml  Output   1750 ml  Net    616 ml    Exam: General: No acute respiratory distress Lungs: Clear to auscultation bilaterally without wheezes or  crackles, RA, 97% Cardiovascular: Regular rate and rhythm without murmur gallop or rub normal S1 and S2, 2+ bilateral LE edema  Abdomen: Nontender, distended, soft, bowel sounds positive, no rebound, no appreciable mass Musculoskeletal: No significant cyanosis, clubbing of bilateral lower extremities Neurological: Alert and oriented x name and place but having difficulty recalling basic information regarding personal health history i.e. seems to have short-term memory deficits, moves all extremities x 4 without focal sensory motor neurological deficits, CN 2-12 grossly intact  Scheduled Meds: Scheduled Meds: . feeding supplement  237 mL Oral BID BM  . folic acid  1 mg Oral Daily  . lactulose  20 g Oral TID  . multivitamin with minerals  1 tablet Oral Daily  . pantoprazole  40 mg Oral Daily  . potassium chloride  20 mEq Oral BID  . sodium chloride  3 mL Intravenous Q12H  . thiamine  100 mg Oral Daily   Or  . thiamine  100 mg Intravenous Daily    Data Reviewed: Basic Metabolic Panel:  Recent Labs Lab 03/23/13 2010 03/24/13 0119 03/25/13 0410  NA 134*  --  133*  K 3.2*  --  3.0*  CL 104  --  102  CO2 25  --  26  GLUCOSE 94  --  101*  BUN 14  --  10  CREATININE 1.07 1.15 0.83  CALCIUM 7.6*  --  7.4*   Liver Function Tests:  Recent Labs Lab 03/24/13 0940 03/25/13 0410  AST 37 38*  ALT 31 29  ALKPHOS 25* 25*  BILITOT 2.5* 1.2  PROT 5.8* 5.7*  ALBUMIN 2.1* 2.1*    Recent Labs Lab 03/25/13 0410  AMMONIA 90*   CBC:  Recent Labs Lab 03/23/13 2010 03/24/13 0119 03/24/13 0940 03/25/13 0410  WBC 5.9 6.4 5.3 2.8*  HGB 7.5* 7.2* 8.3* 8.3*  HCT 24.2* 23.0* 26.4* 26.1*  MCV 83.2 83.6 84.1 83.1  PLT 49* 45* 41* 40*   CBG:  Recent Labs Lab 03/24/13 0902  GLUCAP 88    Recent Results (from the past 240 hour(s))  CULTURE, BLOOD (ROUTINE X 2)     Status: None   Collection Time    03/23/13  8:00 PM      Result Value Range Status   Specimen Description BLOOD  HAND LEFT   Final   Special Requests BOTTLES DRAWN AEROBIC ONLY 10CC   Final   Culture  Setup Time 03/24/2013 05:16   Final   Culture     Final   Value:        BLOOD CULTURE RECEIVED NO GROWTH TO DATE CULTURE WILL BE HELD FOR 5 DAYS BEFORE ISSUING A FINAL NEGATIVE REPORT   Report Status PENDING   Incomplete  CULTURE, BLOOD (ROUTINE X 2)     Status: None   Collection Time    03/23/13  8:10 PM      Result Value Range Status   Specimen Description BLOOD ARM RIGHT   Final   Special Requests BOTTLES DRAWN AEROBIC AND ANAEROBIC 10CC EA   Final   Culture  Setup Time 03/24/2013 05:16   Final   Culture     Final   Value:  BLOOD CULTURE RECEIVED NO GROWTH TO DATE CULTURE WILL BE HELD FOR 5 DAYS BEFORE ISSUING A FINAL NEGATIVE REPORT   Report Status PENDING   Incomplete  URINE CULTURE     Status: None   Collection Time    03/23/13  9:19 PM      Result Value Range Status   Specimen Description URINE, RANDOM   Final   Special Requests NONE   Final   Culture  Setup Time 03/23/2013 22:58   Final   Colony Count NO GROWTH   Final   Culture NO GROWTH   Final   Report Status 03/25/2013 FINAL   Final  MRSA PCR SCREENING     Status: None   Collection Time    03/24/13 12:51 AM      Result Value Range Status   MRSA by PCR NEGATIVE  NEGATIVE Final   Comment:            The GeneXpert MRSA Assay (FDA     approved for NASAL specimens     only), is one component of a     comprehensive MRSA colonization     surveillance program. It is not     intended to diagnose MRSA     infection nor to guide or     monitor treatment for     MRSA infections.    Studies:  Recent x-ray studies have been reviewed in detail by the Attending Physician  Junious Silk, ANP [facility] Office  [phone] Pager [phone]  **If unable to reach the above provider after paging please contact the Flow Manager @ [phone]  On-Call/Text Page:      [email or web address]      password [email or web address]  If 7PM-7AM, please  contact night-coverage [email or web address] Password [email or web address] 03/25/2013, 11:17 AM   LOS: 2 days   I have personally examined this patient and reviewed the entire database. I have reviewed the above note, made any necessary editorial changes, and agree with its content.  Lonia Blood, MD [facility]

## 2013-03-25 NOTE — Progress Notes (Signed)
Alex Henry 11:49 AM  Subjective: Patient without any new complaints and specifically no GI complaint and no signs of bleeding  Objective: Vital signs stable afebrile no acute distress labs stable CT pertinent for cirrhosis but no obvious mass  Assessment: Multiple medical problems including cirrhosis and chronic anemia  Plan: Will proceed tomorrow with an endoscopy to rule out varices and consider a colonoscopy at some point as well  Alex Henry E

## 2013-03-26 ENCOUNTER — Encounter (HOSPITAL_COMMUNITY): Payer: Self-pay

## 2013-03-26 ENCOUNTER — Encounter (HOSPITAL_COMMUNITY)
Admission: EM | Disposition: A | Discharge status: Home / Self Care | Payer: Self-pay | Source: Home / Self Care | Attending: Internal Medicine

## 2013-03-26 DIAGNOSIS — E860 Dehydration: Secondary | ICD-10-CM

## 2013-03-26 DIAGNOSIS — B182 Chronic viral hepatitis C: Secondary | ICD-10-CM

## 2013-03-26 DIAGNOSIS — I959 Hypotension, unspecified: Secondary | ICD-10-CM

## 2013-03-26 DIAGNOSIS — R7881 Bacteremia: Secondary | ICD-10-CM

## 2013-03-26 HISTORY — PX: ESOPHAGOGASTRODUODENOSCOPY: SHX5428

## 2013-03-26 LAB — COMPREHENSIVE METABOLIC PANEL
AST: 43 U/L — ABNORMAL HIGH (ref 0–37)
CO2: 25 mEq/L (ref 19–32)
Chloride: 105 mEq/L (ref 96–112)
Creatinine, Ser: 0.8 mg/dL (ref 0.50–1.35)
GFR calc Af Amer: >90 mL/min (ref >90)
GFR calc non Af Amer: >90 mL/min (ref >90)
Glucose, Bld: 93 mg/dL (ref 70–99)
Total Bilirubin: 0.8 mg/dL (ref 0.3–1.2)

## 2013-03-26 LAB — CBC
MCH: 26.5 pg (ref 26.0–34.0)
MCHC: 32.2 g/dL (ref 30.0–36.0)
Platelets: 44 10*3/uL — ABNORMAL LOW (ref 150–400)
RBC: 3.17 MIL/uL — ABNORMAL LOW (ref 4.22–5.81)

## 2013-03-26 LAB — AMMONIA: Ammonia: 65 umol/L — ABNORMAL HIGH (ref 11–60)

## 2013-03-26 SURGERY — EGD (ESOPHAGOGASTRODUODENOSCOPY)
Anesthesia: Moderate Sedation

## 2013-03-26 MED ORDER — SODIUM CHLORIDE 0.9 % IV SOLN
INTRAVENOUS | Status: DC
Start: 2013-03-26 — End: 2013-03-26
  Administered 2013-03-26: 08:00:00 via INTRAVENOUS

## 2013-03-26 MED ORDER — MORPHINE SULFATE 2 MG/ML IJ SOLN
1.0000 mg | INTRAMUSCULAR | Status: DC | PRN
  Administered 2013-03-27: 1 mg via INTRAVENOUS
  Filled 2013-03-26: qty 1

## 2013-03-26 MED ORDER — BUTAMBEN-TETRACAINE-BENZOCAINE 2-2-14 % EX AERO
INHALATION_SPRAY | CUTANEOUS | Status: DC | PRN
  Administered 2013-03-26: 2 via TOPICAL

## 2013-03-26 MED ORDER — MIDAZOLAM HCL 10 MG/2ML IJ SOLN
INTRAMUSCULAR | Status: DC | PRN
  Administered 2013-03-26 (×4): 2 mg via INTRAVENOUS
  Administered 2013-03-26: 1 mg via INTRAVENOUS

## 2013-03-26 MED ORDER — FENTANYL CITRATE 0.05 MG/ML IJ SOLN
INTRAMUSCULAR | Status: DC | PRN
  Administered 2013-03-26 (×2): 25 ug via INTRAVENOUS

## 2013-03-26 MED ORDER — FENTANYL CITRATE 0.05 MG/ML IJ SOLN
INTRAMUSCULAR | Status: AC
  Filled 2013-03-26: qty 2

## 2013-03-26 MED ORDER — MIDAZOLAM HCL 5 MG/ML IJ SOLN
INTRAMUSCULAR | Status: AC
  Filled 2013-03-26: qty 2

## 2013-03-26 NOTE — Progress Notes (Signed)
[facility] Progress Note [place] TEAM 1 - Stepdown/ICU TEAM   Alex Henry ZOX:[id number] DOB: 1950/12/04 DOA: 03/23/2013 PCP: No primary provider on file.  Brief narrative: 62 year old male who is an extremely poor historian. Previously hospitalized in 2011 with septic arthritis and epidural abscesses - known hep C. He also had discitis and osteomyelitis. He has a history of alcohol and tobacco abuse. He presented to the ER because of fever and generalized malaise. At that time he was found to have a MAXIMUM TEMPERATURE 103. Apparently he had been having some yellow productive coughing which was rare and he attributed this to the pollen. He denied to the admitting physician symptoms of headache, sore throat, abdominal pain, nausea or vomiting but endorsed chronic arthralgias. In the ER he had no leukocytosis, urinalysis was negative and his chest x-ray was negative. His hemoglobin was low at 7.5 (in 2012 his hemoglobin average was around 12). He denied dark or bloody stools. His Procalcitonin was within normal limits. In the ER he was hypotensive but this did respond to IV fluids and his blood pressure was hanging around 90-100 systolic. Blood and urine cultures were obtained by the ER.  Assessment/Plan:  Hypotension/Dehydration  -No definitive infectious source so suspect hypotension due to hypovolemia -improved after IVF -dc'D empiric anbx's (6/12) with plan to follow trend   Fever -after admit pt admitted to "a day" of protracted N/V but denied bloody emesis or stool -resolved  HEPATIC CIRRHOSIS / Hepatitis C / massive splenomegaly  -LFT's show slightly worsened TB from baseline of 1.4 in 2012 - now 2.5 but decreased after hydration -pt has appt scheduled with GI/[facility] in [place]- unclear if established pt or new  -CT abdomen revealed massive spleen and esophageal varices  - GI consulted/EGD pending for 6/12  Mild Hepatic encephalopathy -Ammonia level 90 -Lactulose  started 6/11 with subsequent decrease in ammonia - mental status is quite slow   Mild hypokalemia -stable -replete BID while on Lactulose -was also on Lasix pre admit  Chronic disease anemia - progressive -Hgb ~ 11-12 in 2012 - 7.5 at admit - today 8.3 -no signs of active bleeding -GI following/for endo 6/12 -Iron level 25 and B12 ~700- will need PO iron at discharge  Thrombocytopenia, acquired and Abnormal prothrombin time (PT)  due to cirrhosis/spelnomegaly -baseline in 2012 was 88,000 and now is 41,000 and have been stable - suspect is new baseline -SQ Heparin was dc'd due to low platelets and ? of occult GIB  DEPRESSION -was on Xanax pre admit (see below)  Pain in joint, multiple sites (chronic) -on oxycodone TID pre admit - resume at low dose to prevent withdrawal  Personal history of alcoholism -pt denies daily ETOH and admitted to use this past WE and usually drinks periodically but then says last drank because had "shakes" so suspect drinks more than admits to so will begin CIWA and dc home Xanax for now  DVT prophylaxis: Subcutaneous heparin DC'd in favor of SCD Code Status: Full Family Communication: Patient only Disposition Plan: Transfer to Floor Isolation: Contact isolation for MRSA PCR positive status  Consultants: Gastroenterology  Procedures: None  Antibiotics: Zosyn 6/9 >>> 6/11 Vancomycin 6/9 >>> 6/11  HPI/Subjective: Patient alert,endorses "wheezing' but lungs CTA.  Objective: Blood pressure 136/82, pulse 76, temperature 97.6 F (36.4 C), temperature source Oral, resp. rate 18, height 6' (1.829 m), weight 94.8 kg (208 lb 15.9 oz), SpO2 100.00%.  Intake/Output Summary (Last 24 hours) at 03/26/13 1243 Last data filed at 03/26/13  1207  Gross per 24 hour  Intake   1750 ml  Output    301 ml  Net   1449 ml    Exam: General: No acute respiratory distress Lungs: Clear to auscultation bilaterally without wheezes or crackles, RA,  97% Cardiovascular: Regular rate and rhythm without murmur gallop or rub normal S1 and S2, 2+ bilateral LE edema  Abdomen: Nontender, distended, soft, bowel sounds positive, no rebound, no appreciable mass Musculoskeletal: No significant cyanosis, clubbing of bilateral lower extremities Neurological: Alert and oriented x name and place,improved ST memory moves all extremities x 4 without focal sensory motor neurological deficits, CN 2-12 grossly intact  Scheduled Meds: Scheduled Meds: . feeding supplement  237 mL Oral BID BM  . folic acid  1 mg Oral Daily  . lactulose  20 g Oral TID  . multivitamin with minerals  1 tablet Oral Daily  . pantoprazole  40 mg Oral Daily  . potassium chloride  40 mEq Oral BID  . sodium chloride  3 mL Intravenous Q12H  . thiamine  100 mg Oral Daily    Data Reviewed: Basic Metabolic Panel:  Recent Labs Lab 03/23/13 2010 03/24/13 0119 03/25/13 0410 03/26/13 0352  NA 134*  --  133* 135  K 3.2*  --  3.0* 3.7  CL 104  --  102 105  CO2 25  --  26 25  GLUCOSE 94  --  101* 93  BUN 14  --  10 6  CREATININE 1.07 1.15 0.83 0.80  CALCIUM 7.6*  --  7.4* 7.8*   Liver Function Tests:  Recent Labs Lab 03/24/13 0940 03/25/13 0410 03/26/13 0352  AST 37 38* 43*  ALT 31 29 28   ALKPHOS 25* 25* 25*  BILITOT 2.5* 1.2 0.8  PROT 5.8* 5.7* 5.6*  ALBUMIN 2.1* 2.1* 2.0*    Recent Labs Lab 03/25/13 0410 03/26/13 0352  AMMONIA 90* 65*   CBC:  Recent Labs Lab 03/23/13 2010 03/24/13 0119 03/24/13 0940 03/25/13 0410 03/26/13 0352  WBC 5.9 6.4 5.3 2.8* 2.2*  HGB 7.5* 7.2* 8.3* 8.3* 8.4*  HCT 24.2* 23.0* 26.4* 26.1* 26.1*  MCV 83.2 83.6 84.1 83.1 82.3  PLT 49* 45* 41* 40* 44*   CBG:  Recent Labs Lab 03/24/13 0902  GLUCAP 88    Recent Results (from the past 240 hour(s))  CULTURE, BLOOD (ROUTINE X 2)     Status: None   Collection Time    03/23/13  8:00 PM      Result Value Range Status   Specimen Description BLOOD HAND LEFT   Final    Special Requests BOTTLES DRAWN AEROBIC ONLY 10CC   Final   Culture  Setup Time 03/24/2013 05:16   Final   Culture     Final   Value:        BLOOD CULTURE RECEIVED NO GROWTH TO DATE CULTURE WILL BE HELD FOR 5 DAYS BEFORE ISSUING A FINAL NEGATIVE REPORT   Report Status PENDING   Incomplete  CULTURE, BLOOD (ROUTINE X 2)     Status: None   Collection Time    03/23/13  8:10 PM      Result Value Range Status   Specimen Description BLOOD ARM RIGHT   Final   Special Requests BOTTLES DRAWN AEROBIC AND ANAEROBIC 10CC EA   Final   Culture  Setup Time 03/24/2013 05:16   Final   Culture     Final   Value:        BLOOD CULTURE  RECEIVED NO GROWTH TO DATE CULTURE WILL BE HELD FOR 5 DAYS BEFORE ISSUING A FINAL NEGATIVE REPORT   Report Status PENDING   Incomplete  URINE CULTURE     Status: None   Collection Time    03/23/13  9:19 PM      Result Value Range Status   Specimen Description URINE, RANDOM   Final   Special Requests NONE   Final   Culture  Setup Time 03/23/2013 22:58   Final   Colony Count NO GROWTH   Final   Culture NO GROWTH   Final   Report Status 03/25/2013 FINAL   Final  MRSA PCR SCREENING     Status: None   Collection Time    03/24/13 12:51 AM      Result Value Range Status   MRSA by PCR NEGATIVE  NEGATIVE Final   Comment:            The GeneXpert MRSA Assay (FDA     approved for NASAL specimens     only), is one component of a     comprehensive MRSA colonization     surveillance program. It is not     intended to diagnose MRSA     infection nor to guide or     monitor treatment for     MRSA infections.    Studies:  Recent x-ray studies have been reviewed in detail by the Attending Physician  Junious Silk, ANP [facility] Office  [phone] Pager [phone]  **If unable to reach the above provider after paging please contact the Flow Manager @ [phone]  On-Call/Text Page:      [email or web address]      password [email or web address]  If 7PM-7AM, please contact  night-coverage [email or web address] Password [facility] 03/26/2013, 12:43 PM   LOS: 3 days   I have examined the patient, reviewed the chart and modified the above note which I agree with.   Khilee Hendricksen,MD [phone] 03/26/2013, 1:42 PM

## 2013-03-26 NOTE — Progress Notes (Signed)
After our endoscopy conversation patient would like to go home and set up colonoscopy with his primary [place] gastroenterologist Dr. Ashok Norris and please make sure he gets a copy of the discharge dictation as well as Mr. Alex Henry his previous colonoscopy and pathology thank you

## 2013-03-26 NOTE — Op Note (Signed)
[facility] [address] [place], [address]   ENDOSCOPY PROCEDURE REPORT  PATIENT: [name], [name]  MR#: [id number] BIRTHDATE: 1950/10/22 , 61  yrs. old GENDER: Male  ENDOSCOPIST: Vida Rigger, MD REFERRED BY:  PROCEDURE DATE:  03/26/2013 PROCEDURE:   EGD, diagnostic ASA CLASS:   Class III INDICATIONS:Anemia.  cirrhosis  MEDICATIONS: Fentanyl 50 mcg IV and Versed 9 mg IV  TOPICAL ANESTHETIC:used  DESCRIPTION OF PROCEDURE:   After the risks benefits and alternatives of the procedure were thoroughly explained, informed consent was obtained.  The Pentax Gastroscope [id number]  endoscope was introduced through the mouth and advanced to the second portion of the duodenum , limited by Without limitations.   The instrument was slowly withdrawn as the mucosa was fully examined.the stomach was not well seen deep to increase food in the proximal stomach which could not be washed or suctioned but no signs of bleeding and no other significant lesions seen except for one small proximalduodenal AVMand no esophageal varices         FINDINGS:1 normal esophagus without varices 2 proximal stomach not seen due to increase food but no blood seen throughout the procedure 3. Tiny nonbleeding proximal duodenal AVM 4. Otherwise within normal limitssecond portion of the duodenum  COMPLICATIONS:none  ENDOSCOPIC IMPRESSION:above   RECOMMENDATIONS:ideally would proceed with outpatient colonoscopy using propofol and we could leave that to his primary gastroenterologist  in his hometown but otherwise could consider doing on Monday at [facility]   REPEAT EXAM: as needed   _______________________________ Vida Rigger, MD eSigned:  Vida Rigger, MD 03/26/2013 3:48 PM    CC:  PATIENT NAME:  Kais, Monje MR#: [id number]

## 2013-03-27 ENCOUNTER — Inpatient Hospital Stay (HOSPITAL_COMMUNITY): Payer: Medicaid Other

## 2013-03-27 DIAGNOSIS — K7460 Unspecified cirrhosis of liver: Secondary | ICD-10-CM

## 2013-03-27 DIAGNOSIS — K7290 Hepatic failure, unspecified without coma: Secondary | ICD-10-CM

## 2013-03-27 LAB — CLOSTRIDIUM DIFFICILE BY PCR: Toxigenic C. Difficile by PCR: NEGATIVE

## 2013-03-27 MED ORDER — FUROSEMIDE 40 MG PO TABS
40.0000 mg | ORAL_TABLET | Freq: Every day | ORAL | Status: DC
  Administered 2013-03-27 – 2013-03-28 (×2): 40 mg via ORAL
  Filled 2013-03-27 (×2): qty 1

## 2013-03-27 MED ORDER — NICOTINE 21 MG/24HR TD PT24
21.0000 mg | MEDICATED_PATCH | Freq: Every day | TRANSDERMAL | Status: DC
  Administered 2013-03-27 – 2013-03-28 (×2): 21 mg via TRANSDERMAL
  Filled 2013-03-27 (×2): qty 1

## 2013-03-27 MED ORDER — LACTULOSE 10 GM/15ML PO SOLN
20.0000 g | Freq: Two times a day (BID) | ORAL | Status: DC
  Administered 2013-03-27 – 2013-03-28 (×2): 20 g via ORAL
  Filled 2013-03-27 (×4): qty 30

## 2013-03-27 MED ORDER — SPIRONOLACTONE 100 MG PO TABS
100.0000 mg | ORAL_TABLET | Freq: Every day | ORAL | Status: DC
  Administered 2013-03-27 – 2013-03-28 (×2): 100 mg via ORAL
  Filled 2013-03-27 (×2): qty 1

## 2013-03-27 NOTE — Evaluation (Signed)
Physical Therapy Evaluation Patient Details Name: Alex Henry MRN: [id number] DOB: 10-Jun-1951 Today's Date: 03/27/2013 Time: 4098-1191 PT Time Calculation (min): 19 min  PT Assessment / Plan / Recommendation Clinical Impression  Patient is a 62 yo male admitted with fever, sepsis, and anemia.  Patient is independent with all mobility and gait.  Good balance with gait.  No acute PT needs identified - PT will sign off.  Encouraged ambulation in hallway with nursing.    PT Assessment  Patent does not need any further PT services    Follow Up Recommendations  No PT follow up    Does the patient have the potential to tolerate intense rehabilitation      Barriers to Discharge        Equipment Recommendations  None recommended by PT    Recommendations for Other Services     Frequency      Precautions / Restrictions Precautions Precautions: None Restrictions Weight Bearing Restrictions: No   Pertinent Vitals/Pain       Mobility  Bed Mobility Bed Mobility: Supine to Sit;Sit to Supine Supine to Sit: 6: Modified independent (Device/Increase time);With rails;HOB flat Sit to Supine: 6: Modified independent (Device/Increase time);With rail;HOB flat Details for Bed Mobility Assistance: No cues or assist needed Transfers Transfers: Sit to Stand;Stand to Sit Sit to Stand: 7: Independent;From bed Stand to Sit: 7: Independent;To bed Details for Transfer Assistance: No cues or assist needed.  Good balance in standing Ambulation/Gait Ambulation/Gait Assistance: 7: Independent Ambulation Distance (Feet): 200 Feet Assistive device: None Ambulation/Gait Assistance Details: Patient able to ambulate independently with good gait pattern and good balance. Gait Pattern: Within Functional Limits Gait velocity: WFL     PT Goals  N/A  Visit Information  Last PT Received On: 03/27/13 Assistance Needed: +1    Subjective Data  Subjective: "It's nice to have somebody around to talk  to" Patient Stated Goal: To go home   Prior Functioning  Home Living Lives With: Alone Available Help at Discharge: Family;Available PRN/intermittently Type of Home: House Home Access: Stairs to enter [employer] of Steps: 1 Entrance Stairs-Rails: None Home Layout: One level Home Adaptive Equipment: Straight cane Prior Function Level of Independence: Independent with assistive device(s) (Uses cane for longer distances) Able to Take Stairs?: Yes Driving: No Vocation: Unemployed Communication Communication: No difficulties    Cognition  Cognition Arousal/Alertness: Awake/alert Behavior During Therapy: WFL for tasks assessed/performed Overall Cognitive Status: Within Functional Limits for tasks assessed    Extremity/Trunk Assessment Right Upper Extremity Assessment RUE ROM/Strength/Tone: WFL for tasks assessed RUE Sensation: WFL - Light Touch Left Upper Extremity Assessment LUE ROM/Strength/Tone: WFL for tasks assessed LUE Sensation: WFL - Light Touch Right Lower Extremity Assessment RLE ROM/Strength/Tone: WFL for tasks assessed RLE Sensation: WFL - Light Touch Left Lower Extremity Assessment LLE ROM/Strength/Tone: WFL for tasks assessed LLE Sensation: WFL - Light Touch   Balance Balance Balance Assessed: Yes Static Standing Balance Static Standing - Balance Support: No upper extremity supported Static Standing - Level of Assistance: 7: Independent Static Standing - Comment/# of Minutes: 3 minutes.   [place] - Eyes Opened: 30 [place] - Eyes Closed: 30 High Level Balance High Level Balance Activites: Turns;Sudden stops;Head turns High Level Balance Comments: No loss of balance with high level balance activities  End of Session PT - End of Session Activity Tolerance: Patient tolerated treatment well Patient left: in bed;with call bell/phone within reach;with bed alarm set Nurse Communication: Mobility status (Encouraged ambulation in hallway with nursing)   GP  Vena Austria 03/27/2013, 7:17 PM Durenda Hurt. Renaldo Fiddler, [facility] Acute Rehab Services Pager [phone]

## 2013-03-27 NOTE — Progress Notes (Signed)
Sigmund E Moynahan 12:52 PM  Subjective: Patient without any post endoscopic problems and his case was discussed with the hospital team and no signs of bleeding  Objective: Vital signs stable afebrile abdomen is slightly distended nontender labs stable  Assessment: Cirrhosis and history of significant colon polyps  Plan: Discussed with hospital team outpatient colonoscopy by his primary gastroenterologist and please let me know if I can be of any further assistance this hospital stay  [facility] E

## 2013-03-27 NOTE — Progress Notes (Addendum)
[facility] PROGRESS NOTE  Alex Henry ZOX:[id number] DOB: [birth year] DOA: 03/23/2013 PCP: No primary provider on file.   Alex Henry is an 62 y.o. male with multiple medical problems including hx of infections with septic arthritis, epidural absesses, Hep C, discitis and osteomyelitis, alcohol and tobacco abuse, presents to the ER because of fever and general malaise. He was found to have a T max of 103. He has some yellow productive coughs, but rare. He denied any HA, sorethroat, abdominal pain, nausea, or vomiting. He does have chronic joint pain, and it is not new for him. Evaluation in the ER included WBC with no leukocytosis, negative UA and negative CXR. His Hb was found to be low at 7.5g/DL. He denied any black or bloody stools. He has normal renal fx tests, and negative procalcitonin. He was found to be hypotensive in the ER, but fortunately, he did respond to IVF to a soft SBP of 100. After blood and urine culture was done, hospitalists were asked to admit him for fever of unclear source.   Assessment/Plan: Hypotension/Dehydration  -Resolved.  Will D/C IVF. -No definitive infectious source so suspect hypotension due to hypovolemia  -dc'D empiric anbx's (6/12) with plan to follow trend   Fever  -Felt to be due to dehydration. -after admit pt admitted to "a day" of protracted N/V but denied bloody emesis or stool  -resolved see above.  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  Uncertain of follow up since.  It is important to have a follow up colonoscopy outpatient. -Iron level 25 and B12 ~700- will need PO iron at discharge   HEPATIC CIRRHOSIS / Hepatitis C / massive splenomegaly  -LFT's show slightly worsened TB from baseline of 1.4 in 2012 - now 2.5 but decreased after  hydration  -pt has appt scheduled with [facility] in [place]- unclear if established pt or new  -CT abdomen revealed massive spleen and esophageal varices  -GI consulted/EGD completed on  6/12  -restart Lasix for ascites 6/13  Mild Hepatic encephalopathy  -Ammonia level 90  -Lactulose started 6/11 with subsequent decrease in ammonia - mental status is quite slow and variable. Will decrease dose to BID due to excessive diarrhea. -Ordered CT Head (6/13) without contrast due to intermittent encephalopathy   Mild hypokalemia  -stable  -replete BID while on Lactulose  -was on Lasix pre admit   Thrombocytopenia, acquired and Abnormal prothrombin time (PT) due to cirrhosis/spelnomegaly  -baseline in 2012 was 88,000 and now is 41,000 and have been stable - suspect is new baseline  -SQ Heparin was dc'd due to low platelets and ? of occult GIB    Pain in joint, multiple sites (chronic)  -on oxycodone TID pre admit - resume at low dose to prevent withdrawal   Personal history of alcoholism  -pt denies daily ETOH and admitted to use this past WE and usually drinks periodically but then says last drank because had "shakes" so suspect drinks more than admits to so will begin CIWA and dc home Xanax for now  Thrombocytopenia, acquired and Abnormal prothrombin time (PT) due to cirrhosis/spelnomegaly  -baseline in 2012 was 88,000 and now is 41,000 and have been stable - suspect is new baseline  -SQ Heparin was dc'd due to low platelets and ?  of occult GIB   Social:  -per pt, lives alone -asked for PT/OT to assess in order to prepare safe discharge  Code Status: FULL  Family Communication: none at bedside  Disposition Plan: inpatient   Consultants:  PT/OT  Procedures:  Ordered CT Head without contrast  Endoscopy 6/12 - findings below  Antibiotics: Zosyn 6/9 >>> 6/11  Vancomycin 6/9 >>> 6/11   HPI/Subjective: 61 yo WM admitted to the hospital on 6/9 with Sepsis. He also has  hepatic cirrhosis, Hep C, pancytopenia. He has no complaints today. He is a poor historian and is slow to answer questions. He is somewhat confused about where he is. He is unable to tell us his living situation.  Denies pain anywhere and SOB.   Objective: Filed Vitals:   03/26/13 1646 03/26/13 2128 03/27/13 0000 03/27/13 0533  BP: 109/61 141/66 150/83 126/65  Pulse: 70 79 80 71  Temp: 98.2 F (36.8 C) 97.3 F (36.3 C)  98.2 F (36.8 C)  TempSrc: Oral Oral  Oral  Resp: 20 22  22   Height: 6' (1.829 m)     Weight: 95.029 kg (209 lb 8 oz)     SpO2: 99% 96%  98%    Intake/Output Summary (Last 24 hours) at 03/27/13 1038 Last data filed at 03/27/13 0730  Gross per 24 hour  Intake    580 ml  Output    801 ml  Net   -221 ml   Filed Weights   03/24/13 1647 03/26/13 0011 03/26/13 1646  Weight: 62.619 kg (138 lb 0.8 oz) 94.8 kg (208 lb 15.9 oz) 95.029 kg (209 lb 8 oz)    Exam:   General:  Alert, Oriented to person but not time and place, NAD  Cardiovascular: RRR, no m/g/r  Respiratory: clear to auscultation bilaterally  Abdomen: moderate ascites, splenomegaly, distended, NT, normoactive bowel sounds  Musculoskeletal: mild asterixis, unstable gait   SMMSE: Score of 29/30 - was able to answer all questions except interlocking pentagon. Much improved from first encounter.   Data Reviewed: Basic Metabolic Panel:  Recent Labs Lab 03/23/13 2010 03/24/13 0119 03/25/13 0410 03/26/13 0352  NA 134*  --  133* 135  K 3.2*  --  3.0* 3.7  CL 104  --  102 105  CO2 25  --  26 25  GLUCOSE 94  --  101* 93  BUN 14  --  10 6  CREATININE 1.07 1.15 0.83 0.80  CALCIUM 7.6*  --  7.4* 7.8*   Liver Function Tests:  Recent Labs Lab 03/24/13 0940 03/25/13 0410 03/26/13 0352  AST 37 38* 43*  ALT 31 29 28   ALKPHOS 25* 25* 25*  BILITOT 2.5* 1.2 0.8  PROT 5.8* 5.7* 5.6*  ALBUMIN 2.1* 2.1* 2.0*    Recent Labs Lab 03/25/13 0410 03/26/13 0352  AMMONIA 90* 65*   CBC:  Recent  Labs Lab 03/23/13 2010 03/24/13 0119 03/24/13 0940 03/25/13 0410 03/26/13 0352  WBC 5.9 6.4 5.3 2.8* 2.2*  HGB 7.5* 7.2* 8.3* 8.3* 8.4*  HCT 24.2* 23.0* 26.4* 26.1* 26.1*  MCV 83.2 83.6 84.1 83.1 82.3  PLT 49* 45* 41* 40* 44*   CBG:  Recent Labs Lab 03/24/13 0902  GLUCAP 88    Recent Results (from the past 240 hour(s))  CULTURE, BLOOD (ROUTINE X 2)     Status: None   Collection Time    03/23/13  8:00 PM      Result Value Range Status   Specimen Description BLOOD HAND  LEFT   Final   Special Requests BOTTLES DRAWN AEROBIC ONLY 10CC   Final   Culture  Setup Time 03/24/2013 05:16   Final   Culture     Final   Value:        BLOOD CULTURE RECEIVED NO GROWTH TO DATE CULTURE WILL BE HELD FOR 5 DAYS BEFORE ISSUING A FINAL NEGATIVE REPORT   Report Status PENDING   Incomplete  CULTURE, BLOOD (ROUTINE X 2)     Status: None   Collection Time    03/23/13  8:10 PM      Result Value Range Status   Specimen Description BLOOD ARM RIGHT   Final   Special Requests BOTTLES DRAWN AEROBIC AND ANAEROBIC 10CC EA   Final   Culture  Setup Time 03/24/2013 05:16   Final   Culture     Final   Value:        BLOOD CULTURE RECEIVED NO GROWTH TO DATE CULTURE WILL BE HELD FOR 5 DAYS BEFORE ISSUING A FINAL NEGATIVE REPORT   Report Status PENDING   Incomplete  URINE CULTURE     Status: None   Collection Time    03/23/13  9:19 PM      Result Value Range Status   Specimen Description URINE, RANDOM   Final   Special Requests NONE   Final   Culture  Setup Time 03/23/2013 22:58   Final   Colony Count NO GROWTH   Final   Culture NO GROWTH   Final   Report Status 03/25/2013 FINAL   Final  MRSA PCR SCREENING     Status: None   Collection Time    03/24/13 12:51 AM      Result Value Range Status   MRSA by PCR NEGATIVE  NEGATIVE Final   Comment:            The GeneXpert MRSA Assay (FDA     approved for NASAL specimens     only), is one component of a     comprehensive MRSA colonization     surveillance  program. It is not     intended to diagnose MRSA     infection nor to guide or     monitor treatment for     MRSA infections.  CLOSTRIDIUM DIFFICILE BY PCR     Status: None   Collection Time    03/26/13 10:52 PM      Result Value Range Status   C difficile by pcr NEGATIVE  NEGATIVE Final     Studies: Upper Endoscopy - Dr. Vida Rigger - 03/26/2013 Findings: Normal esophagus without varices. Proximal stomach not seen due to increase food but no blood seen throughout the procedure. Tiny non-bleeding proximal duodenal AVM. Otherwise within normal limits second position of the duodenum.  CT Abdomen Pelvis W Contrast - 03/24/2013 IMPRESSION:  1. Advanced cirrhotic changes of the liver without evidence of  hepatic mass, biliary obstruction or portal vein thrombosis. Some  gastroesophageal varices are identified by CT.  2. Massive splenomegaly.  3. Small amount of ascites.  DG Chest Port 1 View IMPRESSION:  Mild perihilar edema/infiltrates.  Scheduled Meds: . feeding supplement  237 mL Oral BID BM  . folic acid  1 mg Oral Daily  . lactulose  20 g Oral TID  . multivitamin with minerals  1 tablet Oral Daily  . pantoprazole  40 mg Oral Daily  . potassium chloride  40 mEq Oral BID  . sodium chloride  3 mL Intravenous  Q12H  . 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  I agree with the above assessment and plan.  For full details please see Mrs. Algis Downs PA note.  Hepatic  encephalopathy, alcohol liver cirrhosis.   Clint Lipps, MD [facility] Pager: [phone] 03/27/2013, 4:06 PM

## 2013-03-27 NOTE — Progress Notes (Signed)
Pt's friend, Edson Snowball, is willing to stay 24/7 with pt after d/c. Pt is agreeable to this. Shana's phone number as [phone]

## 2013-03-27 NOTE — Progress Notes (Signed)
Pt became combative toward staff. Pt tried to pull out IV multiple times. Pt urinated on the floor and used his socks to swipe it all over. Pt was cleansed, new bedding, new gown. Given ativan. Pt is now asleep.

## 2013-03-28 DIAGNOSIS — D6959 Other secondary thrombocytopenia: Secondary | ICD-10-CM

## 2013-03-28 DIAGNOSIS — R791 Abnormal coagulation profile: Secondary | ICD-10-CM

## 2013-03-28 LAB — CBC
HCT: 27.9 % — ABNORMAL LOW (ref 39.0–52.0)
Hemoglobin: 9 g/dL — ABNORMAL LOW (ref 13.0–17.0)
MCH: 26.7 pg (ref 26.0–34.0)
MCHC: 32.3 g/dL (ref 30.0–36.0)
MCV: 82.8 fL (ref 78.0–100.0)
RDW: 17.4 % — ABNORMAL HIGH (ref 11.5–15.5)

## 2013-03-28 LAB — COMPREHENSIVE METABOLIC PANEL
ALT: 29 U/L (ref 0–53)
BUN: 6 mg/dL (ref 6–23)
CO2: 28 mEq/L (ref 19–32)
Calcium: 8.2 mg/dL — ABNORMAL LOW (ref 8.4–10.5)
Creatinine, Ser: 0.77 mg/dL (ref 0.50–1.35)
GFR calc Af Amer: >90 mL/min (ref >90)
GFR calc non Af Amer: >90 mL/min (ref >90)
Glucose, Bld: 85 mg/dL (ref 70–99)
Total Protein: 6.2 g/dL (ref 6.0–8.3)

## 2013-03-28 MED ORDER — RIFAXIMIN 550 MG PO TABS: 550.0000 mg | ORAL_TABLET | Freq: Two times a day (BID) | ORAL | Status: DC

## 2013-03-28 MED ORDER — FUROSEMIDE 20 MG PO TABS: 20.0000 mg | ORAL_TABLET | Freq: Two times a day (BID) | ORAL | Status: DC

## 2013-03-28 MED ORDER — LACTULOSE 10 GM/15ML PO SOLN: 20.0000 g | Freq: Two times a day (BID) | ORAL | Status: DC

## 2013-03-28 MED ORDER — SPIRONOLACTONE 50 MG PO TABS: 50.0000 mg | ORAL_TABLET | Freq: Every day | ORAL | Status: DC

## 2013-03-28 MED ORDER — RIFAXIMIN 550 MG PO TABS
550.0000 mg | ORAL_TABLET | Freq: Two times a day (BID) | ORAL | Status: DC
  Administered 2013-03-28: 550 mg via ORAL
  Filled 2013-03-28 (×3): qty 1

## 2013-03-28 NOTE — Discharge Summary (Signed)
Physician Discharge Summary  Alex Henry XWR:[id number] DOB: 04/17/1951 DOA: 03/23/2013  PCP: Alex Proffer, MD Patient follows up with Alex Henry a NP with Dr. Ardelle Park  Admit date: 03/23/2013 Discharge date: 03/28/2013  Time spent: 40 minutes minutes  Recommendations for Outpatient Follow-up:  1. Followup with primary care physician one week. 2. Followup with GI for colonoscopy in 1-2 weeks.  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  Filed Weights   03/24/13 1647 03/26/13 0011 03/26/13 1646  Weight: 62.619 kg (138 lb 0.8 oz) 94.8 kg (208 lb 15.9 oz) 95.029 kg (209 lb 8 oz)    History of present illness:  Alex Henry is an 62 y.o. male with multiple medical problems including hx of infections with septic arthritis, epidural absesses, Hep C, discitis and osteomyelitis, alcohol and tobacco abuse, presents to the ER because of fever and general malaise. He was found to have a T max of 103. He has some yellow productive coughs, but rare. He denied any HA, sorethroat, abdominal pain, nausea, or vomiting. He does have chronic joint pain, and it is not new for him. Evaluation in the ER included WBC with no leukocytosis, negative UA and negative CXR. His Hb was found to be low at 7.5g/DL. He denied any black or bloody stools. He has normal renal fx tests, and negative procalcitonin. He was found to be hypotensive in the ER, but fortunately, he did respond to IVF to a soft SBP of 100. After blood and urine culture was done, hospitalist was asked to admit him for fever of unclear  source  Hospital Course:   1. Hypotension: The time of admission patient presented with hypertension, fever of 103 and generalized malaise. Patient initial picture was very consistent with sepsis. So patient was admitted to step down unit and started on empiric antibiotics. Please note that patient did not have leukocytosis, all of his cultures come back negative, this chest x-ray and UA were negative. Then the antibiotics were discontinued on 6/12, patient still maintained without fever and he did very well. Hypertension was thought to be secondary to volume depletion. Fever source was unclear but might be secondary to transient viral infection. Patient off of antibiotics now and he does not have fever, chills or leukocytosis.  2. Anemia, chronic: Hemoglobin baseline is about 11-12 and 2012, patient had hemoglobin of 7.5 on admission. This is improved without transfusion to 8.3 and then 9.0 on the day of discharge. There was 2 units typed and crossmatched, but there is no evidence of actual transfusion per records.  3. Positive Hemoccult: Because of the anemia and history of hepatic cirrhosis gastroenterology was consulted, endoscopy was done on 03/26/2013 by Dr. Ewing Schlein, no evidence of active bleeding. Patient iron level is 25, he might need to be started on iron supplementation as outpatient. GI strongly recommended for patient to followup with his primary gastroenterologist for colonoscopy. Per records patient had colon polyp with high-grade dysplasia or invasive malignancy June 2011, patient has not seen in followup for that.  4. Mild hepatic encephalopathy: Ammonia level was 90, patient  started on lactulose, mental status improved slowly. On the day of discharge patient is awake, alert and oriented x3. CT head scan showed no acute abnormalities. This is done because of patient's coagulopathy and he is high risk for falls.  5. Hepatic cirrhosis: Secondary to alcoholism and hepatitis C. Patient has  pancytopenia, hypoalbuminemia, increased AST and elevated INR of 1.8. Patient counseled extensively about quitting alcohol, started on Lasix 20 mg and Aldactone 50 mg. Lactulose twice a day and Xifaxan 550 twice a day.  6. Chronic pain: Patient is on oxycodone 3 times a day preadmission that was continued, patient also on Xanax and these medications thought to contribute to his confusion. Xanax was discontinued on discharge, please try to take him off of oxycodone F. Possible.  7. History of alcohol abuse: Patient denies any alcohol use and admitted to this in the past, patient was on CIWA protocol for some time but his symptoms did not continue so was taken off of the Ativan protocol..  8. Disposition: Patient will be discharged home, patient said he has a friend will stay with him all the time, PT so the patient recommended no PT followup.  Procedures:  EGD done by Dr. Ewing Schlein and showed normal esophagus without varices, proximal stomach not seen but increase food but no blood seen throughout the procedure, there is tiny nonbleeding proximal duodenal AVMs otherwise within normal limits.  Consultations:  [facility]  Discharge Exam: Filed Vitals:   03/27/13 0000 03/27/13 0533 03/27/13 1324 03/27/13 2214  BP: 150/83 126/65 138/85 117/72  Pulse: 80 71 76 79  Temp:  98.2 F (36.8 C) 98.5 F (36.9 C) 99.7 F (37.6 C)  TempSrc:  Oral Oral Oral  Resp:  22 18 20   Height:      Weight:      SpO2:  98% 98% 95%   General: Alert and awake, oriented x3, not in any acute distress. HEENT: anicteric sclera, pupils reactive to light and accommodation, EOMI CVS: S1-S2 clear, no murmur rubs or gallops Chest: clear to auscultation bilaterally, no wheezing, rales or rhonchi Abdomen: soft nontender, nondistended, normal bowel sounds, no organomegaly Extremities: no cyanosis, clubbing or edema noted bilaterally Neuro: Cranial nerves II-XII intact, no focal neurological deficits  Discharge  Instructions  Discharge Orders   Future Orders Complete By Expires     Diet - low sodium heart healthy  As directed     Increase activity slowly  As directed         Medication List    STOP taking these medications       ALPRAZolam 1 MG tablet  Commonly known as:  XANAX      TAKE these medications       furosemide 20 MG tablet  Commonly known as:  LASIX  Take 1 tablet (20 mg total) by mouth 2 (two) times daily.     lactulose 10 GM/15ML solution  Commonly known as:  CHRONULAC  Take 30 mLs (20 g total) by mouth 2 (two) times daily.     omeprazole 40 MG capsule  Commonly known as:  PRILOSEC  Take 40 mg by mouth daily.     Oxycodone HCl 10 MG Tabs  Take 10 mg by mouth 3 (three) times daily.     rifaximin 550 MG Tabs  Commonly known as:  XIFAXAN  Take 1 tablet (550 mg total) by mouth 2 (two) times daily.     spironolactone 50 MG tablet  Commonly known as:  ALDACTONE  Take  1 tablet (50 mg total) by mouth daily.       Allergies  Allergen Reactions  . Ceftriaxone Sodium     REACTION: RASH, severe  . Codeine Rash       Follow-up Information   Follow up with HAGUE, Myrene Galas, MD In 1 week.   Contact information:   [address] [place] [address] [phone]        The results of significant diagnostics from this hospitalization (including imaging, microbiology, ancillary and laboratory) are listed below for reference.    Significant Diagnostic Studies: Ct Head Wo Contrast  03/27/2013   *RADIOLOGY REPORT*  Clinical Data: Intermittent encephalopathy.  Hepatic cirrhosis. Hepatitis C.  Alcoholism.  CT HEAD WITHOUT CONTRAST  Technique:  Contiguous axial images were obtained from the base of the skull through the vertex without contrast.  Comparison: 06/22/2012.  Findings: No  acute stroke or hemorrhage.  No mass lesion or hydrocephalus.  No extra-axial fluid.  Normal appearing calvarium.  Mild atrophy.  Mild chronic microvascular ischemic change.  No  visible acute sinus or mastoid disease.  No change from 2013.  IMPRESSION: Mild atrophy and chronic microvascular ischemic change.  No acute intracranial findings.   Original Report Authenticated By: Davonna Belling, M.D.   Ct Abdomen Pelvis W Contrast  03/24/2013   *RADIOLOGY REPORT*  Clinical Data: Abdominal pain and history of cirrhosis.  CT ABDOMEN AND PELVIS WITH CONTRAST  Technique:  Multidetector CT imaging of the abdomen and pelvis was performed following the standard protocol during bolus administration of intravenous contrast.  Contrast: OMNIPAQUE IOHEXOL 300 MG/ML  SOLN  Comparison: Abdominal ultrasound dated 06/24/2012 performed at [facility].  Findings: Moderately advanced cirrhotic changes are noted of the liver without evidence of hepatic mass or biliary ductal dilatation.  The portal vein is open.  Splenic and mesenteric veins show no evidence of thrombosis.  Findings consistent with portal hypertension with gastroesophageal varices identified. The spleen is massively enlarged with dimensions of approximately 18.1 x 12.7 x 21 cm.  Estimated splenic volume is 2510 ml.  No evidence of splenic infarct or mass.  A mild amount of ascites is seen around the liver with small pockets also elsewhere in the peritoneal cavity.  The gallbladder is relatively contracted and is surrounded by some ascites.  The pancreas, adrenal glands and kidneys are unremarkable.  No enlarged lymph nodes are seen.  The bladder is unremarkable.  No hernias are seen.  Atherosclerotic plaque of the abdominal aorta present without aneurysm.  Moderately advanced spondylosis of the lumbar spine present, most prominently at L4-5.  IMPRESSION:  1.  Advanced cirrhotic changes of the liver without evidence of hepatic mass, biliary obstruction or portal vein thrombosis.  Some gastroesophageal varices are identified by CT. 2.  Massive splenomegaly. 3.  Small amount of ascites.   Original Report Authenticated By: Irish Lack,  M.D.   Dg Chest Port 1 View  (if Code Sepsis Called)  03/23/2013   *RADIOLOGY REPORT*  Clinical Data: Fever  PORTABLE CHEST - 1 VIEW  Comparison: 06/24/2012  Findings: Mild cardiomegaly stable.  No effusion.  Mild perihilar and infrahilar interstitial prominence possibly early     edema. Atheromatous aortic arch.  Old healed left clavicle fracture.  IMPRESSION:  Mild perihilar edema/infiltrates.   Original Report Authenticated By: D. Andria Rhein, MD    Microbiology: Recent Results (from the past 240 hour(s))  CULTURE, BLOOD (ROUTINE X 2)     Status: None   Collection Time  03/23/13  8:00 PM      Result Value Range Status   Specimen Description BLOOD HAND LEFT   Final   Special Requests BOTTLES DRAWN AEROBIC ONLY 10CC   Final   Culture  Setup Time 03/24/2013 05:16   Final   Culture     Final   Value:        BLOOD CULTURE RECEIVED NO GROWTH TO DATE CULTURE WILL BE HELD FOR 5 DAYS BEFORE ISSUING A FINAL NEGATIVE REPORT   Report Status PENDING   Incomplete  CULTURE, BLOOD (ROUTINE X 2)     Status: None   Collection Time    03/23/13  8:10 PM      Result Value Range Status   Specimen Description BLOOD ARM RIGHT   Final   Special Requests BOTTLES DRAWN AEROBIC AND ANAEROBIC 10CC EA   Final   Culture  Setup Time 03/24/2013 05:16   Final   Culture     Final   Value:        BLOOD CULTURE RECEIVED NO GROWTH TO DATE CULTURE WILL BE HELD FOR 5 DAYS BEFORE ISSUING A FINAL NEGATIVE REPORT   Report Status PENDING   Incomplete  URINE CULTURE     Status: None   Collection Time    03/23/13  9:19 PM      Result Value Range Status   Specimen Description URINE, RANDOM   Final   Special Requests NONE   Final   Culture  Setup Time 03/23/2013 22:58   Final   Colony Count NO GROWTH   Final   Culture NO GROWTH   Final   Report Status 03/25/2013 FINAL   Final  MRSA PCR SCREENING     Status: None   Collection Time    03/24/13 12:51 AM      Result Value Range Status   MRSA by PCR NEGATIVE  NEGATIVE  Final   Comment:            The GeneXpert MRSA Assay (FDA     approved for NASAL specimens     only), is one component of a     comprehensive MRSA colonization     surveillance program. It is not     intended to diagnose MRSA     infection nor to guide or     monitor treatment for     MRSA infections.  CLOSTRIDIUM DIFFICILE BY PCR     Status: None   Collection Time    03/26/13 10:52 PM      Result Value Range Status   C difficile by pcr NEGATIVE  NEGATIVE Final     Labs: Basic Metabolic Panel:  Recent Labs Lab 03/23/13 2010 03/24/13 0119 03/25/13 0410 03/26/13 0352 03/28/13 0350  NA 134*  --  133* 135 136  K 3.2*  --  3.0* 3.7 3.8  CL 104  --  102 105 103  CO2 25  --  26 25 28   GLUCOSE 94  --  101* 93 85  BUN 14  --  10 6 6   CREATININE 1.07 1.15 0.83 0.80 0.77  CALCIUM 7.6*  --  7.4* 7.8* 8.2*   Liver Function Tests:  Recent Labs Lab 03/24/13 0940 03/25/13 0410 03/26/13 0352 03/28/13 0350  AST 37 38* 43* 50*  ALT 31 29 28 29   ALKPHOS 25* 25* 25* 25*  BILITOT 2.5* 1.2 0.8 1.0  PROT 5.8* 5.7* 5.6* 6.2  ALBUMIN 2.1* 2.1* 2.0* 2.4*   No results found  for this basename: LIPASE, AMYLASE,  in the last 168 hours  Recent Labs Lab 03/25/13 0410 03/26/13 0352 03/28/13 0350  AMMONIA 90* 65* 39   CBC:  Recent Labs Lab 03/24/13 0119 03/24/13 0940 03/25/13 0410 03/26/13 0352 03/28/13 0350  WBC 6.4 5.3 2.8* 2.2* 2.6*  HGB 7.2* 8.3* 8.3* 8.4* 9.0*  HCT 23.0* 26.4* 26.1* 26.1* 27.9*  MCV 83.6 84.1 83.1 82.3 82.8  PLT 45* 41* 40* 44* 38*   Cardiac Enzymes: No results found for this basename: CKTOTAL, CKMB, CKMBINDEX, TROPONINI,  in the last 168 hours BNP: BNP (last 3 results) No results found for this basename: PROBNP,  in the last 8760 hours CBG:  Recent Labs Lab 03/24/13 0902  GLUCAP 88       Signed:  Toshua Honsinger A  [facility] 03/28/2013, 1:08 PM

## 2013-03-28 NOTE — Progress Notes (Signed)
Pt given discharge instructions, prescriptions, and PIV removed.  Taken to patient drop off via wheel chair.

## 2013-03-30 LAB — CULTURE, BLOOD (ROUTINE X 2): Culture: NO GROWTH

## 2013-12-30 ENCOUNTER — Emergency Department (HOSPITAL_COMMUNITY): Payer: Medicaid Other

## 2013-12-30 ENCOUNTER — Inpatient Hospital Stay (HOSPITAL_COMMUNITY)
Admission: EM | Admit: 2013-12-30 | Discharge: 2014-01-06 | DRG: 871 | Disposition: A | Discharge status: Skilled Nursing Facility | Payer: Medicaid Other | Attending: Internal Medicine | Admitting: Internal Medicine

## 2013-12-30 ENCOUNTER — Encounter (HOSPITAL_COMMUNITY): Payer: Self-pay | Admitting: Emergency Medicine

## 2013-12-30 DIAGNOSIS — D649 Anemia, unspecified: Secondary | ICD-10-CM | POA: Diagnosis present

## 2013-12-30 DIAGNOSIS — B182 Chronic viral hepatitis C: Secondary | ICD-10-CM

## 2013-12-30 DIAGNOSIS — R7881 Bacteremia: Secondary | ICD-10-CM

## 2013-12-30 DIAGNOSIS — E8779 Other fluid overload: Secondary | ICD-10-CM

## 2013-12-30 DIAGNOSIS — J449 Chronic obstructive pulmonary disease, unspecified: Secondary | ICD-10-CM | POA: Diagnosis present

## 2013-12-30 DIAGNOSIS — R188 Other ascites: Secondary | ICD-10-CM | POA: Diagnosis present

## 2013-12-30 DIAGNOSIS — K7682 Hepatic encephalopathy: Secondary | ICD-10-CM

## 2013-12-30 DIAGNOSIS — K7290 Hepatic failure, unspecified without coma: Secondary | ICD-10-CM | POA: Diagnosis present

## 2013-12-30 DIAGNOSIS — F3289 Other specified depressive episodes: Secondary | ICD-10-CM

## 2013-12-30 DIAGNOSIS — R55 Syncope and collapse: Secondary | ICD-10-CM

## 2013-12-30 DIAGNOSIS — M2550 Pain in unspecified joint: Secondary | ICD-10-CM

## 2013-12-30 DIAGNOSIS — N179 Acute kidney failure, unspecified: Secondary | ICD-10-CM | POA: Diagnosis present

## 2013-12-30 DIAGNOSIS — J9600 Acute respiratory failure, unspecified whether with hypoxia or hypercapnia: Secondary | ICD-10-CM | POA: Diagnosis present

## 2013-12-30 DIAGNOSIS — E7220 Disorder of urea cycle metabolism, unspecified: Secondary | ICD-10-CM | POA: Diagnosis present

## 2013-12-30 DIAGNOSIS — K5909 Other constipation: Secondary | ICD-10-CM

## 2013-12-30 DIAGNOSIS — R509 Fever, unspecified: Secondary | ICD-10-CM

## 2013-12-30 DIAGNOSIS — F411 Generalized anxiety disorder: Secondary | ICD-10-CM

## 2013-12-30 DIAGNOSIS — I959 Hypotension, unspecified: Secondary | ICD-10-CM

## 2013-12-30 DIAGNOSIS — R451 Restlessness and agitation: Secondary | ICD-10-CM

## 2013-12-30 DIAGNOSIS — A0472 Enterocolitis due to Clostridium difficile, not specified as recurrent: Secondary | ICD-10-CM

## 2013-12-30 DIAGNOSIS — IMO0002 Reserved for concepts with insufficient information to code with codable children: Secondary | ICD-10-CM | POA: Diagnosis not present

## 2013-12-30 DIAGNOSIS — L02419 Cutaneous abscess of limb, unspecified: Secondary | ICD-10-CM | POA: Diagnosis present

## 2013-12-30 DIAGNOSIS — J690 Pneumonitis due to inhalation of food and vomit: Secondary | ICD-10-CM

## 2013-12-30 DIAGNOSIS — R030 Elevated blood-pressure reading, without diagnosis of hypertension: Secondary | ICD-10-CM

## 2013-12-30 DIAGNOSIS — D638 Anemia in other chronic diseases classified elsewhere: Secondary | ICD-10-CM

## 2013-12-30 DIAGNOSIS — F329 Major depressive disorder, single episode, unspecified: Secondary | ICD-10-CM | POA: Diagnosis present

## 2013-12-30 DIAGNOSIS — Z87448 Personal history of other diseases of urinary system: Secondary | ICD-10-CM

## 2013-12-30 DIAGNOSIS — R6520 Severe sepsis without septic shock: Secondary | ICD-10-CM

## 2013-12-30 DIAGNOSIS — G9340 Encephalopathy, unspecified: Secondary | ICD-10-CM | POA: Diagnosis present

## 2013-12-30 DIAGNOSIS — K219 Gastro-esophageal reflux disease without esophagitis: Secondary | ICD-10-CM

## 2013-12-30 DIAGNOSIS — L0390 Cellulitis, unspecified: Secondary | ICD-10-CM

## 2013-12-30 DIAGNOSIS — J4489 Other specified chronic obstructive pulmonary disease: Secondary | ICD-10-CM | POA: Diagnosis present

## 2013-12-30 DIAGNOSIS — E860 Dehydration: Secondary | ICD-10-CM

## 2013-12-30 DIAGNOSIS — D696 Thrombocytopenia, unspecified: Secondary | ICD-10-CM

## 2013-12-30 DIAGNOSIS — F1021 Alcohol dependence, in remission: Secondary | ICD-10-CM

## 2013-12-30 DIAGNOSIS — E8809 Other disorders of plasma-protein metabolism, not elsewhere classified: Secondary | ICD-10-CM | POA: Diagnosis present

## 2013-12-30 DIAGNOSIS — K7460 Unspecified cirrhosis of liver: Secondary | ICD-10-CM

## 2013-12-30 DIAGNOSIS — L03119 Cellulitis of unspecified part of limb: Secondary | ICD-10-CM

## 2013-12-30 DIAGNOSIS — M25519 Pain in unspecified shoulder: Secondary | ICD-10-CM

## 2013-12-30 DIAGNOSIS — J309 Allergic rhinitis, unspecified: Secondary | ICD-10-CM

## 2013-12-30 DIAGNOSIS — G062 Extradural and subdural abscess, unspecified: Secondary | ICD-10-CM

## 2013-12-30 DIAGNOSIS — Z862 Personal history of diseases of the blood and blood-forming organs and certain disorders involving the immune mechanism: Secondary | ICD-10-CM

## 2013-12-30 DIAGNOSIS — R791 Abnormal coagulation profile: Secondary | ICD-10-CM

## 2013-12-30 DIAGNOSIS — I10 Essential (primary) hypertension: Secondary | ICD-10-CM | POA: Diagnosis present

## 2013-12-30 DIAGNOSIS — G8929 Other chronic pain: Secondary | ICD-10-CM

## 2013-12-30 DIAGNOSIS — M009 Pyogenic arthritis, unspecified: Secondary | ICD-10-CM

## 2013-12-30 DIAGNOSIS — A419 Sepsis, unspecified organism: Principal | ICD-10-CM

## 2013-12-30 DIAGNOSIS — M519 Unspecified thoracic, thoracolumbar and lumbosacral intervertebral disc disorder: Secondary | ICD-10-CM

## 2013-12-30 DIAGNOSIS — Z87891 Personal history of nicotine dependence: Secondary | ICD-10-CM

## 2013-12-30 HISTORY — DX: Essential (primary) hypertension: I10

## 2013-12-30 HISTORY — DX: Chronic obstructive pulmonary disease, unspecified: J44.9

## 2013-12-30 MED ORDER — VANCOMYCIN HCL IN DEXTROSE 1-5 GM/200ML-% IV SOLN
1000.0000 mg | Freq: Once | INTRAVENOUS | Status: AC
  Administered 2013-12-31: 1000 mg via INTRAVENOUS
  Filled 2013-12-30: qty 200

## 2013-12-30 MED ORDER — SODIUM CHLORIDE 0.9 % IV BOLUS (SEPSIS)
1000.0000 mL | Freq: Once | INTRAVENOUS | Status: AC
  Administered 2013-12-31: 1000 mL via INTRAVENOUS

## 2013-12-30 MED ORDER — ACETAMINOPHEN 650 MG RE SUPP
650.0000 mg | Freq: Once | RECTAL | Status: AC
  Administered 2013-12-30: 650 mg via RECTAL
  Filled 2013-12-30: qty 1

## 2013-12-30 MED ORDER — SODIUM CHLORIDE 0.9 % IV BOLUS (SEPSIS)
1000.0000 mL | Freq: Once | INTRAVENOUS | Status: AC
  Administered 2013-12-30: 1000 mL via INTRAVENOUS

## 2013-12-30 NOTE — ED Notes (Signed)
Patient from home, family stated in the last 36 hours he has become lethargic and febrile.  Patient is ST at 100 and wheezing in lower lobes.  Patient with many co morbidities.  Patient is answering appropriately.  EKG ST.

## 2013-12-30 NOTE — ED Provider Notes (Signed)
CSN: [id number]     Arrival date & time 12/30/13  2313 History   First MD Initiated Contact with Patient 12/30/13 2320     Chief Complaint  Patient presents with  . Fever  . Fatigue     (Consider location/radiation/quality/duration/timing/severity/associated sxs/prior Treatment) HPI 63 year old male presents to emergency department as possible.  Code sepsis.  Per EMS, family has reported lethargy for the last 36 hours, and fever.  Patient arrives with wheezing, and sinus tach.  He has no complaints.  Patient has history of hepatitis C and cirrhosis.  Per EMS.  Prior records reviewed, patient has history of bacteremia, septic arthritis, C. difficile infection, discitis, epidural abscess.   Past Medical History  Diagnosis Date  . Bacteremia 02/2010    GBS bacteremia - tx with Abx x 28 days  . Septic arthritis     sternoclavicular  . Hepatitis C DX: 03/2010    VL (03/2010) 5,940,000  . Depression   . Anxiety   . History of Clostridium difficile infection   . Discitis   . Epidural abscess     H/O   . Elevated blood pressure   . Shoulder pain, bilateral   . Anemia   . Hepatic cirrhosis   . Hepatic encephalopathy 03/2010  . Allergic rhinitis     chronic  . Drug-induced constipation   . Osteomyelitis 05/2010, 07/2010    Diskitis and osteomyelitis at C6-C7   . Thrombocytopenia   . Alcohol abuse   . Atypical chest pain     Seen at [facility] (11/2010) - thought to be 2/2 GERD. Lexiscan Stress test (12/05/10) - at [facility]. No ischemia seen, normal EF of 65%  . COPD (chronic obstructive pulmonary disease)   . Hypertension    Past Surgical History  Procedure Laterality Date  . Esophagogastroduodenoscopy N/A 03/26/2013    Procedure: ESOPHAGOGASTRODUODENOSCOPY (EGD);  Surgeon: Petra Kuba, MD;  Location[facility] ENDOSCOPY;  Service: Endoscopy;  Laterality: N/A;   Family History  Problem Relation Age of Onset  . Heart disease Father   . Heart attack Father   . Cancer  Maternal Aunt 71    colon cancer  . Arthritis Mother   . Arthritis Sister   . Anxiety disorder Sister   . Arthritis Sister   . Anxiety disorder Sister   . Cirrhosis Brother     and hepatitis c  . Cirrhosis Brother     and hepatitis c   History  Substance Use Topics  . Smoking status: Former Smoker -- 1.25 packs/day for 30 years    Quit date: 10/16/2003  . Smokeless tobacco: Current User    Types: Snuff  . Alcohol Use: No     Comment: Quit 03/2010. Used to drink 12 pack daily + more at parties since 63yo - 63yo    Review of Systems  Unable to perform ROS: Acuity of condition      Allergies  Ceftriaxone sodium and Codeine  Home Medications   Current Outpatient Rx  Name  Route  Sig  Dispense  Refill  . furosemide (LASIX) 20 MG tablet   Oral   Take 1 tablet (20 mg total) by mouth 2 (two) times daily.   30 tablet   0   . lactulose (CHRONULAC) 10 GM/15ML solution   Oral   Take 30 mLs (20 g total) by mouth 2 (two) times daily.   240 mL   0   . omeprazole (PRILOSEC) 40 MG capsule   Oral  Take 40 mg by mouth daily.         . Oxycodone HCl 10 MG TABS   Oral   Take 10 mg by mouth 3 (three) times daily.         . rifaximin (XIFAXAN) 550 MG TABS   Oral   Take 1 tablet (550 mg total) by mouth 2 (two) times daily.   60 tablet   0   . spironolactone (ALDACTONE) 50 MG tablet   Oral   Take 1 tablet (50 mg total) by mouth daily.   30 tablet   0    BP 110/56  Pulse 96  Temp(Src) 102.7 F (39.3 C) (Oral)  Resp 20  SpO2 100% Physical Exam  Nursing note and vitals reviewed. Constitutional: He is oriented to person, place, and time. He appears distressed.  Ill-appearing, older than stated age  HENT:  Head: Normocephalic and atraumatic.  Right Ear: External ear normal.  Left Ear: External ear normal.  Dry mucous membranes  Eyes: Conjunctivae and EOM are normal.  Neck: Normal range of motion. Neck supple. No JVD present. No tracheal deviation present. No  thyromegaly present.  Cardiovascular: Normal heart sounds and intact distal pulses.  Exam reveals no gallop and no friction rub.   No murmur heard. Tachycardia  Pulmonary/Chest: No stridor. He has wheezes.  Abdominal: Soft. Bowel sounds are normal. He exhibits distension. He exhibits no mass. There is no tenderness. There is no rebound and no guarding.  Musculoskeletal: He exhibits edema and tenderness.  Lymphadenopathy:    He has no cervical adenopathy.  Neurological: He is alert and oriented to person, place, and time. He exhibits normal muscle tone. Coordination normal.  Skin: Skin is warm and dry.  Area of erythema to right lower half that is circumferential, and ends at the sock line, and begins just below the knee area and areas of blanching with petechiae, with contained within the area of erythema.  There is no focal abscess within this area  Psychiatric: He has a normal mood and affect. His behavior is normal. Judgment and thought content normal.    ED Course  Procedures (including critical care time) Labs Review Labs Reviewed  CBC WITH DIFFERENTIAL - Abnormal; Notable for the following:    RBC 2.97 (*)    Hemoglobin 7.9 (*)    HCT 24.9 (*)    RDW 16.8 (*)    Platelets 54 (*)    Monocytes Relative 14 (*)    All other components within normal limits  COMPREHENSIVE METABOLIC PANEL - Abnormal; Notable for the following:    Sodium 132 (*)    Glucose, Bld 109 (*)    Calcium 8.2 (*)    Albumin 2.8 (*)    AST 82 (*)    Alkaline Phosphatase 22 (*)    GFR calc non Af Amer 64 (*)    GFR calc Af Amer 74 (*)    All other components within normal limits  URINALYSIS, ROUTINE W REFLEX MICROSCOPIC - Abnormal; Notable for the following:    Color, Urine AMBER (*)    Hgb urine dipstick LARGE (*)    Bilirubin Urine SMALL (*)    Urobilinogen, UA 2.0 (*)    All other components within normal limits  AMMONIA - Abnormal; Notable for the following:    Ammonia 70 (*)    All other  components within normal limits  CULTURE, BLOOD (ROUTINE X 2)  CULTURE, BLOOD (ROUTINE X 2)  URINE CULTURE  URINE MICROSCOPIC-ADD ON  I-STAT CG4 LACTIC ACID, ED  SAMPLE TO BLOOD BANK   Imaging Review Dg Chest Port 1 View  12/30/2013   CLINICAL DATA:  Fever and wheezing.  EXAM: PORTABLE CHEST - 1 VIEW  COMPARISON:  Single view of the chest 07/10/2013.  FINDINGS: Heart size and mediastinal contours are within normal limits. Both lungs are clear. Visualized skeletal structures are unremarkable.  IMPRESSION: Negative exam.   Electronically Signed   By: Drusilla Kanner M.D.   On: 12/30/2013 23:46     EKG Interpretation None      MDM   Final diagnoses:  Cellulitis  Thrombocytopenia, acquired due to cirrhosis  Fever    63 year old male with fever, tachycardia, probable cellulitis, and possible pneumonia.  Patient fits code sepsis.  Beginning IV fluid boluses, checking lactate.  Patient will need admission to the hospital.  Once cultures have been completed, we'll start antibiotics.    Olivia Mackie, MD 12/31/13 [phone]

## 2013-12-31 ENCOUNTER — Inpatient Hospital Stay (HOSPITAL_COMMUNITY): Payer: Medicaid Other

## 2013-12-31 ENCOUNTER — Encounter (HOSPITAL_COMMUNITY): Payer: Self-pay | Admitting: Internal Medicine

## 2013-12-31 DIAGNOSIS — G9340 Encephalopathy, unspecified: Secondary | ICD-10-CM

## 2013-12-31 DIAGNOSIS — R7881 Bacteremia: Secondary | ICD-10-CM

## 2013-12-31 DIAGNOSIS — K7460 Unspecified cirrhosis of liver: Secondary | ICD-10-CM

## 2013-12-31 DIAGNOSIS — D638 Anemia in other chronic diseases classified elsewhere: Secondary | ICD-10-CM

## 2013-12-31 DIAGNOSIS — L0291 Cutaneous abscess, unspecified: Secondary | ICD-10-CM

## 2013-12-31 DIAGNOSIS — L0390 Cellulitis, unspecified: Secondary | ICD-10-CM | POA: Diagnosis present

## 2013-12-31 LAB — I-STAT ARTERIAL BLOOD GAS, ED
ACID-BASE DEFICIT: 4 mmol/L — AB (ref 0.0–2.0)
Acid-base deficit: 4 mmol/L — ABNORMAL HIGH (ref 0.0–2.0)
BICARBONATE: 20.8 meq/L (ref 20.0–24.0)
Bicarbonate: 19.8 mEq/L — ABNORMAL LOW (ref 20.0–24.0)
O2 SAT: 100 %
O2 Saturation: 100 %
PCO2 ART: 32.5 mmHg — AB (ref 35.0–45.0)
Patient temperature: 96.7
TCO2: 21 mmol/L (ref 0–100)
TCO2: 22 mmol/L (ref 0–100)
pCO2 arterial: 36 mmHg (ref 35.0–45.0)
pH, Arterial: 7.394 (ref 7.350–7.450)
pH, Arterial: 7.365 (ref 7.350–7.450)
pO2, Arterial: 178 mmHg — ABNORMAL HIGH (ref 80.0–100.0)
pO2, Arterial: 398 mmHg — ABNORMAL HIGH (ref 80.0–100.0)

## 2013-12-31 LAB — URINE MICROSCOPIC-ADD ON

## 2013-12-31 LAB — URINALYSIS, ROUTINE W REFLEX MICROSCOPIC
Glucose, UA: NEGATIVE mg/dL
KETONES UR: NEGATIVE mg/dL
Leukocytes, UA: NEGATIVE
NITRITE: NEGATIVE
Protein, ur: NEGATIVE mg/dL
Specific Gravity, Urine: 1.023 (ref 1.005–1.030)
Urobilinogen, UA: 2 mg/dL — ABNORMAL HIGH (ref 0.0–1.0)
pH: 5.5 (ref 5.0–8.0)

## 2013-12-31 LAB — CBC WITH DIFFERENTIAL/PLATELET
BASOS ABS: 0 10*3/uL (ref 0.0–0.1)
BASOS PCT: 0 % (ref 0–1)
EOS PCT: 0 % (ref 0–5)
Eosinophils Absolute: 0 10*3/uL (ref 0.0–0.7)
HEMATOCRIT: 24.9 % — AB (ref 39.0–52.0)
Hemoglobin: 7.9 g/dL — ABNORMAL LOW (ref 13.0–17.0)
Lymphocytes Relative: 22 % (ref 12–46)
Lymphs Abs: 1.4 10*3/uL (ref 0.7–4.0)
MCH: 26.6 pg (ref 26.0–34.0)
MCHC: 31.7 g/dL (ref 30.0–36.0)
MCV: 83.8 fL (ref 78.0–100.0)
Monocytes Absolute: 0.9 10*3/uL (ref 0.1–1.0)
Monocytes Relative: 14 % — ABNORMAL HIGH (ref 3–12)
NEUTROS ABS: 3.9 10*3/uL (ref 1.7–7.7)
Neutrophils Relative %: 63 % (ref 43–77)
Platelets: 54 10*3/uL — ABNORMAL LOW (ref 150–400)
RBC: 2.97 MIL/uL — ABNORMAL LOW (ref 4.22–5.81)
RDW: 16.8 % — AB (ref 11.5–15.5)
WBC: 6.1 10*3/uL (ref 4.0–10.5)

## 2013-12-31 LAB — GLUCOSE, CAPILLARY
GLUCOSE-CAPILLARY: 92 mg/dL (ref 70–99)
Glucose-Capillary: 106 mg/dL — ABNORMAL HIGH (ref 70–99)
Glucose-Capillary: 98 mg/dL (ref 70–99)
Glucose-Capillary: 82 mg/dL (ref 70–99)

## 2013-12-31 LAB — COMPREHENSIVE METABOLIC PANEL
ALBUMIN: 2.8 g/dL — AB (ref 3.5–5.2)
ALT: 49 U/L (ref 0–53)
AST: 82 U/L — ABNORMAL HIGH (ref 0–37)
Alkaline Phosphatase: 22 U/L — ABNORMAL LOW (ref 39–117)
BUN: 14 mg/dL (ref 6–23)
CALCIUM: 8.2 mg/dL — AB (ref 8.4–10.5)
CO2: 20 mEq/L (ref 19–32)
CREATININE: 1.18 mg/dL (ref 0.50–1.35)
Chloride: 99 mEq/L (ref 96–112)
GFR calc Af Amer: 74 mL/min — ABNORMAL LOW (ref >90)
GFR calc non Af Amer: 64 mL/min — ABNORMAL LOW (ref >90)
Glucose, Bld: 109 mg/dL — ABNORMAL HIGH (ref 70–99)
Potassium: 3.7 mEq/L (ref 3.7–5.3)
Sodium: 132 mEq/L — ABNORMAL LOW (ref 137–147)
TOTAL PROTEIN: 6.9 g/dL (ref 6.0–8.3)
Total Bilirubin: 1.2 mg/dL (ref 0.3–1.2)

## 2013-12-31 LAB — PROCALCITONIN: Procalcitonin: 0.19 ng/mL

## 2013-12-31 LAB — MRSA PCR SCREENING: MRSA by PCR: NEGATIVE

## 2013-12-31 LAB — I-STAT CG4 LACTIC ACID, ED: Lactic Acid, Venous: 1.78 mmol/L (ref 0.5–2.2)

## 2013-12-31 LAB — AMMONIA: Ammonia: 70 umol/L — ABNORMAL HIGH (ref 11–60)

## 2013-12-31 LAB — SAMPLE TO BLOOD BANK

## 2013-12-31 LAB — STREP PNEUMONIAE URINARY ANTIGEN: STREP PNEUMO URINARY ANTIGEN: NEGATIVE

## 2013-12-31 MED ORDER — RIFAXIMIN 550 MG PO TABS
550.0000 mg | ORAL_TABLET | Freq: Two times a day (BID) | ORAL | Status: DC
  Administered 2013-12-31 – 2014-01-01 (×2): 550 mg via ORAL
  Filled 2013-12-31 (×3): qty 1

## 2013-12-31 MED ORDER — LIDOCAINE HCL (CARDIAC) 20 MG/ML IV SOLN
INTRAVENOUS | Status: AC
  Filled 2013-12-31: qty 5

## 2013-12-31 MED ORDER — SODIUM CHLORIDE 0.9 % IV BOLUS (SEPSIS)
1000.0000 mL | Freq: Once | INTRAVENOUS | Status: AC
  Administered 2013-12-31: 1000 mL via INTRAVENOUS

## 2013-12-31 MED ORDER — FENTANYL CITRATE 0.05 MG/ML IJ SOLN
INTRAMUSCULAR | Status: AC
  Filled 2013-12-31: qty 2

## 2013-12-31 MED ORDER — ETOMIDATE 2 MG/ML IV SOLN
INTRAVENOUS | Status: AC
  Filled 2013-12-31: qty 20

## 2013-12-31 MED ORDER — CHLORHEXIDINE GLUCONATE 0.12 % MT SOLN
15.0000 mL | Freq: Two times a day (BID) | OROMUCOSAL | Status: DC
  Administered 2013-12-31: 15 mL via OROMUCOSAL
  Filled 2013-12-31: qty 15

## 2013-12-31 MED ORDER — PANTOPRAZOLE SODIUM 40 MG PO TBEC
40.0000 mg | DELAYED_RELEASE_TABLET | Freq: Every day | ORAL | Status: DC
  Administered 2013-12-31 – 2014-01-06 (×6): 40 mg via ORAL
  Filled 2013-12-31 (×7): qty 1

## 2013-12-31 MED ORDER — SODIUM CHLORIDE 0.9 % IV SOLN
500.0000 mg | Freq: Three times a day (TID) | INTRAVENOUS | Status: DC
  Administered 2013-12-31: 500 mg via INTRAVENOUS
  Filled 2013-12-31 (×2): qty 500

## 2013-12-31 MED ORDER — LACTULOSE 10 GM/15ML PO SOLN
20.0000 g | Freq: Four times a day (QID) | ORAL | Status: DC
  Administered 2014-01-01 – 2014-01-05 (×14): 20 g via ORAL
  Filled 2013-12-31 (×24): qty 30

## 2013-12-31 MED ORDER — RIFAXIMIN 550 MG PO TABS
550.0000 mg | ORAL_TABLET | Freq: Two times a day (BID) | ORAL | Status: DC
  Administered 2013-12-31: 550 mg
  Filled 2013-12-31 (×2): qty 1

## 2013-12-31 MED ORDER — SODIUM CHLORIDE 0.9 % IV SOLN
1250.0000 mg | Freq: Two times a day (BID) | INTRAVENOUS | Status: DC
  Administered 2013-12-31 – 2014-01-01 (×3): 1250 mg via INTRAVENOUS
  Filled 2013-12-31 (×4): qty 1250

## 2013-12-31 MED ORDER — LACTULOSE 10 GM/15ML PO SOLN
20.0000 g | Freq: Four times a day (QID) | ORAL | Status: DC
  Administered 2013-12-31 (×2): 20 g
  Filled 2013-12-31 (×5): qty 30

## 2013-12-31 MED ORDER — IPRATROPIUM BROMIDE 0.02 % IN SOLN
0.5000 mg | Freq: Once | RESPIRATORY_TRACT | Status: AC
  Administered 2013-12-31: 0.5 mg via RESPIRATORY_TRACT
  Filled 2013-12-31: qty 2.5

## 2013-12-31 MED ORDER — FENTANYL CITRATE 0.05 MG/ML IJ SOLN
100.0000 ug | INTRAMUSCULAR | Status: DC | PRN
  Administered 2013-12-31 (×2): 100 ug via INTRAVENOUS
  Filled 2013-12-31: qty 2

## 2013-12-31 MED ORDER — ALBUTEROL SULFATE (2.5 MG/3ML) 0.083% IN NEBU: 2.5000 mg | INHALATION_SOLUTION | RESPIRATORY_TRACT | Status: DC | PRN

## 2013-12-31 MED ORDER — PNEUMOCOCCAL VAC POLYVALENT 25 MCG/0.5ML IJ INJ
0.5000 mL | INJECTION | INTRAMUSCULAR | Status: AC
  Administered 2014-01-01: 0.5 mL via INTRAMUSCULAR
  Filled 2013-12-31: qty 0.5

## 2013-12-31 MED ORDER — OXYCODONE HCL 5 MG PO TABS
5.0000 mg | ORAL_TABLET | Freq: Three times a day (TID) | ORAL | Status: DC
  Administered 2013-12-31 – 2014-01-02 (×7): 5 mg via ORAL
  Filled 2013-12-31 (×8): qty 1

## 2013-12-31 MED ORDER — SODIUM CHLORIDE 0.9 % IV SOLN
INTRAVENOUS | Status: DC
  Administered 2014-01-01: 01:00:00 via INTRAVENOUS

## 2013-12-31 MED ORDER — SODIUM CHLORIDE 0.9 % IV SOLN
500.0000 mg | Freq: Four times a day (QID) | INTRAVENOUS | Status: DC
  Administered 2013-12-31 – 2014-01-01 (×5): 500 mg via INTRAVENOUS
  Filled 2013-12-31 (×7): qty 500

## 2013-12-31 MED ORDER — FENTANYL CITRATE 0.05 MG/ML IJ SOLN
100.0000 ug | INTRAMUSCULAR | Status: DC | PRN
  Administered 2013-12-31 (×2): 100 ug via INTRAVENOUS
  Filled 2013-12-31 (×2): qty 2

## 2013-12-31 MED ORDER — MIDAZOLAM HCL 2 MG/2ML IJ SOLN
INTRAMUSCULAR | Status: AC | PRN
  Administered 2013-12-31: 2 mg via INTRAVENOUS

## 2013-12-31 MED ORDER — ALBUTEROL SULFATE (2.5 MG/3ML) 0.083% IN NEBU
2.5000 mg | INHALATION_SOLUTION | Freq: Once | RESPIRATORY_TRACT | Status: AC
  Administered 2013-12-31: 2.5 mg via RESPIRATORY_TRACT
  Filled 2013-12-31: qty 3

## 2013-12-31 MED ORDER — ROCURONIUM BROMIDE 50 MG/5ML IV SOLN
INTRAVENOUS | Status: AC
  Filled 2013-12-31: qty 2

## 2013-12-31 MED ORDER — SODIUM CHLORIDE 0.9 % IV SOLN: 250.0000 mL | INTRAVENOUS | Status: DC | PRN

## 2013-12-31 MED ORDER — PANTOPRAZOLE SODIUM 40 MG IV SOLR
40.0000 mg | Freq: Every day | INTRAVENOUS | Status: DC
  Filled 2013-12-31: qty 40

## 2013-12-31 MED ORDER — FENTANYL CITRATE 0.05 MG/ML IJ SOLN
INTRAMUSCULAR | Status: AC | PRN
  Administered 2013-12-31: 100 ug via INTRAVENOUS

## 2013-12-31 MED ORDER — BIOTENE DRY MOUTH MT LIQD
15.0000 mL | Freq: Four times a day (QID) | OROMUCOSAL | Status: DC
  Administered 2013-12-31 (×3): 15 mL via OROMUCOSAL

## 2013-12-31 MED ORDER — SUCCINYLCHOLINE CHLORIDE 20 MG/ML IJ SOLN
INTRAMUSCULAR | Status: AC
  Filled 2013-12-31: qty 1

## 2013-12-31 MED ORDER — ETOMIDATE 2 MG/ML IV SOLN
INTRAVENOUS | Status: AC | PRN
  Administered 2013-12-31: 20 mg via INTRAVENOUS

## 2013-12-31 MED ORDER — MIDAZOLAM HCL 2 MG/2ML IJ SOLN
INTRAMUSCULAR | Status: AC
  Filled 2013-12-31: qty 2

## 2013-12-31 NOTE — Progress Notes (Signed)
Pt arrived from the ER with no family. Pt has watch and dentures that are in pt belonging bag. Will report off to on coming shift about pt belongings.

## 2013-12-31 NOTE — Procedures (Signed)
Intubation Procedure Note Barton FannyBilly E Brazzel [id number] 04/25/1951  Procedure: Intubation Indications: Airway protection and maintenance  Procedure Details Consent: no, emergency Time Out: Verified patient identification, verified procedure, site/side was marked, verified correct patient position, special equipment/implants available, medications/allergies/relevent history reviewed, required imaging and test results available.  Performed  Maximum sterile technique was used including gloves, gown, hand hygiene and mask.  MAC and 3    Evaluation Hemodynamic Status: BP stable throughout; O2 sats: stable throughout Patient's Current Condition: stable Complications: No apparent complications Patient did tolerate procedure well. Chest X-ray ordered to verify placement.  CXR: pending.   Nelda BucksFEINSTEIN,DANIEL J. 12/31/2013  Tons tan secretions, all over cords  Mcarthur Rossettianiel J. Tyson AliasFeinstein, MD, FACP Pgr: [phone] [facility]

## 2013-12-31 NOTE — Progress Notes (Signed)
ANTIBIOTIC CONSULT NOTE - INITIAL  Pharmacy Consult for Vancocin and Primaxin Indication: rule out pneumonia, rule out sepsis and cellulitis  Allergies  Allergen Reactions  . Ceftriaxone Sodium     REACTION: RASH, severe  . Codeine Rash    Patient Measurements: Weight: ~95kg Height: 5\' 8"  (172.7 cm) IBW/kg (Calculated) : 68.4  Vital Signs: Temp: 99.9 F (37.7 C) (03/19 0110) Temp src: Core (Comment) (03/19 0110) BP: 116/68 mmHg (03/19 0300) Pulse Rate: 77 (03/19 0300)  Labs:  Recent Labs  12/30/13 2329  WBC 6.1  HGB 7.9*  PLT 54*  CREATININE 1.18    Microbiology: No results found for this or any previous visit (from the past 720 hour(s)).  Medical History: Past Medical History  Diagnosis Date  . Bacteremia 02/2010    GBS bacteremia - tx with Abx x 28 days  . Septic arthritis     sternoclavicular  . Hepatitis C DX: 03/2010    VL (03/2010) 5,940,000  . Depression   . Anxiety   . History of Clostridium difficile infection   . Discitis   . Epidural abscess     H/O   . Elevated blood pressure   . Shoulder pain, bilateral   . Anemia   . Hepatic cirrhosis   . Hepatic encephalopathy 03/2010  . Allergic rhinitis     chronic  . Drug-induced constipation   . Osteomyelitis 05/2010, 07/2010    Diskitis and osteomyelitis at C6-C7   . Thrombocytopenia   . Alcohol abuse   . Atypical chest pain     [facility]/2010) - thought to be 2/2 GERD. Lexiscan Stress test (12/05/10) - [facility]. No ischemia seen, normal EF of 65%  . COPD (chronic obstructive pulmonary disease)   . Hypertension     Medications:  Scheduled:  . antiseptic oral rinse  15 mL Mouth Rinse QID  . chlorhexidine  15 mL Mouth Rinse BID  . lactulose  20 g Per Tube 4 times per day  . lidocaine (cardiac) 100 mg/255ml      . pantoprazole (PROTONIX) IV  40 mg Intravenous QHS  . rifaximin  550 mg Per Tube BID  . rocuronium      . succinylcholine       Infusions:  .  sodium chloride    . sodium chloride    . imipenem-cilastatin 500 mg (12/31/13 0230)    Assessment: 63yo male w/ complex PMH presents w/ lethargy and fever, found to be tachycardic w/ probable cellulitis and possible PNA, concern for sepsis and now intubated, to begin IV ABX.  Goal of Therapy:  Vancomycin trough level 15-20 mcg/ml  Plan:  Rec'd vanc 1g and Primaxin 500mg  IV in ED; will continue with vancomycin 1250mg  IV Q12H and Primaxin 500mg  IV Q6H and monitor CBC, Cx, levels prn.  Vernard GamblesVeronda Reyhan Moronta, PharmD, BCPS  12/31/2013,3:30 AM

## 2013-12-31 NOTE — Progress Notes (Signed)
Patient transported to 2M10 without any complications.

## 2013-12-31 NOTE — Clinical Documentation Improvement (Signed)
Please verify indication for intubation: respiratory failure versus airway protection Possible Clinical Conditions? Acute Respiratory Failure Acute Respiratory distress Other Condition  Supporting Information: Risk Factors: +/-aspiration pneumonia, COPD, Hepatic Encephalopathy,  Signs & Symptoms: Per progress note: "Tons tan secretions, all over cords"; RR: 11-30; labored respirations,  Diagnostics: glasgow coma scale: 6;  Treatment: intubation; ventilator;   Thank You, Amada Kingfisherebra J Hayes ,RN Clinical Documentation Specialist:  [facility]- Health Information Management

## 2013-12-31 NOTE — Progress Notes (Signed)
Utilization review completed. Jaishawn Witzke, RN, BSN. 

## 2013-12-31 NOTE — ED Notes (Signed)
Pt. Waking up .  Attempt to reach tube.

## 2013-12-31 NOTE — Progress Notes (Signed)
eLink Physician-Brief Progress Note Patient Name: Alex Henry DOB: 04/27/1951 MRN: [id number]  Date of Service  12/31/2013   HPI/Events of Note   Chronic narc user. Self-extubation today. He is asking for his home meds, having pain all over.   eICU Interventions  Start oxycodone at half his home dose.    Intervention Category Intermediate Interventions: Pain - evaluation and management  Alex Henry S. 12/31/2013, 4:24 PM

## 2013-12-31 NOTE — H&P (Signed)
PULMONARY / CRITICAL CARE MEDICINE   Name: Alex Henry MRN: [id number] DOB: January 31, 1951    ADMISSION DATE:  12/30/2013  REFERRING MD :  Norlene Campbell  PRIMARY SERVICE: PCCM   CHIEF COMPLAINT:  Acute encephalopathy and hypotension   BRIEF PATIENT DESCRIPTION:  63 year old male w/ multiple co-morbids: Hep C/ cirrhosis, multiple past infections and bouts of sepsis presented to ER 3/18 w/ 36hrs fever and progressive lethargy and r>L LE erythemia. In ER found to be hypotensive. PCCM admitted with a working diagnosis of sepsis source felt to be lower extremity cellulitis +/- aspiration pneumonia.  SIGNIFICANT EVENTS / STUDIES:  CT head 3/19: negative   LINES / TUBES: oett 3/19>>>  CULTURES: Blood cultures X 2 3/18>>> UC 3/18>>> Sputum 3/19>>>  ANTIBIOTICS: vanc 3/18>>> primaxin 3/18>>>  HISTORY OF PRESENT ILLNESS:   63 year old male w/ multiple co-morbids: Hep C/ cirrhosis, multiple past infections and bouts of sepsis presented to ER 3/18 w/ 36hrs fever and progressive lethargy and r>L LE erythemia. In ER found to be hypotensive, in spite of 3 L normal saline. On PCCM evaluation the patient's Glasgow Coma Scale was 6, he was barely arousable to noxious stimuli. He was intubated for airway protection. Admitted with a working diagnosis of sepsis source felt to be lower extremity cellulitis +/- aspiration pneumonia.  PAST MEDICAL HISTORY :  Past Medical History  Diagnosis Date  . Bacteremia 02/2010    GBS bacteremia - tx with Abx x 28 days  . Septic arthritis     sternoclavicular  . Hepatitis C DX: 03/2010    VL (03/2010) 5,940,000  . Depression   . Anxiety   . History of Clostridium difficile infection   . Discitis   . Epidural abscess     H/O   . Elevated blood pressure   . Shoulder pain, bilateral   . Anemia   . Hepatic cirrhosis   . Hepatic encephalopathy 03/2010  . Allergic rhinitis     chronic  . Drug-induced constipation   . Osteomyelitis 05/2010, 07/2010    Diskitis and  osteomyelitis at C6-C7   . Thrombocytopenia   . Alcohol abuse   . Atypical chest pain     Seen at [facility] (11/2010) - thought to be 2/2 GERD. Lexiscan Stress test (12/05/10) - at [facility]. No ischemia seen, normal EF of 65%  . COPD (chronic obstructive pulmonary disease)   . Hypertension    Past Surgical History  Procedure Laterality Date  . Esophagogastroduodenoscopy N/A 03/26/2013    Procedure: ESOPHAGOGASTRODUODENOSCOPY (EGD);  Surgeon: Petra Kuba, MD;  Location: [facility] ENDOSCOPY;  Service: Endoscopy;  Laterality: N/A;   Prior to Admission medications   Medication Sig Start Date End Date Taking? Authorizing Provider  furosemide (LASIX) 20 MG tablet Take 1 tablet (20 mg total) by mouth 2 (two) times daily. 03/28/13   Clydia Llano, MD  lactulose (CHRONULAC) 10 GM/15ML solution Take 30 mLs (20 g total) by mouth 2 (two) times daily. 03/28/13   Clydia Llano, MD  omeprazole (PRILOSEC) 40 MG capsule Take 40 mg by mouth daily.    Historical Provider, MD  Oxycodone HCl 10 MG TABS Take 10 mg by mouth 3 (three) times daily.    Historical Provider, MD  rifaximin (XIFAXAN) 550 MG TABS Take 1 tablet (550 mg total) by mouth 2 (two) times daily. 03/28/13   Clydia Llano, MD  spironolactone (ALDACTONE) 50 MG tablet Take 1 tablet (50 mg total) by mouth daily. 03/28/13   Mutaz  Arthor Captain, MD   Allergies  Allergen Reactions  . Ceftriaxone Sodium     REACTION: RASH, severe  . Codeine Rash    FAMILY HISTORY:  Family History  Problem Relation Age of Onset  . Heart disease Father   . Heart attack Father   . Cancer Maternal Aunt 73    colon cancer  . Arthritis Mother   . Arthritis Sister   . Anxiety disorder Sister   . Arthritis Sister   . Anxiety disorder Sister   . Cirrhosis Brother     and hepatitis c  . Cirrhosis Brother     and hepatitis c   SOCIAL HISTORY:  reports that he quit smoking about 10 years ago. His smokeless tobacco use includes Snuff. He reports that he does not  drink alcohol or use illicit drugs.  REVIEW OF SYSTEMS:  unable  SUBJECTIVE:  unresponsive VITAL SIGNS: Temp:  [99.9 F (37.7 C)-102.7 F (39.3 C)] 99.9 F (37.7 C) (03/19 0110) Pulse Rate:  [89-96] 89 (03/19 0100) Resp:  [20-30] 27 (03/19 0100) BP: (95-110)/(44-56) 96/44 mmHg (03/19 0100) SpO2:  [93 %-100 %] 95 % (03/19 0135) HEMODYNAMICS:   VENTILATOR SETTINGS:   INTAKE / OUTPUT: Intake/Output     03/18 0701 - 03/19 0700   I.V. 3000   Total Intake 3000   Net +3000         PHYSICAL EXAMINATION: General:  Chronically ill-appearing white male, currently minimally responsive even to noxious stimulus Neuro:  Glasgow Coma Scale is 6, opens eyes to noxious stimulus. HEENT:  Edentulous, mucous membranes are moist no jugular venous distention Cardiovascular:  Regular rate and rhythm Lungs:  Decreased, no accessory muscle use Abdomen:  Soft nontender no organomegaly Musculoskeletal:  intact Skin:  Area of erythema the right lower extremity circumferential, appears and sock line and begins just below the knee some areas of petechiae  LABS:  CBC  Recent Labs Lab 12/30/13 2329  WBC 6.1  HGB 7.9*  HCT 24.9*  PLT 54*   Coag's No results found for this basename: APTT, INR,  in the last 168 hours BMET  Recent Labs Lab 12/30/13 2329  NA 132*  K 3.7  CL 99  CO2 20  BUN 14  CREATININE 1.18  GLUCOSE 109*   Electrolytes  Recent Labs Lab 12/30/13 2329  CALCIUM 8.2*   Sepsis Markers  Recent Labs Lab 12/31/13 0004  LATICACIDVEN 1.78   ABG  Recent Labs Lab 12/31/13 0144  PHART 7.394  PCO2ART 32.5*  PO2ART 178.0*   Liver Enzymes  Recent Labs Lab 12/30/13 2329  AST 82*  ALT 49  ALKPHOS 22*  BILITOT 1.2  ALBUMIN 2.8*   Cardiac Enzymes No results found for this basename: TROPONINI, PROBNP,  in the last 168 hours Glucose No results found for this basename: GLUCAP,  in the last 168 hours  Imaging Dg Chest Port 1 View  12/30/2013   CLINICAL  DATA:  Fever and wheezing.  EXAM: PORTABLE CHEST - 1 VIEW  COMPARISON:  Single view of the chest 07/10/2013.  FINDINGS: Heart size and mediastinal contours are within normal limits. Both lungs are clear. Visualized skeletal structures are unremarkable.  IMPRESSION: Negative exam.   Electronically Signed   By: Drusilla Kanner M.D.   On: 12/30/2013 23:46    CXR: no infiltrates  ASSESSMENT / PLAN:  PULMONARY A: acute respiratory failure in the setting of inability to protect airway P:   Full ventilator support, 8 cc/kg PAD protocol, avoid benzo Followup  ABG and chest x-ray Daily assessment for weaning and extubation Treat hepatic enceph  CARDIOVASCULAR A:  Severe sepsis P:  Hold diuretics IV fluids with goal positive fluid balance Empiric antibiotics Map goal of 55 in the setting of cirrhosis, will hold off on central access for now Cortisol if drops BP  RENAL A:  No acute  P:   IV fluids Followup chemistry in a.m. Avoid hypotension  GASTROINTESTINAL A:   History of hepatitis C, and cirrhosis Gastroesophageal reflux disease P:   PPI for SUP Place gastric tube Continue home dose of lactulose and rifaximin   HEMATOLOGIC A:   Chronic anemia and thrombocytopenia Baseline platelets appear to be in the mid-40 range. He has no evidence of acute bleeding currently P:  Hold off on heparin, place plexipulse  Followup CBC Transfuse for hemoglobin less than 7  INFECTIOUS A:  Severe sepsis: suspect sources lower extremity cellulitis. Also consider secondary aspiration pneumonia - favor P:   See flow sheet above Panculture Empiric  Antibiotics pcxr follow up  ENDOCRINE A:  No acute P:   Avoid hypoglycemia in the setting of cirrhosis Check glucose every 4 hours and when necessary  NEUROLOGIC A:  Acute encephalopathy: likely sepsis, also consider hepatic (regardelss of ammonia level), pharmacological contributing factors  P:   Supportive care  Resume lactulose and  xifaxan  No role ammonia repeat  TODAY'S SUMMARY:  Intubated for airway protection. Suspect sources lower extremity cellulitis, however he does have some petechiae. Unclear how much of his encephalopathy is from sepsis and how much is hepatic in origin. Was admitted to the intensive care, place him on empiric antibiotics, obtain pan cultures, continue lactulose, and follow him clinically.  I have personally obtained a history, examined the patient, evaluated laboratory and imaging results, formulated the assessment and plan and placed orders. CRITICAL CARE: The patient is critically ill with multiple organ systems failure and requires high complexity decision making for assessment and support, frequent evaluation and titration of therapies, application of advanced monitoring technologies and extensive interpretation of multiple databases. Critical Care Time devoted to patient care services described in this note is 40 minutes.   Mcarthur Rossettianiel J. Tyson AliasFeinstein, MD, FACP Pgr: [phone] [facility]  Pulmonary and Critical Care Medicine [facility] Pager: [phone]  12/31/2013, 2:08 AM

## 2014-01-01 ENCOUNTER — Inpatient Hospital Stay (HOSPITAL_COMMUNITY): Payer: Medicaid Other

## 2014-01-01 ENCOUNTER — Encounter (HOSPITAL_COMMUNITY): Payer: Self-pay | Admitting: General Practice

## 2014-01-01 DIAGNOSIS — G8929 Other chronic pain: Secondary | ICD-10-CM

## 2014-01-01 LAB — URINE CULTURE

## 2014-01-01 LAB — PROCALCITONIN: Procalcitonin: 0.14 ng/mL

## 2014-01-01 MED ORDER — ALPRAZOLAM 0.25 MG PO TABS
0.2500 mg | ORAL_TABLET | Freq: Three times a day (TID) | ORAL | Status: DC | PRN
  Administered 2014-01-02 – 2014-01-03 (×3): 0.25 mg via ORAL
  Filled 2014-01-01 (×3): qty 1

## 2014-01-01 NOTE — Progress Notes (Signed)
IV team unable to place any IV's. Called 5W to update. IV team plans to come back to reaccess. Transferred to 5MW at this time.  Alex Henry eBayBianca

## 2014-01-01 NOTE — H&P (Addendum)
PULMONARY / CRITICAL CARE MEDICINE   Name: Barton FannyBilly E Ion MRN: [id number] DOB: 12/12/1950    ADMISSION DATE:  12/30/2013  REFERRING MD :  Norlene Campbelltter  PRIMARY SERVICE: PCCM   CHIEF COMPLAINT:  Acute encephalopathy and hypotension   BRIEF PATIENT DESCRIPTION:  63 year old male w/ multiple co-morbids: Hep C/ cirrhosis, multiple past infections and bouts of sepsis presented to ER 3/18 w/ 36hrs fever and progressive lethargy and r>L LE erythemia. In ER found to be hypotensive. PCCM admitted with a working diagnosis of sepsis source felt to be lower extremity cellulitis +/- aspiration pneumonia.  SIGNIFICANT EVENTS / STUDIES:  CT head 3/19: negative  3/20 self extubated am hours LINES / TUBES: oett 3/19>>>3/20 self  CULTURES: Blood cultures X 2 3/18>>> UC 3/18>>> Sputum 3/19>>>  ANTIBIOTICS: vanc 3/18>>>3/20 primaxin 3/18>>>3/20  SUBJECTIVE:  Awake, no distress  VITAL SIGNS: Temp:  [98.4 F (36.9 C)-101.8 F (38.8 C)] 98.4 F (36.9 C) (03/20 0839) Pulse Rate:  [74-100] 81 (03/20 0900) Resp:  [13-25] 18 (03/20 0900) BP: (92-152)/(41-99) 132/99 mmHg (03/20 0900) SpO2:  [93 %-100 %] 99 % (03/20 0900) FiO2 (%):  [30 %] 30 % (03/19 1100) Weight:  [97 kg (213 lb 13.5 oz)] 97 kg (213 lb 13.5 oz) (03/20 0700) HEMODYNAMICS:   VENTILATOR SETTINGS: Vent Mode:  [-]  FiO2 (%):  [30 %] 30 % INTAKE / OUTPUT: Intake/Output     03/19 0701 - 03/20 0700 03/20 0701 - 03/21 0700   P.O. 680    I.V. (mL/kg) 3000 (30.9)    Other 200    IV Piggyback 900 167   Total Intake(mL/kg) 4780 (49.3) 167 (1.7)   Urine (mL/kg/hr) 1900 (0.8)    Stool 3 (0)    Total Output 1903     Net +2877 +167        Urine Occurrence  1 x     PHYSICAL EXAMINATION: General: no distress Neuro: nonfocal, alert HEENT:obese neck PULM: CTA CV:  s1 s2 rrr ON:GEXBGI:soft, obese, nt Extremities: edema mild   LABS:  CBC  Recent Labs Lab 12/30/13 2329  WBC 6.1  HGB 7.9*  HCT 24.9*  PLT 54*   Coag's No results  found for this basename: APTT, INR,  in the last 168 hours BMET  Recent Labs Lab 12/30/13 2329  NA 132*  K 3.7  CL 99  CO2 20  BUN 14  CREATININE 1.18  GLUCOSE 109*   Electrolytes  Recent Labs Lab 12/30/13 2329  CALCIUM 8.2*   Sepsis Markers  Recent Labs Lab 12/31/13 0004 12/31/13 0358 01/01/14 0223  LATICACIDVEN 1.78  --   --   PROCALCITON  --  0.19 0.14   ABG  Recent Labs Lab 12/31/13 0144 12/31/13 0409  PHART 7.394 7.365  PCO2ART 32.5* 36.0  PO2ART 178.0* 398.0*   Liver Enzymes  Recent Labs Lab 12/30/13 2329  AST 82*  ALT 49  ALKPHOS 22*  BILITOT 1.2  ALBUMIN 2.8*   Cardiac Enzymes No results found for this basename: TROPONINI, PROBNP,  in the last 168 hours Glucose  Recent Labs Lab 12/31/13 0431 12/31/13 0821 12/31/13 1205 12/31/13 1610  GLUCAP 106* 98 92 82    Imaging Ct Head Wo Contrast  12/31/2013   CLINICAL DATA:  Lethargy.  EXAM: CT HEAD WITHOUT CONTRAST  TECHNIQUE: Contiguous axial images were obtained from the base of the skull through the vertex without intravenous contrast.  COMPARISON:  None.  FINDINGS: No mass. No hydrocephalus. No hemorrhage. Stable lucencies in  the left lenticular nucleus noted. These could represent tiny areas of bold lacunar infarction. These could represent prominent peri vascular spaces. 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 These appear stable. No acute bony abnormality. Mild mucosal thickening ethmoid and maxillary sinuses. Mastoids are clear.  IMPRESSION: Chronic ischemic change, no acute abnormality.   Electronically Signed   By: Maisie Fus  Register   On: 12/31/2013 02:26   Dg Chest Port 1 View  01/01/2014   CLINICAL DATA:  Evaluate for pneumonia.  Recent extubation.  EXAM: PORTABLE CHEST - 1 VIEW  COMPARISON:  DG CHEST 1V PORT dated 12/31/2013  FINDINGS: The endotracheal tube and nasogastric tube have been removed. Lung markings are mildly prominent but no evidence for edema or airspace  disease. Heart size is within normal limits. Atherosclerotic calcifications at the aortic arch. Deformity of the left clavicle suggests an old injury. No acute bone abnormality.  IMPRESSION: Removal of support apparatuses as described.  No focal chest disease.   Electronically Signed   By: Richarda Overlie M.D.   On: 01/01/2014 07:52   Dg Chest Portable 1 View  12/31/2013   CLINICAL DATA:  Intubation.  EXAM: PORTABLE CHEST - 1 VIEW  COMPARISON:  Single view of the chest 12/30/2013.  FINDINGS: A new endotracheal tube is in place with the tip in good position at the level of the clavicular heads. NG tube courses into the stomach and below the inferior margin of the film. Lungs appear clear with low lung volumes noted. Heart size is enlarged.  IMPRESSION: ET tube and NG tube in good position.  No acute finding.   Electronically Signed   By: Drusilla Kanner M.D.   On: 12/31/2013 03:21   Dg Chest Port 1 View  12/30/2013   CLINICAL DATA:  Fever and wheezing.  EXAM: PORTABLE CHEST - 1 VIEW  COMPARISON:  Single view of the chest 07/10/2013.  FINDINGS: Heart size and mediastinal contours are within normal limits. Both lungs are clear. Visualized skeletal structures are unremarkable.  IMPRESSION: Negative exam.   Electronically Signed   By: Drusilla Kanner M.D.   On: 12/30/2013 23:46    CXR: no infiltrates  ASSESSMENT / PLAN:  PULMONARY A: acute respiratory failure in the setting of inability to protect airway P:   No O2, ambulate Dc all abx IS  CARDIOVASCULAR A:  Severe sepsis P:  Hold diuretics dc tele Reduce fluids  RENAL A:  ARF, ATN? From sepsis?  P:   IV fluids to kvo Chem in am   GASTROINTESTINAL A:   History of hepatitis C, and cirrhosis Gastroesophageal reflux disease P:   Diet tolerated Continue home dose of lactulose and rifaximin   HEMATOLOGIC A:   Chronic anemia and thrombocytopenia Baseline platelets appear to be in the mid-40 range. He has no evidence of acute bleeding  currently P:  scd Cbc in am   INFECTIOUS A:  Severe sepsis NOT identified, good clinical status, pcxr neg, pct neg P:   Dc all abx and observe  ENDOCRINE A:  No acute P:   cbg  NEUROLOGIC A:  Acute encephalopathy: likely sepsis, also consider hepatic (regardelss of ammonia level), pharmacological contributing factors  I wonder if narcs , xanax caused neuro declines in setting liver P:   Supportive care  lactulose and dc xifaxan  No role ammonia repeat Slow with narcs,low low dose benzo  TODAY'S SUMMARY:  To floor, [facility]  Mcarthur Rossetti. Tyson Alias, MD, FACP Pgr: [phone] [facility]  Pulmonary and Critical Care Medicine [facility] Pager: [phone]  01/01/2014, 10:31 AM

## 2014-01-01 NOTE — Progress Notes (Signed)
Patient removed both PIV's from both arms. Pressure held and bandage applied. Education provided. Doctor notified. Anjulie Dipierro, Sherrilee Gillesandance Bianca, RN

## 2014-01-02 DIAGNOSIS — F329 Major depressive disorder, single episode, unspecified: Secondary | ICD-10-CM

## 2014-01-02 DIAGNOSIS — F3289 Other specified depressive episodes: Secondary | ICD-10-CM

## 2014-01-02 DIAGNOSIS — K7290 Hepatic failure, unspecified without coma: Secondary | ICD-10-CM

## 2014-01-02 DIAGNOSIS — K7682 Hepatic encephalopathy: Secondary | ICD-10-CM

## 2014-01-02 DIAGNOSIS — R509 Fever, unspecified: Secondary | ICD-10-CM

## 2014-01-02 DIAGNOSIS — K219 Gastro-esophageal reflux disease without esophagitis: Secondary | ICD-10-CM

## 2014-01-02 LAB — BASIC METABOLIC PANEL
BUN: 8 mg/dL (ref 6–23)
CALCIUM: 7.9 mg/dL — AB (ref 8.4–10.5)
CO2: 21 meq/L (ref 19–32)
Chloride: 109 mEq/L (ref 96–112)
Creatinine, Ser: 0.99 mg/dL (ref 0.50–1.35)
GFR calc Af Amer: >90 mL/min (ref >90)
GFR calc non Af Amer: 85 mL/min — ABNORMAL LOW (ref >90)
Glucose, Bld: 112 mg/dL — ABNORMAL HIGH (ref 70–99)
Potassium: 4.2 mEq/L (ref 3.7–5.3)
Sodium: 140 mEq/L (ref 137–147)

## 2014-01-02 LAB — PROCALCITONIN: Procalcitonin: <0.1 ng/mL

## 2014-01-02 MED ORDER — HALOPERIDOL LACTATE 5 MG/ML IJ SOLN
5.0000 mg | Freq: Once | INTRAMUSCULAR | Status: AC
  Administered 2014-01-03: 5 mg via INTRAVENOUS
  Filled 2014-01-02 (×2): qty 1

## 2014-01-02 MED ORDER — RIFAXIMIN 550 MG PO TABS
550.0000 mg | ORAL_TABLET | Freq: Two times a day (BID) | ORAL | Status: DC
  Administered 2014-01-02 – 2014-01-06 (×7): 550 mg via ORAL
  Filled 2014-01-02 (×10): qty 1

## 2014-01-02 MED ORDER — SPIRONOLACTONE 100 MG PO TABS
100.0000 mg | ORAL_TABLET | Freq: Every day | ORAL | Status: DC
  Administered 2014-01-02 – 2014-01-06 (×5): 100 mg via ORAL
  Filled 2014-01-02 (×5): qty 1

## 2014-01-02 MED ORDER — VANCOMYCIN HCL 10 G IV SOLR
1250.0000 mg | Freq: Two times a day (BID) | INTRAVENOUS | Status: AC
  Administered 2014-01-02 – 2014-01-05 (×6): 1250 mg via INTRAVENOUS
  Filled 2014-01-02 (×9): qty 1250

## 2014-01-02 MED ORDER — FUROSEMIDE 10 MG/ML IJ SOLN
40.0000 mg | Freq: Two times a day (BID) | INTRAMUSCULAR | Status: DC
  Administered 2014-01-02 – 2014-01-06 (×8): 40 mg via INTRAVENOUS
  Filled 2014-01-02 (×11): qty 4

## 2014-01-02 NOTE — Progress Notes (Signed)
MD notified that patient is acting more confused than day nurse reported, as well as, refusing lactulose and IV vancomycin.  He had chewing tobacco in his room that was confiscated and placed in his med drawer.  He is completely unaware of safety hazards of getting up by himself and refuses to cooperate and follow commands.  He would not even allow this nurse to flush his IV.  MD ordered haldol.  Will continue to monitor patient.

## 2014-01-02 NOTE — Progress Notes (Signed)
[facility] PROGRESS NOTE   Alex Henry ZOX:[id number] DOB: 05/04/1951 DOA: 12/30/2013 PCP: Galvin Proffer, MD  HPI/Subjective: Awake and alert, denies significant complaint. Has low-grade fever  Assessment/Plan: Principal Problem:   Acute encephalopathy Active Problems:   ANEMIA, HX OF   Sepsis   Thrombocytopenia, acquired due to cirrhosis   Fever   Cellulitis   Sepsis -Fever of 101.2, respiratory rate of 24 and heart rate of 101. -Likely secondary to right lower extremity cellulitis. -Blood pressure is stable. -Blood culture pending, urine looks clear as well as chest x-ray.  Left lower extremity cellulitis -Redness, tenderness and swelling. -Started patient on IV vancomycin.  Acute encephalopathy -In patient with history of cirrhosis, likely hepatic encephalopathy. -Patient is on lactulose, I will continue. Add Xifaxan. -Patient was on narcotics, gabapentin and benzos were all held, narcotics restarted at lower dose.  Hepatic cirrhosis -With abdominal ascites, hypoalbuminemia, hyperammonemia, thrombocytopenia and anemia. -Patient already on lactulose, Xifaxan added. -Reinstitute diuretics as patient has significant ascites and lower extremity edema.  Acute respiratory failure -Patient was intubated for airway protection, self extubated on 3/20. -Respiratory status is appropriate.  Code Status: Full code Family Communication: Plan discussed with the patient. Disposition Plan: Remains inpatient   Consultants:  None  Procedures:  Was intubated and mechanically ventilated, extubated himself on 3/20.  Antibiotics:  Vancomycin   Objective: Filed Vitals:   01/02/14 0812  BP:   Pulse:   Temp: 99.2 F (37.3 C)  Resp:     Intake/Output Summary (Last 24 hours) at 01/02/14 1020 Last data filed at 01/02/14 0939  Gross per 24 hour  Intake    684 ml  Output    400 ml  Net    284 ml   Filed Weights   12/31/13 0459 01/01/14 0700 01/02/14 0500   Weight: 97 kg (213 lb 13.5 oz) 97 kg (213 lb 13.5 oz) 93.895 kg (207 lb)    Exam: General: Alert and awake, oriented x3, not in any acute distress. HEENT: anicteric sclera, pupils reactive to light and accommodation, EOMI CVS: S1-S2 clear, no murmur rubs or gallops Chest: clear to auscultation bilaterally, no wheezing, rales or rhonchi Abdomen: soft nontender, nondistended, normal bowel sounds, no organomegaly Extremities: no cyanosis, clubbing or edema noted bilaterally Neuro: Cranial nerves II-XII intact, no focal neurological deficits  Data Reviewed: Basic Metabolic Panel:  Recent Labs Lab 12/30/13 2329 01/02/14 0626  NA 132* 140  K 3.7 4.2  CL 99 109  CO2 20 21  GLUCOSE 109* 112*  BUN 14 8  CREATININE 1.18 0.99  CALCIUM 8.2* 7.9*   Liver Function Tests:  Recent Labs Lab 12/30/13 2329  AST 82*  ALT 49  ALKPHOS 22*  BILITOT 1.2  PROT 6.9  ALBUMIN 2.8*   No results found for this basename: LIPASE, AMYLASE,  in the last 168 hours  Recent Labs Lab 12/30/13 2351  AMMONIA 70*   CBC:  Recent Labs Lab 12/30/13 2329  WBC 6.1  NEUTROABS 3.9  HGB 7.9*  HCT 24.9*  MCV 83.8  PLT 54*   Cardiac Enzymes: No results found for this basename: CKTOTAL, CKMB, CKMBINDEX, TROPONINI,  in the last 168 hours BNP (last 3 results) No results found for this basename: PROBNP,  in the last 8760 hours CBG:  Recent Labs Lab 12/31/13 0431 12/31/13 0821 12/31/13 1205 12/31/13 1610  GLUCAP 106* 98 92 82    Micro Recent Results (from the past 240 hour(s))  CULTURE, BLOOD (ROUTINE X 2)  Status: None   Collection Time    12/30/13 11:29 PM      Result Value Ref Range Status   Specimen Description BLOOD RIGHT FOREARM   Final   Special Requests BOTTLES DRAWN AEROBIC AND ANAEROBIC 10CC EACH   Final   Culture  Setup Time     Final   Value: 12/31/2013 08:13     Performed at [facility]   Culture     Final   Value:        BLOOD CULTURE RECEIVED NO GROWTH TO  DATE CULTURE WILL BE HELD FOR 5 DAYS BEFORE ISSUING A FINAL NEGATIVE REPORT     Performed at [facility]   Report Status PENDING   Incomplete  CULTURE, BLOOD (ROUTINE X 2)     Status: None   Collection Time    12/30/13 11:38 PM      Result Value Ref Range Status   Specimen Description BLOOD LEFT HAND   Final   Special Requests BOTTLES DRAWN AEROBIC ONLY 6CC   Final   Culture  Setup Time     Final   Value: 12/31/2013 08:13     Performed at [facility]   Culture     Final   Value:        BLOOD CULTURE RECEIVED NO GROWTH TO DATE CULTURE WILL BE HELD FOR 5 DAYS BEFORE ISSUING A FINAL NEGATIVE REPORT     Performed at [facility]   Report Status PENDING   Incomplete  URINE CULTURE     Status: None   Collection Time    12/30/13 11:45 PM      Result Value Ref Range Status   Specimen Description URINE, CATHETERIZED   Final   Special Requests Immunocompromised   Final   Culture  Setup Time     Final   Value: 12/31/2013 04:52     Performed at [facility]   Colony Count     Final   Value: NO GROWTH     Performed at [facility]   Culture     Final   Value: NO GROWTH     Performed at [facility]   Report Status 01/01/2014 FINAL   Final  MRSA PCR SCREENING     Status: None   Collection Time    12/31/13  4:33 AM      Result Value Ref Range Status   MRSA by PCR NEGATIVE  NEGATIVE Final   Comment:            The GeneXpert MRSA Assay (FDA     approved for NASAL specimens     only), is one component of a     comprehensive MRSA colonization     surveillance program. It is not     intended to diagnose MRSA     infection nor to guide or     monitor treatment for     MRSA infections.     Studies: Dg Chest Port 1 View  01/01/2014   CLINICAL DATA:  Evaluate for pneumonia.  Recent extubation.  EXAM: PORTABLE CHEST - 1 VIEW  COMPARISON:  DG CHEST 1V PORT dated 12/31/2013  FINDINGS: The endotracheal tube and nasogastric tube have been  removed. Lung markings are mildly prominent but no evidence for edema or airspace disease. Heart size is within normal limits. Atherosclerotic calcifications at the aortic arch. Deformity of the left clavicle suggests an old injury. No acute bone abnormality.  IMPRESSION:  Removal of support apparatuses as described.  No focal chest disease.   Electronically Signed   By: Richarda Overlie M.D.   On: 01/01/2014 07:52    Scheduled Meds: . lactulose  20 g Oral 4 times per day  . oxyCODONE  5 mg Oral TID  . pantoprazole  40 mg Oral Q1200   Continuous Infusions: . sodium chloride 125 mL/hr at 01/01/14 0050       Time spent: 35 minutes    [facility] A  [facility] Pager [phone] If 7PM-7AM, please contact night-coverage at [email or web address], password [facility] 01/02/2014, 10:20 AM  LOS: 3 days

## 2014-01-02 NOTE — Consult Note (Signed)
ANTIBIOTIC CONSULT NOTE - FOLLOW UP  Pharmacy Consult:  Vancomycin Indication:  Cellulitis  Dosing Weight: 85 kg  Afebrile  99.2 F (37.3 C) (Oral)    Component Value Date   WBC 6.1 12/30/2013    Labs:  Recent Labs  12/30/13 2329 01/02/14 0626  WBC 6.1  --   HGB 7.9*  --   PLT 54*  --   CREATININE 1.18 0.99    Estimated Creatinine Clearance: 84.9 ml/min (by C-G formula based on Cr of 0.99).  No results found for this basename: VANCOTROUGH,  in the last 72 hours   Microbiology: Recent Results (from the past 720 hour(s))  CULTURE, BLOOD (ROUTINE X 2)     Status: None   Collection Time    12/30/13 11:29 PM      Result Value Ref Range Status   Specimen Description BLOOD RIGHT FOREARM   Final   Special Requests BOTTLES DRAWN AEROBIC AND ANAEROBIC 10CC EACH   Final   Culture  Setup Time     Final   Value: 12/31/2013 08:13     Performed at [facility]   Culture     Final   Value:        BLOOD CULTURE RECEIVED NO GROWTH TO DATE CULTURE WILL BE HELD FOR 5 DAYS BEFORE ISSUING A FINAL NEGATIVE REPORT     Performed at [facility]   Report Status PENDING   Incomplete  CULTURE, BLOOD (ROUTINE X 2)     Status: None   Collection Time    12/30/13 11:38 PM      Result Value Ref Range Status   Specimen Description BLOOD LEFT HAND   Final   Special Requests BOTTLES DRAWN AEROBIC ONLY 6CC   Final   Culture  Setup Time     Final   Value: 12/31/2013 08:13     Performed at [facility]   Culture     Final   Value:        BLOOD CULTURE RECEIVED NO GROWTH TO DATE CULTURE WILL BE HELD FOR 5 DAYS BEFORE ISSUING A FINAL NEGATIVE REPORT     Performed at [facility]   Report Status PENDING   Incomplete  URINE CULTURE     Status: None   Collection Time    12/30/13 11:45 PM      Result Value Ref Range Status   Specimen Description URINE, CATHETERIZED   Final   Special Requests Immunocompromised   Final   Culture  Setup Time     Final   Value:  12/31/2013 04:52     Performed at [facility] Count     Final   Value: NO GROWTH     Performed at [facility]   Culture     Final   Value: NO GROWTH     Performed at [facility]   Report Status 01/01/2014 FINAL   Final  MRSA PCR SCREENING     Status: None   Collection Time    12/31/13  4:33 AM      Result Value Ref Range Status   MRSA by PCR NEGATIVE  NEGATIVE Final   Comment:            The GeneXpert MRSA Assay (FDA     approved for NASAL specimens     only), is one component of a     comprehensive MRSA colonization     surveillance program. It is  not     intended to diagnose MRSA     infection nor to guide or     monitor treatment for     MRSA infections.    Current Medication[s] Include:  Scheduled:  Scheduled:  . furosemide  40 mg Intravenous BID  . lactulose  20 g Oral 4 times per day  . oxyCODONE  5 mg Oral TID  . pantoprazole  40 mg Oral Q1200  . rifaximin  550 mg Oral BID  . spironolactone  100 mg Oral Daily   Antibiotic[s]: Anti-infectives   Start     Dose/Rate Route Frequency Ordered Stop   01/02/14 1045  rifaximin (XIFAXAN) tablet 550 mg     550 mg Oral 2 times daily 01/02/14 1034     12/31/13 2200  rifaximin (XIFAXAN) tablet 550 mg  Status:  Discontinued     550 mg Oral 2 times daily 12/31/13 1625 01/01/14 1039   12/31/13 1000  rifaximin (XIFAXAN) tablet 550 mg  Status:  Discontinued     550 mg Per Tube 2 times daily 12/31/13 0324 12/31/13 1625   12/31/13 0800  vancomycin (VANCOCIN) 1,250 mg in sodium chloride 0.9 % 250 mL IVPB  Status:  Discontinued     1,250 mg 166.7 mL/hr over 90 Minutes Intravenous Every 12 hours 12/31/13 0342 01/01/14 1033   12/31/13 0600  imipenem-cilastatin (PRIMAXIN) 500 mg in sodium chloride 0.9 % 100 mL IVPB  Status:  Discontinued     500 mg 200 mL/hr over 30 Minutes Intravenous 4 times per day 12/31/13 0342 01/01/14 1033   12/31/13 0200  imipenem-cilastatin (PRIMAXIN) 500 mg in sodium  chloride 0.9 % 100 mL IVPB  Status:  Discontinued     500 mg 200 mL/hr over 30 Minutes Intravenous 3 times per day 12/31/13 0144 12/31/13 0341   12/31/13 0000  vancomycin (VANCOCIN) IVPB 1000 mg/200 mL premix     1,000 mg 200 mL/hr over 60 Minutes Intravenous  Once 12/30/13 2350 12/31/13 0131      Assessment:  63 y/o Male previously on IV Vancomycin and Primaxin for Sepsis, cellulitis, and rule out PNA.  Antibiotics discontinued 01/01/14.  Patient has low grade fever, respiratory rate of 24, HR of 101.  This is likely due to LLE Cellulitis which is characterized by redness, swelling, and tenderness.  IV Vancomycin to be restarted for cellulitis coverage.  Scr 0.99, CrCl 85.  Patient weight 94 kg.  Goal of Therapy:   Vancomycin trough level 15-20 mcg/ml given septic symptoms.  Vancomycin trough level as indicated  Resolution of infection and normalization of patient's temperature  Plan:  1. Begin Vancomycin 1250 mg IV q 12 hours. 2. Monitor renal function, WBC, fever curve, any cultures/sensitivities, and clinical progression.  Roan Sawchuk, Elisha HeadlandEarle J, Pharm. D.   01/02/2014,10:55 AM

## 2014-01-03 DIAGNOSIS — E860 Dehydration: Secondary | ICD-10-CM

## 2014-01-03 MED ORDER — HALOPERIDOL LACTATE 5 MG/ML IJ SOLN
5.0000 mg | Freq: Three times a day (TID) | INTRAMUSCULAR | Status: DC | PRN
  Administered 2014-01-03: 5 mg via INTRAVENOUS
  Filled 2014-01-03: qty 1

## 2014-01-03 MED ORDER — NICOTINE 14 MG/24HR TD PT24
14.0000 mg | MEDICATED_PATCH | Freq: Every day | TRANSDERMAL | Status: DC
  Administered 2014-01-05 – 2014-01-06 (×2): 14 mg via TRANSDERMAL
  Filled 2014-01-03 (×4): qty 1

## 2014-01-03 MED ORDER — OXYCODONE HCL 5 MG PO TABS
5.0000 mg | ORAL_TABLET | Freq: Four times a day (QID) | ORAL | Status: DC | PRN
  Administered 2014-01-03: 5 mg via ORAL
  Filled 2014-01-03: qty 1

## 2014-01-03 NOTE — Progress Notes (Signed)
[facility] PROGRESS NOTE   Alex Henry VHQ:[id number] DOB: 18-Oct-1950 DOA: 12/30/2013 PCP: Galvin Proffer, MD  HPI/Subjective: Confused today, she is taking lactulose and vancomycin last night. Oriented with the RN at bedside, agree to take his lactulose.  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 -Likely secondary to right lower extremity cellulitis. -Blood pressure is stable. -Blood culture NGTD, urine looks clear as well as chest x-ray.  Left lower extremity cellulitis -Redness, tenderness and swelling. -Started patient on IV vancomycin.  Acute encephalopathy -In patient with history of cirrhosis, likely hepatic encephalopathy. -Patient is on lactulose, I will continue. Add Xifaxan. -Patient was on narcotics, gabapentin and benzos were all held, narcotics restarted at lower dose.  Hepatic cirrhosis -With abdominal ascites, hypoalbuminemia, hyperammonemia, thrombocytopenia and anemia. -Patient already on lactulose, Xifaxan added. -Reinstitute diuretics as patient has significant ascites and lower extremity edema.  Acute respiratory failure -Patient was intubated for airway protection, self extubated on 3/20. -Respiratory status is appropriate.  Code Status: Full code Family Communication: Plan discussed with the patient. Disposition Plan: Remains inpatient   Consultants:  None  Procedures:  Was intubated and mechanically ventilated, extubated himself on 3/20.  Antibiotics:  Vancomycin   Objective: Filed Vitals:   01/03/14 0538  BP: 121/75  Pulse: 92  Temp: 97.9 F (36.6 C)  Resp: 20    Intake/Output Summary (Last 24 hours) at 01/03/14 0931 Last data filed at 01/03/14 0837  Gross per 24 hour  Intake   1600 ml  Output   3850 ml  Net   -2250 ml   Filed Weights   01/01/14 0700 01/02/14 0500 01/03/14 0630  Weight: 97 kg (213 lb 13.5 oz) 93.895 kg (207 lb) 91.7 kg (202 lb 2.6 oz)    Exam: General: Alert and awake, oriented x3, not in any acute distress. HEENT: anicteric sclera, pupils reactive to light and accommodation, EOMI CVS: S1-S2 clear, no murmur rubs or gallops Chest: clear to auscultation bilaterally, no wheezing, rales or rhonchi Abdomen: soft nontender, nondistended, normal bowel sounds, no organomegaly Extremities: no cyanosis, clubbing or edema noted bilaterally Neuro: Cranial nerves II-XII intact, no focal neurological deficits  Data Reviewed: Basic Metabolic Panel:  Recent Labs Lab 12/30/13 2329 01/02/14 0626  NA 132* 140  K 3.7 4.2  CL 99 109  CO2 20 21  GLUCOSE 109* 112*  BUN 14 8  CREATININE 1.18 0.99  CALCIUM 8.2* 7.9*   Liver Function Tests:  Recent Labs Lab 12/30/13 2329  AST 82*  ALT 49  ALKPHOS 22*  BILITOT 1.2  PROT 6.9  ALBUMIN 2.8*   No results found for this basename: LIPASE, AMYLASE,  in the last 168 hours  Recent Labs Lab 12/30/13 2351  AMMONIA 70*   CBC:  Recent Labs Lab 12/30/13 2329  WBC 6.1  NEUTROABS 3.9  HGB 7.9*  HCT 24.9*  MCV 83.8  PLT 54*   Cardiac Enzymes: No results found for this basename: CKTOTAL, CKMB, CKMBINDEX, TROPONINI,  in the last 168 hours BNP (last 3 results) No results found for this basename: PROBNP,  in the last 8760 hours CBG:  Recent Labs Lab 12/31/13 0431 12/31/13 0821 12/31/13 1205 12/31/13 1610  GLUCAP 106* 98 92 82    Micro Recent Results (from the past 240 hour(s))  CULTURE, BLOOD (ROUTINE X 2)     Status: None   Collection Time    12/30/13 11:29 PM      Result Value Ref Range Status   Specimen Description BLOOD RIGHT FOREARM   Final   Special Requests BOTTLES DRAWN AEROBIC AND ANAEROBIC 10CC EACH   Final   Culture  Setup Time     Final   Value: 12/31/2013 08:13     Performed at [facility]    Culture     Final   Value:        BLOOD CULTURE RECEIVED NO GROWTH TO DATE CULTURE WILL BE HELD FOR 5 DAYS BEFORE ISSUING A FINAL NEGATIVE REPORT     Performed at [facility]   Report Status PENDING   Incomplete  CULTURE, BLOOD (ROUTINE X 2)     Status: None   Collection Time    12/30/13 11:38 PM      Result Value Ref Range Status   Specimen Description BLOOD LEFT HAND   Final   Special Requests BOTTLES DRAWN AEROBIC ONLY 6CC   Final   Culture  Setup Time     Final   Value: 12/31/2013 08:13     Performed at [facility]   Culture     Final   Value:        BLOOD CULTURE RECEIVED NO GROWTH TO DATE CULTURE WILL BE HELD FOR 5 DAYS BEFORE ISSUING A FINAL NEGATIVE REPORT     Performed at [facility]   Report Status PENDING   Incomplete  URINE CULTURE     Status: None   Collection Time    12/30/13 11:45 PM      Result Value Ref Range Status   Specimen Description URINE, CATHETERIZED   Final   Special Requests Immunocompromised   Final   Culture  Setup Time     Final   Value: 12/31/2013 04:52     Performed at [facility] Count     Final   Value: NO GROWTH     Performed at [facility]   Culture     Final   Value: NO GROWTH     Performed at [facility]   Report Status 01/01/2014 FINAL   Final  MRSA PCR SCREENING     Status: None   Collection Time    12/31/13  4:33 AM      Result Value Ref Range Status   MRSA by PCR NEGATIVE  NEGATIVE Final   Comment:            The GeneXpert MRSA Assay (FDA     approved for NASAL specimens     only), is one component of a     comprehensive MRSA colonization     surveillance program. It is not     intended to diagnose MRSA     infection nor to guide or     monitor treatment for     MRSA infections.     Studies: No results found.  Scheduled Meds: . furosemide  40 mg Intravenous BID  . lactulose  20 g Oral 4 times per day  . nicotine  14 mg Transdermal Daily  . oxyCODONE  5 mg  Oral TID  . pantoprazole  40 mg Oral Q1200  . rifaximin  550 mg Oral BID  . spironolactone  100 mg Oral Daily  . vancomycin (VANCOCIN) 1250 mg IVPB  1,250 mg Intravenous Q12H  Continuous Infusions:       Time spent: 35 minutes    [facility] DaytonELMAHI,Halena Mohar A  [facility] Pager [phone] If 7PM-7AM, please contact night-coverage at [email or web address], password [facility] 01/03/2014, 9:31 AM  LOS: 4 days

## 2014-01-03 NOTE — Progress Notes (Signed)
07:22 AM: Pt refusing lab sticks at this time. RN explained to patient the need for labs to be drawn and patient still refusing. MD notified and aware. Lab told to come back in an hour to try again.  11:31 AM Pt still refusing lab draws at this time. Patient also refusing medications. RN attempted to explain and reason with patient, but patient still refusing. Charge RN asked to see patient and provide further education and encouragement. Family contacted. Pt still refusing. Pt was agreeable with medical treatment yesterday. Pt's refusal is interfering with medical treatment. MD notified and orders received.

## 2014-01-04 DIAGNOSIS — R791 Abnormal coagulation profile: Secondary | ICD-10-CM

## 2014-01-04 DIAGNOSIS — IMO0002 Reserved for concepts with insufficient information to code with codable children: Secondary | ICD-10-CM

## 2014-01-04 LAB — CBC
HCT: 26.6 % — ABNORMAL LOW (ref 39.0–52.0)
HEMATOCRIT: 26.5 % — AB (ref 39.0–52.0)
HEMOGLOBIN: 8.4 g/dL — AB (ref 13.0–17.0)
HEMOGLOBIN: 8.5 g/dL — AB (ref 13.0–17.0)
MCH: 26.7 pg (ref 26.0–34.0)
MCH: 27 pg (ref 26.0–34.0)
MCHC: 31.6 g/dL (ref 30.0–36.0)
MCHC: 32.1 g/dL (ref 30.0–36.0)
MCV: 84.4 fL (ref 78.0–100.0)
MCV: 84.1 fL (ref 78.0–100.0)
PLATELETS: 59 10*3/uL — AB (ref 150–400)
Platelets: 66 10*3/uL — ABNORMAL LOW (ref 150–400)
RBC: 3.15 MIL/uL — ABNORMAL LOW (ref 4.22–5.81)
RBC: 3.15 MIL/uL — AB (ref 4.22–5.81)
RDW: 16.3 % — ABNORMAL HIGH (ref 11.5–15.5)
RDW: 16.3 % — ABNORMAL HIGH (ref 11.5–15.5)
WBC: 5.3 10*3/uL (ref 4.0–10.5)
WBC: 4.4 10*3/uL (ref 4.0–10.5)

## 2014-01-04 LAB — COMPREHENSIVE METABOLIC PANEL
ALBUMIN: 2.9 g/dL — AB (ref 3.5–5.2)
ALK PHOS: 21 U/L — AB (ref 39–117)
ALT: 52 U/L (ref 0–53)
AST: 74 U/L — AB (ref 0–37)
BUN: 9 mg/dL (ref 6–23)
CO2: 23 mEq/L (ref 19–32)
Calcium: 8.6 mg/dL (ref 8.4–10.5)
Chloride: 102 mEq/L (ref 96–112)
Creatinine, Ser: 0.83 mg/dL (ref 0.50–1.35)
GFR calc non Af Amer: >90 mL/min (ref >90)
GLUCOSE: 115 mg/dL — AB (ref 70–99)
POTASSIUM: 3.4 meq/L — AB (ref 3.7–5.3)
Sodium: 138 mEq/L (ref 137–147)
TOTAL PROTEIN: 7.2 g/dL (ref 6.0–8.3)
Total Bilirubin: 1.3 mg/dL — ABNORMAL HIGH (ref 0.3–1.2)

## 2014-01-04 LAB — AMMONIA: Ammonia: 26 umol/L (ref 11–60)

## 2014-01-04 MED ORDER — POTASSIUM CHLORIDE CRYS ER 20 MEQ PO TBCR
40.0000 meq | EXTENDED_RELEASE_TABLET | Freq: Four times a day (QID) | ORAL | Status: AC
  Administered 2014-01-04 (×2): 40 meq via ORAL
  Filled 2014-01-04 (×2): qty 2

## 2014-01-04 MED ORDER — HALOPERIDOL LACTATE 5 MG/ML IJ SOLN
5.0000 mg | Freq: Four times a day (QID) | INTRAMUSCULAR | Status: DC | PRN
Start: 2014-01-04 — End: 2014-01-05
  Administered 2014-01-04: 5 mg via INTRAVENOUS
  Filled 2014-01-04: qty 1

## 2014-01-04 NOTE — Progress Notes (Signed)
PROGRESS NOTE  THIERRY DOBOSZ VWU:[id number] DOB: October 12, 1951 DOA: 12/30/2013 PCP: Galvin Proffer, MD  HPI/Subjective: 63 yo male admitted to ED 3/18 for AMS secondary to hepatic encephalopathy with sepsis d/t cellulitis on LE. Pt is confused and significantly sedated this morning post Haldol for agitation. Pt has no complaints.    Assessment/Plan:  Sepsis  -Likely secondary to right lower extremity cellulitis.  -Afebrile, WBC WNL 3/23, No abdominal pain no suspicion for SBP.  -Blood pressure is stable.  -Blood culture NGTD, urine looks clear as well as chest x-ray.   Left lower extremity cellulitis  -Redness, tenderness and swelling, significantly improving.   -Started patient on IV vancomycin.   Acute encephalopathy  - Likely hepatic encephalopath d/t hx of cirrhosis  - Head ct negative.   - Hyperammonemia resolved 3/23 - Patient is on lactulose, I will continue. Xifaxan added.   - Patient was on narcotics, gabapentin and benzos were all held.  Hepatic cirrhosis  -With abdominal ascites, hypoalbuminemia, hyperammonemia, thrombocytopenia and anemia.  -Patient already on lactulose, Xifaxan added.  -Reinstitute diuretics as patient has significant ascites and lower extremity edema.  -3+ edema still present on diuretics on 3/23 - anemia improving: hgb 8.5 on  3/23  Acute respiratory failure  -Patient was intubated for airway protection, self extubated on 3/20.  -Respiratory status is stable   DVT Prophylaxis:  B/l foot pump   Code Status: full Family Communication: none at bedside Disposition Plan: remain inpatient   Procedures:  Was intubated & mechanically ventilated on 3/18 and self extubated 3/20  Antibiotics:  Vancomycin   Objective: Filed Vitals:   01/03/14 1404 01/03/14 2141 01/04/14 0344 01/04/14 0538  BP: 134/81 111/68 151/77 144/75  Pulse: 72 85 98 82  Temp: 98.1 F (36.7 C) 98.2 F (36.8 C) 98.4 F (36.9 C) 98 F (36.7 C)  TempSrc: Oral Oral Oral  Axillary  Resp: 20 20 18 18   Height:      Weight:    91 kg (200 lb 9.9 oz)  SpO2: 99% 98% 100% 98%    Intake/Output Summary (Last 24 hours) at 01/04/14 0959 Last data filed at 01/04/14 0700  Gross per 24 hour  Intake    500 ml  Output    400 ml  Net    100 ml   Filed Weights   01/02/14 0500 01/03/14 0630 01/04/14 0538  Weight: 93.895 kg (207 lb) 91.7 kg (202 lb 2.6 oz) 91 kg (200 lb 9.9 oz)    Exam: General: Well developed, well nourished, NAD, appears stated age  HEENT:  PERR, No pharyngeal erythema or exudates  Neck: Supple, no JVD, no masses  Cardiovascular: tachy, S1 S2 auscultated, no rubs, murmurs or gallops.  Mildly week pulses. EKG ordered Respiratory: Clear to auscultation bilaterally with equal chest rise  Abdomen: Soft, nontender, nondistended, + bowel sounds  Extremities: warm dry without cyanosis. B/l 3+ edema in LE Neuro: pt extremely sedated. Not oriented to person, time or place. Skin: R leg has significantly improving cellulitis- moderate warmth with mild erythema.   Psych: dulled, secondary to haldol.   Data Reviewed: Basic Metabolic Panel:  Recent Labs Lab 12/30/13 2329 01/02/14 0626 01/04/14 0410  NA 132* 140 138  K 3.7 4.2 3.4*  CL 99 109 102  CO2 20 21 23   GLUCOSE 109* 112* 115*  BUN 14 8 9   CREATININE 1.18 0.99 0.83  CALCIUM 8.2* 7.9* 8.6   Liver Function Tests:  Recent Labs Lab 12/30/13 2329 01/04/14  0410  AST 82* 74*  ALT 49 52  ALKPHOS 22* 21*  BILITOT 1.2 1.3*  PROT 6.9 7.2  ALBUMIN 2.8* 2.9*    Recent Labs Lab 12/30/13 2351 01/04/14 0410  AMMONIA 70* 26   CBC:  Recent Labs Lab 12/30/13 2329 01/04/14 0410 01/04/14 0916  WBC 6.1 5.3 4.4  NEUTROABS 3.9  --   --   HGB 7.9* 8.4* 8.5*  HCT 24.9* 26.6* 26.5*  MCV 83.8 84.4 84.1  PLT 54* 59* 66*   Cardiac Enzymes: No results found for this basename: CKTOTAL, CKMB, CKMBINDEX, TROPONINI,  in the last 168 hours BNP (last 3 results) No results found for this  basename: PROBNP,  in the last 8760 hours CBG:  Recent Labs Lab 12/31/13 0431 12/31/13 0821 12/31/13 1205 12/31/13 1610  GLUCAP 106* 98 92 82    Recent Results (from the past 240 hour(s))  CULTURE, BLOOD (ROUTINE X 2)     Status: None   Collection Time    12/30/13 11:29 PM      Result Value Ref Range Status   Specimen Description BLOOD RIGHT FOREARM   Final   Special Requests BOTTLES DRAWN AEROBIC AND ANAEROBIC 10CC EACH   Final   Culture  Setup Time     Final   Value: 12/31/2013 08:13     [facility]   Culture     Final   Value:        BLOOD CULTURE RECEIVED NO GROWTH TO DATE CULTURE WILL BE HELD FOR 5 DAYS BEFORE ISSUING A FINAL NEGATIVE REPORT     [facility]   Report Status PENDING   Incomplete  CULTURE, BLOOD (ROUTINE X 2)     Status: None   Collection Time    12/30/13 11:38 PM      Result Value Ref Range Status   Specimen Description BLOOD LEFT HAND   Final   Special Requests BOTTLES DRAWN AEROBIC ONLY 6CC   Final   Culture  Setup Time     Final   Value: 12/31/2013 08:13     [facility]   Culture     Final   Value:        BLOOD CULTURE RECEIVED NO GROWTH TO DATE CULTURE WILL BE HELD FOR 5 DAYS BEFORE ISSUING A FINAL NEGATIVE REPORT     [facility]   Report Status PENDING   Incomplete  URINE CULTURE     Status: None   Collection Time    12/30/13 11:45 PM      Result Value Ref Range Status   Specimen Description URINE, CATHETERIZED   Final   Special Requests Immunocompromised   Final   Culture  Setup Time     Final   Value: 12/31/2013 04:52     [facility]   Colony Count     Final   Value: NO GROWTH     [facility]   Culture     Final   Value: NO GROWTH     [facility]   Report Status 01/01/2014 FINAL   Final  MRSA PCR SCREENING     Status: None   Collection Time    12/31/13  4:33 AM      Result Value  Ref Range Status   MRSA by PCR NEGATIVE  NEGATIVE Final   Comment:  The GeneXpert MRSA Assay (FDA     approved for NASAL specimens     only), is one component of a     comprehensive MRSA colonization     surveillance program. It is not     intended to diagnose MRSA     infection nor to guide or     monitor treatment for     MRSA infections.     Studies: No results found.  Scheduled Meds: . furosemide  40 mg Intravenous BID  . lactulose  20 g Oral 4 times per day  . nicotine  14 mg Transdermal Daily  . pantoprazole  40 mg Oral Q1200  . rifaximin  550 mg Oral BID  . spironolactone  100 mg Oral Daily  . 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 If 7PM-7AM, please contact night-coverage at [email or web address], password [facility] 01/04/2014, 9:59 AM  LOS: 5 days    Addendum  Patient seen and examined, chart and data base reviewed.  I agree with the above assessment and plan.  For full details please see Mrs. Elease Hashimoto, PA-S note.  I reviewed the and amended the above note as needed.   Clint Lipps, MD [facility] Pager: [phone] 01/04/2014, 12:25 PM

## 2014-01-04 NOTE — Progress Notes (Signed)
Page by Maxine GlennN Nikki secondary to patient becoming highly agitated.patient able to state his name, cooperative with exam  General; sleepy, but arousable cooperates with exam, NAD Cardiac; regular rhythm and rate, negative murmurs rubs gallops Pulmonary; clear to auscultation bilateral   A/P  Agitation -Current vital signs, stable, patient's SpO2= 100% on room air. -Increase frequency of PRN Haldol to 5mg   q 6 -CMP pending -Ammonia level pending -Patient currently afebrile   Hepatic encephalopathy/acute encephalopathy -See agitation   Time spent 25 minute

## 2014-01-05 LAB — COMPREHENSIVE METABOLIC PANEL
ALK PHOS: 19 U/L — AB (ref 39–117)
ALT: 43 U/L (ref 0–53)
AST: 51 U/L — ABNORMAL HIGH (ref 0–37)
Albumin: 2.8 g/dL — ABNORMAL LOW (ref 3.5–5.2)
BILIRUBIN TOTAL: 1.1 mg/dL (ref 0.3–1.2)
BUN: 13 mg/dL (ref 6–23)
CHLORIDE: 105 meq/L (ref 96–112)
CO2: 25 mEq/L (ref 19–32)
CREATININE: 0.91 mg/dL (ref 0.50–1.35)
Calcium: 8.6 mg/dL (ref 8.4–10.5)
GFR calc Af Amer: >90 mL/min (ref >90)
GFR calc non Af Amer: 88 mL/min — ABNORMAL LOW (ref >90)
GLUCOSE: 102 mg/dL — AB (ref 70–99)
POTASSIUM: 4.2 meq/L (ref 3.7–5.3)
Sodium: 141 mEq/L (ref 137–147)
Total Protein: 7 g/dL (ref 6.0–8.3)

## 2014-01-05 LAB — URINALYSIS, ROUTINE W REFLEX MICROSCOPIC
BILIRUBIN URINE: NEGATIVE
Glucose, UA: NEGATIVE mg/dL
Hgb urine dipstick: NEGATIVE
KETONES UR: NEGATIVE mg/dL
LEUKOCYTES UA: NEGATIVE
NITRITE: NEGATIVE
PH: 6.5 (ref 5.0–8.0)
PROTEIN: NEGATIVE mg/dL
Specific Gravity, Urine: 1.012 (ref 1.005–1.030)
Urobilinogen, UA: 0.2 mg/dL (ref 0.0–1.0)

## 2014-01-05 LAB — PROTIME-INR
INR: 1.51 — ABNORMAL HIGH (ref 0.00–1.49)
Prothrombin Time: 17.8 seconds — ABNORMAL HIGH (ref 11.6–15.2)

## 2014-01-05 LAB — VANCOMYCIN, TROUGH: Vancomycin Tr: 18.2 ug/mL (ref 10.0–20.0)

## 2014-01-05 MED ORDER — VANCOMYCIN HCL IN DEXTROSE 1-5 GM/200ML-% IV SOLN
1000.0000 mg | Freq: Two times a day (BID) | INTRAVENOUS | Status: DC
  Administered 2014-01-05: 1000 mg via INTRAVENOUS
  Filled 2014-01-05 (×3): qty 200

## 2014-01-05 MED ORDER — LACTULOSE 10 GM/15ML PO SOLN
30.0000 g | Freq: Four times a day (QID) | ORAL | Status: DC
  Administered 2014-01-05 – 2014-01-06 (×5): 30 g via ORAL
  Filled 2014-01-05 (×8): qty 45

## 2014-01-05 NOTE — Progress Notes (Signed)
PROGRESS NOTE  Alex Henry WUJ:[id number] DOB: 01/16/1951 DOA: 12/30/2013 PCP: Galvin ProfferHAGUE, IMRAN P, MD  HPI/Subjective: 63 yo male admitted to ED 3/18 for AMS secondary to hepatic encephalopathy with sepsis d/t cellulitis on LE. Pt is confused and unable to answer questions regarding person/place/time although Haldol has not been given since last night. Will attempt to contact family member regarding pts baseline mental status. Pt has no complaints   Assessment/Plan: Sepsis  -Likely secondary to right lower extremity cellulitis.  -Afebrile, WBC WNL 3/24, No abdominal pain no suspicion for SBP.  -Blood pressure is stable.  -Blood culture NGTD, urine looks clear as well as chest x-ray.  - UA repeat ordered  Left lower extremity cellulitis  -Redness, tenderness and swelling, significantly improving.  - patient on IV vancomycin  Acute encephalopathy  - Likely hepatic encephalopath d/t hx of cirrhosis  - Head ct negative.  - Hyperammonemia resolved 3/23  - Patient is on lactulose, increased to 30mg  4x/day on 3/24 as mental status not improving. Xifaxan added.  - Patient was on narcotics, gabapentin and benzos were all held.   Hepatic cirrhosis  -With abdominal ascites, hypoalbuminemia, hyperammonemia, thrombocytopenia and anemia.  -Patient already on lactulose, Xifaxan added.  -Reinstitute diuretics as patient has significant ascites and lower extremity edema.  -2/3+ edema still present on diuretics on 3/24, improved from yesterday  - anemia improving: hgb 8.5 on 3/24  - ammonia 26umol/L on 3/23  Acute respiratory failure  -Patient was intubated for airway protection, self extubated on 3/20.  -Respiratory status is stable    DVT Prophylaxis:  Foot pump b/l  Code Status: full Family Communication: none at bedside; daughter called this morning Disposition Plan: remain inpatient   Procedures:  Intubation 3/18; self extubated  3/20  Antibiotics:  Vancomycin  Objective: Filed Vitals:   01/04/14 0538 01/04/14 1452 01/04/14 2114 01/05/14 0533  BP: 144/75 136/71 160/77 146/75  Pulse: 82 98 100 92  Temp: 98 F (36.7 C) 98.7 F (37.1 C) 97.5 F (36.4 C) 98.5 F (36.9 C)  TempSrc: Axillary Oral Oral Oral  Resp: 18 18 18 18   Height:      Weight: 91 kg (200 lb 9.9 oz)   87.3 kg (192 lb 7.4 oz)  SpO2: 98% 98% 95% 96%    Intake/Output Summary (Last 24 hours) at 01/05/14 1042 Last data filed at 01/05/14 0644  Gross per 24 hour  Intake    694 ml  Output    651 ml  Net     43 ml   Filed Weights   01/03/14 0630 01/04/14 0538 01/05/14 0533  Weight: 91.7 kg (202 lb 2.6 oz) 91 kg (200 lb 9.9 oz) 87.3 kg (192 lb 7.4 oz)    Exam: General: Well developed, well nourished, NAD, appears stated age  HEENT: PERR, No pharyngeal erythema or exudates  Neck: Supple, no JVD, no masses  Cardiovascular: RRR, S1 S2 auscultated, no rubs, murmurs or gallops. Mildly week pulses. EKG ordered  Respiratory: Clear to auscultation bilaterally with equal chest rise  Abdomen: Soft, nontender, nondistended, + bowel sounds  Extremities: warm dry without cyanosis. B/l 2/3+ edema in LE  Neuro:  Not oriented to person, time or place.  Skin: R leg has significantly improving cellulitis- moderate warmth with mild erythema.  Psych: confused, unable to answer questions, nods to some questions.    Data Reviewed: Basic Metabolic Panel:  Recent Labs Lab 12/30/13 2329 01/02/14 0626 01/04/14 0410 01/05/14 0824  NA 132* 140 138 141  K 3.7 4.2 3.4* 4.2  CL 99 109 102 105  CO2 20 21 23 25   GLUCOSE 109* 112* 115* 102*  BUN 14 8 9 13   CREATININE 1.18 0.99 0.83 0.91  CALCIUM 8.2* 7.9* 8.6 8.6   Liver Function Tests:  Recent Labs Lab 12/30/13 2329 01/04/14 0410 01/05/14 0824  AST 82* 74* 51*  ALT 49 52 43  ALKPHOS 22* 21* 19*  BILITOT 1.2 1.3* 1.1  PROT 6.9 7.2 7.0  ALBUMIN 2.8* 2.9* 2.8*    Recent Labs Lab 12/30/13 2351  01/04/14 0410  AMMONIA 70* 26   CBC:  Recent Labs Lab 12/30/13 2329 01/04/14 0410 01/04/14 0916  WBC 6.1 5.3 4.4  NEUTROABS 3.9  --   --   HGB 7.9* 8.4* 8.5*  HCT 24.9* 26.6* 26.5*  MCV 83.8 84.4 84.1  PLT 54* 59* 66*   CBG:  Recent Labs Lab 12/31/13 0431 12/31/13 0821 12/31/13 1205 12/31/13 1610  GLUCAP 106* 98 92 82    Recent Results (from the past 240 hour(s))  CULTURE, BLOOD (ROUTINE X 2)     Status: None   Collection Time    12/30/13 11:29 PM      Result Value Ref Range Status   Specimen Description BLOOD RIGHT FOREARM   Final   Special Requests BOTTLES DRAWN AEROBIC AND ANAEROBIC 10CC EACH   Final   Culture  Setup Time     Final   Value: 12/31/2013 08:13     Performed at [facility]   Culture     Final   Value:        BLOOD CULTURE RECEIVED NO GROWTH TO DATE CULTURE WILL BE HELD FOR 5 DAYS BEFORE ISSUING A FINAL NEGATIVE REPORT     Performed at [facility]   Report Status PENDING   Incomplete  CULTURE, BLOOD (ROUTINE X 2)     Status: None   Collection Time    12/30/13 11:38 PM      Result Value Ref Range Status   Specimen Description BLOOD LEFT HAND   Final   Special Requests BOTTLES DRAWN AEROBIC ONLY 6CC   Final   Culture  Setup Time     Final   Value: 12/31/2013 08:13     Performed at [facility]   Culture     Final   Value:        BLOOD CULTURE RECEIVED NO GROWTH TO DATE CULTURE WILL BE HELD FOR 5 DAYS BEFORE ISSUING A FINAL NEGATIVE REPORT     Performed at [facility]   Report Status PENDING   Incomplete  URINE CULTURE     Status: None   Collection Time    12/30/13 11:45 PM      Result Value Ref Range Status   Specimen Description URINE, CATHETERIZED   Final   Special Requests Immunocompromised   Final   Culture  Setup Time     Final   Value: 12/31/2013 04:52     Performed at [facility] Count     Final   Value: NO GROWTH     Performed at [facility]   Culture     Final    Value: NO GROWTH     Performed at [facility]   Report Status 01/01/2014 FINAL   Final  MRSA PCR SCREENING     Status: None   Collection Time    12/31/13  4:33 AM  Result Value Ref Range Status   MRSA by PCR NEGATIVE  NEGATIVE Final   Comment:            The GeneXpert MRSA Assay (FDA     approved for NASAL specimens     only), is one component of a     comprehensive MRSA colonization     surveillance program. It is not     intended to diagnose MRSA     infection nor to guide or     monitor treatment for     MRSA infections.     Studies: No results found.  Scheduled Meds: . furosemide  40 mg Intravenous BID  . lactulose  20 g Oral 4 times per day  . nicotine  14 mg Transdermal Daily  . pantoprazole  40 mg Oral Q1200  . rifaximin  550 mg Oral BID  . spironolactone  100 mg Oral Daily  . 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 If 7PM-7AM, please contact night-coverage at [email or web address], password [facility] 01/05/2014, 10:42 AM  LOS: 6 days    Addendum  Patient seen and examined, chart and data base reviewed.  I agree with the above assessment and plan.  For full details please see Mrs. Elease Hashimoto, PA-S note.  Sepsis secondary to left lower extremity cellulitis.  Acute encephalopathy likely secondary to hepatic encephalopathy plus medications including benzos and narcotics.   Clint Lipps, MD [facility] Pager: [phone] 01/05/2014, 1:11 PM

## 2014-01-05 NOTE — Progress Notes (Signed)
Patient daughter called stating he had a warrent out for his arrest for a failure to appear in court yesterday. Referred to Child psychotherapistsocial worker.

## 2014-01-05 NOTE — Clinical Social Work Placement (Signed)
Clinical Social Work Department CLINICAL SOCIAL WORK PLACEMENT NOTE 01/05/2014  Patient:  Alex FannyLYLES,Alex E  Account Number:  [id number] Admit date:  12/30/2013  Clinical Social Worker:  Lavell LusterJOSEPH BRYANT Amun Stemm, LCSWA  Date/time:  01/05/2014 11:50 AM  Clinical Social Work is seeking post-discharge placement for this patient at the following level of care:   SKILLED NURSING   (*CSW will update this form in Epic as items are completed)   01/05/2014  Patient/family provided with [facility] Department of Clinical Social Work's list of facilities offering this level of care within the geographic area requested by the patient (or if unable, by the patient's family).  01/05/2014  Patient/family informed of their freedom to choose among providers that offer the needed level of care, that participate in Medicare, Medicaid or managed care program needed by the patient, have an available bed and are willing to accept the patient.  01/05/2014  Patient/family informed of [facility]' ownership interest in [facility], as well as of the fact that they are under no obligation to receive care at this facility.  PASARR submitted to EDS on 01/05/2014 PASARR number received from EDS on   FL2 transmitted to all facilities in geographic area requested by pt/family on  01/05/2014 FL2 transmitted to all facilities within larger geographic area on   Patient informed that his/her managed care company has contracts with or will negotiate with  certain facilities, including the following:     Patient/family informed of bed offers received:   Patient chooses bed at  Physician recommends and patient chooses bed at    Patient to be transferred to  on   Patient to be transferred to facility by   The following physician request were entered in Epic:   Additional Comments:   Roddie McBryant Chakira Jachim, [place], [place], [phone]

## 2014-01-05 NOTE — Clinical Social Work Psychosocial (Signed)
Clinical Social Work Department BRIEF PSYCHOSOCIAL ASSESSMENT 01/05/2014  Patient:  Alex Henry,Alex Henry     Account Number:  [id number]     Admit date:  12/30/2013  Clinical Social Worker:  Lavell LusterAMPBELL,Damarcus Reggio BRYANT, LCSWA  Date/Time:  01/05/2014 11:32 AM  Referred by:  Physician  Date Referred:  01/05/2014 Referred for  SNF Placement   Other Referral:   Interview type:  Family Other interview type:   Patient very confused at this time. CSW contact patient's daughter Lurena JoinerRebecca to complete assessment.    PSYCHOSOCIAL DATA Living Status:  FRIEND(S) Admitted from facility:   Level of care:   Primary support name:  Lurena JoinerRebecca Primary support relationship to patient:  CHILD, ADULT Degree of support available:   Support is adequate.    CURRENT CONCERNS Current Concerns  Post-Acute Placement   Other Concerns:    SOCIAL WORK ASSESSMENT / PLAN CSW spoke with patient's daughter by phone as there is no family at bedside at this time. CSW inquired about patient's living situation. Lurena JoinerRebecca (daughter) states that the patient has been living with a "friend". Lurena JoinerRebecca states that her father told her that he no longer wants to live with this friend.    CSW explained that treatment team does not believe that the patient can live alone at this time. Lurena JoinerRebecca is agreeable to looking for short[place]. She states that the patient has missed a court date which was on 01/03/14 and he also missed one on 12/21/13. CSW informed Lurena JoinerRebecca that letter for court could be provided to her staating the patient's date of admission to the hospital and the time he spent here.    Lurena JoinerRebecca states that her father's mentation has not been well for quite some time. She specifically states that he couldn't even remember having the court date on 12/21/13. Lurena JoinerRebecca states that the patient has another daughter that lives in [place], [place]. This daughter Shawna Orleans(Melanie) is stating that she would like [place] her but would like to get his court issues worked out prior to this. CSW explained SNF search/placement process. CSW will follow up with bed offers when available.   Assessment/plan status:  Psychosocial Support/Ongoing Assessment of Needs Other assessment/ plan:   Complete FL2, Fax, PASRR.   Information/referral to community resources:   CSW contact information, SNF list, Court letter given to daughter Lurena JoinerRebecca.    PATIENT'S/FAMILY'S RESPONSE TO PLAN OF CARE: Patient's daughter's Shawna OrleansMelanie and Lurena JoinerRebecca are both agreeable to SNF placement for patient, at least until he can stay with [place]. Shawna OrleansMelanie seems to feel guilty about her father going to a facility for care, but hopes that this will only be temporary. Lurena JoinerRebecca was very pleasant, appropriate, and appreciative of CSW contact.      Roddie McBryant Orvilla Truett, [place]CSWA, [place], [phone]

## 2014-01-05 NOTE — Progress Notes (Signed)
ANTIBIOTIC CONSULT NOTE - FOLLOW UP  Pharmacy Consult for Vancomycin Indication: Cellulitis  Allergies  Allergen Reactions  . Ceftriaxone Sodium     REACTION: RASH, severe  . Codeine Rash    Patient Measurements: Height: 5\' 8"  (172.7 cm) Weight: 192 lb 7.4 oz (87.3 kg) IBW/kg (Calculated) : 68.4 Adjusted Body Weight:   Vital Signs: Temp: 98.5 F (36.9 C) (03/24 0533) Temp src: Oral (03/24 0533) BP: 146/75 mmHg (03/24 0533) Pulse Rate: 92 (03/24 0533) Intake/Output from previous day: 03/23 0701 - 03/24 0700 In: 694 [P.O.:444; IV Piggyback:250] Out: 651 [Urine:650; Stool:1] Intake/Output from this shift:    Labs:  Recent Labs  01/04/14 0410 01/04/14 0916 01/05/14 0824  WBC 5.3 4.4  --   HGB 8.4* 8.5*  --   PLT 59* 66*  --   CREATININE 0.83  --  0.91   Estimated Creatinine Clearance: 89.3 ml/min (by C-G formula based on Cr of 0.91).  Recent Labs  01/05/14 0824  VANCOTROUGH 18.2     Microbiology: Recent Results (from the past 720 hour(s))  CULTURE, BLOOD (ROUTINE X 2)     Status: None   Collection Time    12/30/13 11:29 PM      Result Value Ref Range Status   Specimen Description BLOOD RIGHT FOREARM   Final   Special Requests BOTTLES DRAWN AEROBIC AND ANAEROBIC 10CC EACH   Final   Culture  Setup Time     Final   Value: 12/31/2013 08:13     Performed at [facility]   Culture     Final   Value:        BLOOD CULTURE RECEIVED NO GROWTH TO DATE CULTURE WILL BE HELD FOR 5 DAYS BEFORE ISSUING A FINAL NEGATIVE REPORT     Performed at [facility]   Report Status PENDING   Incomplete  CULTURE, BLOOD (ROUTINE X 2)     Status: None   Collection Time    12/30/13 11:38 PM      Result Value Ref Range Status   Specimen Description BLOOD LEFT HAND   Final   Special Requests BOTTLES DRAWN AEROBIC ONLY 6CC   Final   Culture  Setup Time     Final   Value: 12/31/2013 08:13     Performed at [facility]   Culture     Final   Value:         BLOOD CULTURE RECEIVED NO GROWTH TO DATE CULTURE WILL BE HELD FOR 5 DAYS BEFORE ISSUING A FINAL NEGATIVE REPORT     Performed at [facility]   Report Status PENDING   Incomplete  URINE CULTURE     Status: None   Collection Time    12/30/13 11:45 PM      Result Value Ref Range Status   Specimen Description URINE, CATHETERIZED   Final   Special Requests Immunocompromised   Final   Culture  Setup Time     Final   Value: 12/31/2013 04:52     Performed at [facility] Count     Final   Value: NO GROWTH     Performed at [facility]   Culture     Final   Value: NO GROWTH     Performed at [facility]   Report Status 01/01/2014 FINAL   Final  MRSA PCR SCREENING     Status: None   Collection Time    12/31/13  4:33 AM  Result Value Ref Range Status   MRSA by PCR NEGATIVE  NEGATIVE Final   Comment:            The GeneXpert MRSA Assay (FDA     approved for NASAL specimens     only), is one component of a     comprehensive MRSA colonization     surveillance program. It is not     intended to diagnose MRSA     infection nor to guide or     monitor treatment for     MRSA infections.    Anti-infectives   Start     Dose/Rate Route Frequency Ordered Stop   01/02/14 1115  vancomycin (VANCOCIN) 1,250 mg in sodium chloride 0.9 % 250 mL IVPB     1,250 mg 166.7 mL/hr over 90 Minutes Intravenous Every 12 hours 01/02/14 1109     01/02/14 1045  rifaximin (XIFAXAN) tablet 550 mg     550 mg Oral 2 times daily 01/02/14 1034     12/31/13 2200  rifaximin (XIFAXAN) tablet 550 mg  Status:  Discontinued     550 mg Oral 2 times daily 12/31/13 1625 01/01/14 1039   12/31/13 1000  rifaximin (XIFAXAN) tablet 550 mg  Status:  Discontinued     550 mg Per Tube 2 times daily 12/31/13 0324 12/31/13 1625   12/31/13 0800  vancomycin (VANCOCIN) 1,250 mg in sodium chloride 0.9 % 250 mL IVPB  Status:  Discontinued     1,250 mg 166.7 mL/hr over 90 Minutes Intravenous  Every 12 hours 12/31/13 0342 01/01/14 1033   12/31/13 0600  imipenem-cilastatin (PRIMAXIN) 500 mg in sodium chloride 0.9 % 100 mL IVPB  Status:  Discontinued     500 mg 200 mL/hr over 30 Minutes Intravenous 4 times per day 12/31/13 0342 01/01/14 1033   12/31/13 0200  imipenem-cilastatin (PRIMAXIN) 500 mg in sodium chloride 0.9 % 100 mL IVPB  Status:  Discontinued     500 mg 200 mL/hr over 30 Minutes Intravenous 3 times per day 12/31/13 0144 12/31/13 0341   12/31/13 0000  vancomycin (VANCOCIN) IVPB 1000 mg/200 mL premix     1,000 mg 200 mL/hr over 60 Minutes Intravenous  Once 12/30/13 2350 12/31/13 0131      Assessment: 62yom on Vancomycin Day 6 for LLE cellulitis. Patient is currently afebrile, WBC wnl, cultures no growth and cellulitis improving per MD. Vancomycin trough checked this AM was 18.2 mcg/ml which is within the more aggressive Vancomycin goal range but just above the conservative goal range for cellulitis of 10-15 mcg/ml. Patient's SCr has remained stable during admission. Per Dr. Arthor CaptainElmahi, continue IV Vancomycin for now as patient having difficulty with PO meds.   Goal of Therapy:  Vancomycin trough level 10-15 mcg/ml  Plan:  1. Decrease Vancomycin to 1g IV q12h 2. Monitor renal function, cultures, clinical course and plans for narrowing antibiotics vs LO  Cleon DewDulaney, [place] [phone] 01/05/2014,10:51 AM

## 2014-01-05 NOTE — Clinical Social Work Note (Signed)
CSW let court letter, SNF list, ALF list, and CSW contact information in patient's room for daughter Glennon MacRebecca.,  Bryant Neda Willenbring, LCSWA, LCASA, [phone]

## 2014-01-05 NOTE — Care Management Note (Signed)
    Page 1 of 1   01/07/2014     10:56:36 AM   CARE MANAGEMENT NOTE 01/07/2014  Patient:  Alex Henry,Alex E   Account Number:  [id number]  Date Initiated:  01/05/2014  Documentation initiated by:  Letha CapeAYLOR,Oni Dietzman  Subjective/Objective Assessment:   dx hep encephalopathy, cellulits  admit- lives with friend.     Action/Plan:   Anticipated DC Date:  01/06/2014   Anticipated DC Plan:  SKILLED NURSING FACILITY  In-house referral  Clinical Social Worker      DC Planning Services  CM consult      Choice offered to / List presented to:             Status of service:  Completed, signed off Medicare Important Message given?   (If response is "NO", the following Medicare IM given date fields will be blank) Date Medicare IM given:   Date Additional Medicare IM given:    Discharge Disposition:  SKILLED NURSING FACILITY  Per UR Regulation:  Reviewed for med. necessity/level of care/duration of stay  If discussed at Long Length of Stay Meetings, dates discussed:   01/05/2014    Comments:  01/05/14 1612 Letha Capeeborah Verneta Hamidi RN, BSN [phone] patient is more confuse, does not know where he is ,can not answer questions.  Patient will need snf, CSW following for placement.

## 2014-01-06 DIAGNOSIS — A419 Sepsis, unspecified organism: Principal | ICD-10-CM

## 2014-01-06 LAB — CBC WITH DIFFERENTIAL/PLATELET
BASOS ABS: 0 10*3/uL (ref 0.0–0.1)
BASOS PCT: 0 % (ref 0–1)
Eosinophils Absolute: 0 10*3/uL (ref 0.0–0.7)
Eosinophils Relative: 0 % (ref 0–5)
HEMATOCRIT: 30.3 % — AB (ref 39.0–52.0)
HEMOGLOBIN: 9.6 g/dL — AB (ref 13.0–17.0)
Lymphocytes Relative: 15 % (ref 12–46)
Lymphs Abs: 1.1 10*3/uL (ref 0.7–4.0)
MCH: 26.6 pg (ref 26.0–34.0)
MCHC: 31.7 g/dL (ref 30.0–36.0)
MCV: 83.9 fL (ref 78.0–100.0)
MONO ABS: 0.5 10*3/uL (ref 0.1–1.0)
Monocytes Relative: 7 % (ref 3–12)
NEUTROS ABS: 5.6 10*3/uL (ref 1.7–7.7)
Neutrophils Relative %: 77 % (ref 43–77)
Platelets: 105 10*3/uL — ABNORMAL LOW (ref 150–400)
RBC: 3.61 MIL/uL — ABNORMAL LOW (ref 4.22–5.81)
RDW: 16.7 % — AB (ref 11.5–15.5)
WBC: 7.3 10*3/uL (ref 4.0–10.5)

## 2014-01-06 LAB — CULTURE, BLOOD (ROUTINE X 2)

## 2014-01-06 MED ORDER — OXYCODONE HCL 10 MG PO TABS: 5.0000 mg | ORAL_TABLET | Freq: Four times a day (QID) | ORAL | Status: DC | PRN

## 2014-01-06 MED ORDER — SPIRONOLACTONE 100 MG PO TABS: 100.0000 mg | ORAL_TABLET | Freq: Every day | ORAL | Status: AC

## 2014-01-06 MED ORDER — DOXYCYCLINE HYCLATE 100 MG PO TABS: 100.0000 mg | ORAL_TABLET | Freq: Two times a day (BID) | ORAL | Status: AC

## 2014-01-06 MED ORDER — RIFAXIMIN 550 MG PO TABS: 550.0000 mg | ORAL_TABLET | Freq: Two times a day (BID) | ORAL | Status: AC

## 2014-01-06 MED ORDER — LACTULOSE 10 GM/15ML PO SOLN: 30.0000 g | Freq: Four times a day (QID) | ORAL | Status: AC

## 2014-01-06 MED ORDER — ALPRAZOLAM 0.5 MG PO TABS: 0.5000 mg | ORAL_TABLET | Freq: Three times a day (TID) | ORAL | Status: DC | PRN

## 2014-01-06 MED ORDER — PANTOPRAZOLE SODIUM 40 MG PO TBEC: 40.0000 mg | DELAYED_RELEASE_TABLET | Freq: Every day | ORAL | Status: AC

## 2014-01-06 MED ORDER — NICOTINE 14 MG/24HR TD PT24: 14.0000 mg | MEDICATED_PATCH | Freq: Every day | TRANSDERMAL | Status: AC

## 2014-01-06 MED ORDER — DOXYCYCLINE HYCLATE 100 MG PO TABS
100.0000 mg | ORAL_TABLET | Freq: Two times a day (BID) | ORAL | Status: DC
  Administered 2014-01-06: 100 mg via ORAL
  Filled 2014-01-06 (×2): qty 1

## 2014-01-06 MED ORDER — FUROSEMIDE 40 MG PO TABS: 40.0000 mg | ORAL_TABLET | Freq: Every day | ORAL | Status: AC

## 2014-01-06 NOTE — Progress Notes (Signed)
Patient was discharged to nursing home by MD order; discharged instructions and prescriptions sent with patient to facility; IV DIC; skin intact; facility called and given report; [facility].

## 2014-01-06 NOTE — Clinical Social Work Placement (Signed)
Clinical Social Work Department CLINICAL SOCIAL WORK PLACEMENT NOTE 01/06/2014  Patient:  Alex Henry,Alex Henry  Account Number:  [id number] Admit date:  12/30/2013  Clinical Social Worker:  Lavell LusterJOSEPH BRYANT [name], LCSWA  Date/time:  01/05/2014 11:50 AM  Clinical Social Work is seeking post-discharge placement for this patient at the following level of care:   SKILLED NURSING   (*CSW will update this form in Epic as items are completed)   01/05/2014  Patient/family provided with [facility] Department of Clinical Social Work's list of facilities offering this level of care within the geographic area requested by the patient (or if unable, by the patient's family).  01/05/2014  Patient/family informed of their freedom to choose among providers that offer the needed level of care, that participate in Medicare, Medicaid or managed care program needed by the patient, have an available bed and are willing to accept the patient.  01/05/2014  Patient/family informed of [facility] ownership interest in [facility], as well as of the fact that they are under no obligation to receive care at this facility.  PASARR submitted to EDS on 01/05/2014 PASARR number received from EDS on   FL2 transmitted to all facilities in geographic area requested by pt/family on  01/05/2014 FL2 transmitted to all facilities within larger geographic area on   Patient informed that his/her managed care company has contracts with or will negotiate with  certain facilities, including the following:     Patient/family informed of bed offers received:  01/05/2014 [facility] [place] Physician recommends and patient chooses bed at    [facility] [place] on  01/06/2014 Patient to be transferred to facility by Ambulance  The following physician request were entered in Epic:   Additional Comments: Per MD patient ready [facility]. Patient, daughter, RN, and facility notified of DC. RN given number for report. DC packet on chart. Ambulance transport requested for patient. Patient and daughter aware that patient will have to stay at facility for 30 days due to having Medicaid. Both understand and agree to this plan. CSW signing off.   Roddie McBryant Martese Vanatta, [place]CSWA, [place], [phone]

## 2014-01-06 NOTE — Discharge Summary (Signed)
PATIENT DETAILS Name: Alex Henry Age: 63 y.o. Sex: male Date of Birth: 12/29/1950 MRN: [id number]. Admit Date: 12/30/2013 Admitting Physician: Nelda Bucksaniel J Feinstein, MD QIO:[id number]:HAGUE, Myrene GalasIMRAN P, MD  Recommendations for Outpatient Follow-up:  1. Minimize narcotics, benzodiazepines and other sedating medications. 2. Needs followup with gastroenterology for further optimization of liver cirrhosis medications. 3. Please check CBC and chemistries in one week-patient on diuretics.  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 Bacteremia 02/2010    GBS bacteremia - tx with Abx x 28 days  . Septic arthritis     sternoclavicular  . Hepatitis C DX: 03/2010    VL (03/2010) 5,940,000  . Depression   . Anxiety   . History of Clostridium difficile infection   . Discitis   . Epidural abscess     H/O   . Elevated blood pressure   . Shoulder pain, bilateral   . Anemia   . Hepatic cirrhosis   . Hepatic encephalopathy 03/2010  . Allergic rhinitis     chronic  . Drug-induced constipation   . Osteomyelitis 05/2010, 07/2010    Diskitis and osteomyelitis at C6-C7   . Thrombocytopenia   . Alcohol abuse   . Atypical chest pain     [facility]) - thought to be 2/2 GERD. Lexiscan Stress test (12/05/10) - [facility]. No ischemia seen, normal EF of 65%  . COPD (chronic obstructive pulmonary disease)   . Hypertension     DISCHARGE MEDICATIONS:   Medication List    STOP taking these medications       gabapentin 800 MG tablet  Commonly known as:  NEURONTIN     omeprazole 40 MG capsule  Commonly known as:  PRILOSEC      TAKE these medications       ALPRAZolam 0.5 MG tablet  Commonly known as:  XANAX  Take 1 tablet (0.5 mg total) by mouth 3 (three)  times daily as needed for anxiety.     doxycycline 100 MG tablet  Commonly known as:  VIBRA-TABS  Take 1 tablet (100 mg total) by mouth every 12 (twelve) hours. 3 days from 01/06/14 and then stop     furosemide 40 MG tablet  Commonly known as:  LASIX  Take 1 tablet (40 mg total) by mouth daily.     lactulose 10 GM/15ML solution  Commonly known as:  CHRONULAC  Take 45 mLs (30 g total) by mouth every 6 (six) hours.     nicotine 14 mg/24hr patch  Commonly known as:  NICODERM CQ - dosed in mg/24 hours  Place 1 patch (14 mg total) onto the skin daily.     Oxycodone HCl 10 MG Tabs  Take 0.5 tablets (5 mg total) by mouth 4 (four) times daily as needed.     pantoprazole 40 MG tablet  Commonly known as:  PROTONIX  Take 1 tablet (40 mg total) by mouth daily.     rifaximin 550 MG Tabs tablet  Commonly known as:  XIFAXAN  Take 1 tablet (550 mg total) by mouth 2 (two) times daily.     spironolactone 100 MG tablet  Commonly known as:  ALDACTONE  Take 1 tablet (100 mg total) by mouth daily.  ALLERGIES:   Allergies  Allergen Reactions  . Ceftriaxone Sodium     REACTION: RASH, severe  . Codeine Rash    BRIEF HPI:  See H&P, Labs, Consult and Test reports for all details in brief, 63 year old male w/ multiple co-morbids: Hep C/ cirrhosis, multiple past infections and bouts of sepsis presented to ER 3/18 w/ 36hrs fever and progressive lethargy and r>L LE erythemia. In ER found to be hypotensive.On PCCM evaluation the patient's Glasgow Coma Scale was 6, he was barely arousable to noxious stimuli. He was intubated for airway protection. Admitted with a working diagnosis of sepsis source felt to be lower extremity cellulitis +/- aspiration pneumonia.  CONSULTATIONS:   pulmonary/intensive care  PERTINENT RADIOLOGIC STUDIES: Ct Head Wo Contrast  12/31/2013   CLINICAL DATA:  Lethargy.  EXAM: CT HEAD WITHOUT CONTRAST  TECHNIQUE: Contiguous axial images were obtained from the base of  the skull through the vertex without intravenous contrast.  COMPARISON:  None.  FINDINGS: No mass. No hydrocephalus. No hemorrhage. Stable lucencies in the left lenticular nucleus noted. These could represent tiny areas of bold lacunar infarction. These could represent prominent peri vascular spaces. 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 These appear stable. No acute bony abnormality. Mild mucosal thickening ethmoid and maxillary sinuses. Mastoids are clear.  IMPRESSION: Chronic ischemic change, no acute abnormality.   Electronically Signed   By: Maisie Fus  Register   On: 12/31/2013 02:26   Dg Chest Port 1 View  01/01/2014   CLINICAL DATA:  Evaluate for pneumonia.  Recent extubation.  EXAM: PORTABLE CHEST - 1 VIEW  COMPARISON:  DG CHEST 1V PORT dated 12/31/2013  FINDINGS: The endotracheal tube and nasogastric tube have been removed. Lung markings are mildly prominent but no evidence for edema or airspace disease. Heart size is within normal limits. Atherosclerotic calcifications at the aortic arch. Deformity of the left clavicle suggests an old injury. No acute bone abnormality.  IMPRESSION: Removal of support apparatuses as described.  No focal chest disease.   Electronically Signed   By: Richarda Overlie M.D.   On: 01/01/2014 07:52   Dg Chest Portable 1 View  12/31/2013   CLINICAL DATA:  Intubation.  EXAM: PORTABLE CHEST - 1 VIEW  COMPARISON:  Single view of the chest 12/30/2013.  FINDINGS: A new endotracheal tube is in place with the tip in good position at the level of the clavicular heads. NG tube courses into the stomach and below the inferior margin of the film. Lungs appear clear with low lung volumes noted. Heart size is enlarged.  IMPRESSION: ET tube and NG tube in good position.  No acute finding.   Electronically Signed   By: Drusilla Kanner M.D.   On: 12/31/2013 03:21   Dg Chest Port 1 View  12/30/2013   CLINICAL DATA:  Fever and wheezing.  EXAM: PORTABLE CHEST - 1 VIEW   COMPARISON:  Single view of the chest 07/10/2013.  FINDINGS: Heart size and mediastinal contours are within normal limits. Both lungs are clear. Visualized skeletal structures are unremarkable.  IMPRESSION: Negative exam.   Electronically Signed   By: Drusilla Kanner M.D.   On: 12/30/2013 23:46     PERTINENT LAB RESULTS: CBC:  Recent Labs  01/04/14 0916 01/06/14 0803  WBC 4.4 7.3  HGB 8.5* 9.6*  HCT 26.5* 30.3*  PLT 66* 105*   CMET CMP     Component Value Date/Time   NA 141 01/05/2014 0824   K 4.2 01/05/2014  0824   CL 105 01/05/2014 0824   CO2 25 01/05/2014 0824   GLUCOSE 102* 01/05/2014 0824   BUN 13 01/05/2014 0824   CREATININE 0.91 01/05/2014 0824   CREATININE 0.93 04/24/2011 1656   CALCIUM 8.6 01/05/2014 0824   PROT 7.0 01/05/2014 0824   ALBUMIN 2.8* 01/05/2014 0824   AST 51* 01/05/2014 0824   ALT 43 01/05/2014 0824   ALKPHOS 19* 01/05/2014 0824   BILITOT 1.1 01/05/2014 0824   GFRNONAA 88* 01/05/2014 0824   GFRAA >90 01/05/2014 0824    GFR Estimated Creatinine Clearance: 88.4 ml/min (by C-G formula based on Cr of 0.91). No results found for this basename: LIPASE, AMYLASE,  in the last 72 hours No results found for this basename: CKTOTAL, CKMB, CKMBINDEX, TROPONINI,  in the last 72 hours No components found with this basename: POCBNP,  No results found for this basename: DDIMER,  in the last 72 hours No results found for this basename: HGBA1C,  in the last 72 hours No results found for this basename: CHOL, HDL, LDLCALC, TRIG, CHOLHDL, LDLDIRECT,  in the last 72 hours No results found for this basename: TSH, T4TOTAL, FREET3, T3FREE, THYROIDAB,  in the last 72 hours No results found for this basename: VITAMINB12, FOLATE, FERRITIN, TIBC, IRON, RETICCTPCT,  in the last 72 hours Coags:  Recent Labs  01/05/14 0433  INR 1.51*   Microbiology: Recent Results (from the past 240 hour(s))  CULTURE, BLOOD (ROUTINE X 2)     Status: None   Collection Time    12/30/13 11:29 PM       Result Value Ref Range Status   Specimen Description BLOOD RIGHT FOREARM   Final   Special Requests BOTTLES DRAWN AEROBIC AND ANAEROBIC 10CC EACH   Final   Culture  Setup Time     Final   Value: 12/31/2013 08:13     Performed at [facility]   Culture     Final   Value: NO GROWTH 5 DAYS     Performed at [facility]   Report Status 01/06/2014 FINAL   Final  CULTURE, BLOOD (ROUTINE X 2)     Status: None   Collection Time    12/30/13 11:38 PM      Result Value Ref Range Status   Specimen Description BLOOD LEFT HAND   Final   Special Requests BOTTLES DRAWN AEROBIC ONLY [facility]   Final   Culture  Setup Time     Final   Value: 12/31/2013 08:13     Performed at [facility]   Culture     Final   Value: NO GROWTH 5 DAYS     Performed at [facility]   Report Status 01/06/2014 FINAL   Final  URINE CULTURE     Status: None   Collection Time    12/30/13 11:45 PM      Result Value Ref Range Status   Specimen Description URINE, CATHETERIZED   Final   Special Requests Immunocompromised   Final   Culture  Setup Time     Final   Value: 12/31/2013 04:52     Performed at [facility] Count     Final   Value: NO GROWTH     Performed at [facility]   Culture     Final   Value: NO GROWTH     Performed at [facility]   Report Status 01/01/2014 FINAL   Final  MRSA PCR  SCREENING     Status: None   Collection Time    12/31/13  4:33 AM      Result Value Ref Range Status   MRSA by PCR NEGATIVE  NEGATIVE Final   Comment:            The GeneXpert MRSA Assay (FDA     approved for NASAL specimens     only), is one component of a     comprehensive MRSA colonization     surveillance program. It is not     intended to diagnose MRSA     infection nor to guide or     monitor treatment for     MRSA infections.     BRIEF HOSPITAL COURSE:   Principal Problem: Sepsis  -Likely secondary to right lower extremity cellulitis.  - Patient  on initial presentation to the emergency room was hypotensive in spite of IV fluid resuscitation, was admitted by PCCM to the intensive care unit, diuretics were held. Patient's blood pressure normalized. He was also empirically started on vancomycin and Primaxin, with quick clinical improvement, Primaxin was discontinued, patient was continued on vancomycin for right lower extremity cellulitis. On exam today, he no longer has any more cellulitis, we will transition him to doxycycline for a few more days on discharge. -Afebrile, WBC WNL 3/23, No abdominal pain no suspicion for SBP.  - Note-cultures on 3/18 negative.  Active Problems:   Acute encephalopathy -Likely hepatic encephalopath d/t hx of cirrhosis, with contribution from chronic narcotics and benzodiazepine use. - Head ct negative.  - Hyperammonemia resolved 3/23  - Currently slow, but awake and mostly alert. Able to answer simple questions yes and no. Is able to follow all commands. - Continue with lactulose and rifaximin on discharge. Narcotics and benzodiazepines were on hold for the past 2 days, I have resumed them on a prn basis only. Will need close following of his mental status was at rehabilitation.  Acute respiratory failure  -Patient was intubated for airway protection, self extubated on 3/20.  -Respiratory status is stable   Left lower extremity cellulitis  -Redness, tenderness and swelling, significantly improved and no longer with any cellulitic area on exam. - patient was on IV vancomycin since 3/19, will be transitioned to doxycycline for a few more days.  Hepatic cirrhosis  -With abdominal ascites, hypoalbuminemia, hyperammonemia, thrombocytopenia and anemia.  -Patient already on lactulose, Xifaxan added.  - Volume status seems to be significantly improved, no edema on exam today. Was on IV Lasix 40 mg twice a day for the past 2 days, with transitioned to oral Lasix on discharge. Continue with Aldactone on  discharge. - Will need followup with both primary care practitioner and gastroenterology on discharge.  Anemia/thrombocytopenia - Likely chronic issues from hepatic cirrhosis, may have worsened due to acute illness. Continue to monitor CBC periodically.   TODAY-DAY OF DISCHARGE:  Subjective:   Alex Henry today is awake, follows simple commands, answers simple questions appropriately. Is moving all 4 extremities.  Objective:   Blood pressure 146/78, pulse 79, temperature 97.5 F (36.4 C), temperature source Oral, resp. rate 18, height 5\' 8"  (1.727 m), weight 85.3 kg (188 lb 0.8 oz), SpO2 94.00%.  Intake/Output Summary (Last 24 hours) at 01/06/14 1035 Last data filed at 01/06/14 0445  Gross per 24 hour  Intake   2250 ml  Output    401 ml  Net   1849 ml   Filed Weights   01/04/14 0538 01/05/14 0533 01/06/14 0436  Weight: 91 kg (200 lb 9.9 oz) 87.3 kg (192 lb 7.4 oz) 85.3 kg (188 lb 0.8 oz)    Exam Awake Alert, Oriented *3, No new F.N deficits, Normal affect [place].AT,PERRAL Supple Neck,No JVD, No cervical lymphadenopathy appriciated.  Symmetrical Chest wall movement, Good air movement bilaterally, CTAB RRR,No Gallops,Rubs or new Murmurs, No Parasternal Heave +ve B.Sounds, Abd Soft, Non tender, No organomegaly appriciated, No rebound -guarding or rigidity. No Cyanosis, Clubbing or edema, No new Rash or bruise  DISCHARGE CONDITION: Stable  DISPOSITION: SNF  DISCHARGE INSTRUCTIONS:    Activity:  As tolerated with Full fall precautions use walker/cane & assistance as needed  Diet recommendation: Heart Healthy diet      Discharge Orders   Future Orders Complete By Expires   Call MD for:  extreme fatigue  As directed    Call MD for:  persistant dizziness or light-headedness  As directed    Diet - low sodium heart healthy  As directed    Increase activity slowly  As directed       Follow-up Information   Follow up with HAGUE, Myrene Galas, MD. Schedule an appointment as  soon as possible for a visit in 1 week.   Specialty:  Internal Medicine   Contact information:   [address] [place] [address] [phone]         Total Time spent on discharge equals 45 minutes.  SignedJeoffrey Massed 01/06/2014 10:35 AM

## 2014-01-07 ENCOUNTER — Other Ambulatory Visit: Payer: Self-pay | Admitting: *Deleted

## 2014-01-07 MED ORDER — OXYCODONE HCL 5 MG PO TABS: ORAL_TABLET | ORAL | Status: AC

## 2014-01-07 MED ORDER — ALPRAZOLAM 0.5 MG PO TABS: ORAL_TABLET | ORAL | Status: DC

## 2014-01-07 NOTE — Telephone Encounter (Signed)
[facility] [place] 

## 2014-01-08 ENCOUNTER — Non-Acute Institutional Stay (SKILLED_NURSING_FACILITY): Payer: Medicaid Other | Admitting: Internal Medicine

## 2014-01-08 DIAGNOSIS — L03119 Cellulitis of unspecified part of limb: Secondary | ICD-10-CM

## 2014-01-08 DIAGNOSIS — G9340 Encephalopathy, unspecified: Secondary | ICD-10-CM

## 2014-01-08 DIAGNOSIS — L02419 Cutaneous abscess of limb, unspecified: Secondary | ICD-10-CM

## 2014-01-08 DIAGNOSIS — R531 Weakness: Secondary | ICD-10-CM

## 2014-01-08 DIAGNOSIS — L03115 Cellulitis of right lower limb: Secondary | ICD-10-CM

## 2014-01-08 DIAGNOSIS — R5381 Other malaise: Secondary | ICD-10-CM

## 2014-01-08 DIAGNOSIS — K219 Gastro-esophageal reflux disease without esophagitis: Secondary | ICD-10-CM

## 2014-01-08 DIAGNOSIS — K7460 Unspecified cirrhosis of liver: Secondary | ICD-10-CM

## 2014-01-08 DIAGNOSIS — D638 Anemia in other chronic diseases classified elsewhere: Secondary | ICD-10-CM

## 2014-01-08 DIAGNOSIS — R5383 Other fatigue: Secondary | ICD-10-CM

## 2014-01-08 NOTE — Progress Notes (Signed)
Patient ID: Alex Henry, male   DOB: 12/20/1950, 63 y.o.   MRN: [id number]     Alex Henry living [place]   PCP: Galvin Proffer, MD  Code Status: full code  Allergies  Allergen Reactions  . Ceftriaxone Sodium     REACTION: RASH, severe  . Codeine Rash    Chief Complaint: new admit  HPI:  63 y/o male patient is here for STR after hospital admission from 12/30/13- 01/04/14 with acute encephalopathy, hypotension and fever. He was found to have bilateral lower extremity cellulitis. He has history of hep c with liver cirrhosis. He required intubation and had workup for sepsis in setting of cellulitis and possible aspiration pneumonia. He as started on antibiotics, weaned off ventilation and extubated. His cellulitis improved. With generalized weakness and need for rehabilitation, he is here in the SNF. Goal is to return home. He is seen in his room and denies any complaints. He is in no distress. He is working with therapy team.  Review of Systems:  Constitutional: Negative for fever, chills, weight loss, malaise/fatigue and diaphoresis.  HENT: Negative for congestion, hearing loss and sore throat.   Eyes: Negative for eye pain, blurred vision, double vision and discharge.  Respiratory: Negative for cough, sputum production, shortness of breath and wheezing.   Cardiovascular: Negative for chest pain, palpitations, orthopnea and leg swelling.  Gastrointestinal: Negative for heartburn, nausea, vomiting, abdominal pain, diarrhea and constipation.  Genitourinary: Negative for dysuria, urgency, frequency, hematuria and flank pain.  Musculoskeletal: Negative for back pain, falls, joint pain and myalgias.  Skin: Negative for itching and rash.  Neurological: Negative for weakness,dizziness, tingling, focal weakness and headaches.  Psychiatric/Behavioral: Negative for depression and memory loss. The patient is not nervous/anxious.     Past Medical History  Diagnosis Date  . Bacteremia 02/2010    GBS bacteremia - tx with Abx x 28 days  . Septic arthritis     sternoclavicular  . Hepatitis C DX: 03/2010    VL (03/2010) 5,940,000  . Depression   . Anxiety   . History of Clostridium difficile infection   . Discitis   . Epidural abscess     H/O   . Elevated blood pressure   . Shoulder pain, bilateral   . Anemia   . Hepatic cirrhosis   . Hepatic encephalopathy 03/2010  . Allergic rhinitis     chronic  . Drug-induced constipation   . Osteomyelitis 05/2010, 07/2010    Diskitis and osteomyelitis at C6-C7   . Thrombocytopenia   . Alcohol abuse   . Atypical chest pain     Seen at [facility] (11/2010) - thought to be 2/2 GERD. Lexiscan Stress test (12/05/10) - at [facility]. No ischemia seen, normal EF of 65%  . COPD (chronic obstructive pulmonary disease)   . Hypertension    Past Surgical History  Procedure Laterality Date  . Esophagogastroduodenoscopy N/A 03/26/2013    Procedure: ESOPHAGOGASTRODUODENOSCOPY (EGD);  Surgeon: Petra Kuba, MD;  Location: [facility] ENDOSCOPY;  Service: Endoscopy;  Laterality: N/A;   Social History:   reports that he quit smoking about 10 years ago. His smokeless tobacco use includes Snuff. He reports that he does not drink alcohol or use illicit drugs.  Family History  Problem Relation Age of Onset  . Heart disease Father   . Heart attack Father   . Cancer Maternal Aunt 64    colon cancer  . Arthritis Mother   . Arthritis Sister   . Anxiety disorder Sister   .  Arthritis Sister   . Anxiety disorder Sister   . Cirrhosis Brother     and hepatitis c  . Cirrhosis Brother     and hepatitis c    Medications: Patient's Medications  New Prescriptions   No medications on file  Previous Medications   ALPRAZOLAM (XANAX) 0.5 MG TABLET    Take one tablet by mouth every 8 hours as needed for anxiety   DOXYCYCLINE (VIBRA-TABS) 100 MG TABLET    Take 1 tablet (100 mg total) by mouth every 12 (twelve) hours. 3 days from 01/06/14 and then stop     FUROSEMIDE (LASIX) 40 MG TABLET    Take 1 tablet (40 mg total) by mouth daily.   LACTULOSE (CHRONULAC) 10 GM/15ML SOLUTION    Take 45 mLs (30 g total) by mouth every 6 (six) hours.   NICOTINE (NICODERM CQ - DOSED IN MG/24 HOURS) 14 MG/24HR PATCH    Place 1 patch (14 mg total) onto the skin daily.   OXYCODONE (ROXICODONE) 5 MG IMMEDIATE RELEASE TABLET    Take one tablet by mouth every 4 hours as needed for pain   OXYCODONE HCL 10 MG TABS    Take 0.5 tablets (5 mg total) by mouth 4 (four) times daily as needed.   PANTOPRAZOLE (PROTONIX) 40 MG TABLET    Take 1 tablet (40 mg total) by mouth daily.   RIFAXIMIN (XIFAXAN) 550 MG TABS TABLET    Take 1 tablet (550 mg total) by mouth 2 (two) times daily.   SPIRONOLACTONE (ALDACTONE) 100 MG TABLET    Take 1 tablet (100 mg total) by mouth daily.  Modified Medications   No medications on file  Discontinued Medications   No medications on file     Physical Exam:   Filed Vitals:   01/08/14 0925  BP: 122/76  Pulse: 87  Temp: 98 F (36.7 C)  Resp: 18  Height: 5\' 8"  (1.727 m)  Weight: 180 lb (81.647 kg)  SpO2: 95%    General- elderly male in no acute distress Head- atraumatic, normocephalic Eyes- PERRLA, EOMI, no pallor, no icterus, no discharge Neck- no lymphadenopathy Cardiovascular- normal s1,s2, no murmurs/ rubs/ gallops Respiratory- bilateral clear to auscultation, no wheeze, no rhonchi, no crackles, no use of accessory muscles Abdomen- bowel sounds present, soft, non tender Musculoskeletal- able to move all 4 extremities, no spinal and paraspinal tenderness, no leg edema, mild erythema on lower legs, tattoo in left leg, normal temperature Neurological- no focal deficit Skin- warm and dry, mild erythema in both lower extremity Psychiatry- alert and oriented to person, place and time, normal mood and affect  Labs reviewed: Basic Metabolic Panel:  Recent Labs  13/08/65 0626 01/04/14 0410 01/05/14 0824  NA 140 138 141  K 4.2 3.4*  4.2  CL 109 102 105  CO2 21 23 25   GLUCOSE 112* 115* 102*  BUN 8 9 13   CREATININE 0.99 0.83 0.91  CALCIUM 7.9* 8.6 8.6   Liver Function Tests:  Recent Labs  12/30/13 2329 01/04/14 0410 01/05/14 0824  AST 82* 74* 51*  ALT 49 52 43  ALKPHOS 22* 21* 19*  BILITOT 1.2 1.3* 1.1  PROT 6.9 7.2 7.0  ALBUMIN 2.8* 2.9* 2.8*   No results found for this basename: LIPASE, AMYLASE,  in the last 8760 hours  Recent Labs  03/28/13 0350 12/30/13 2351 01/04/14 0410  AMMONIA 39 70* 26   CBC:  Recent Labs  12/30/13 2329 01/04/14 0410 01/04/14 0916 01/06/14 0803  WBC 6.1 5.3  4.4 7.3  NEUTROABS 3.9  --   --  5.6  HGB 7.9* 8.4* 8.5* 9.6*  HCT 24.9* 26.6* 26.5* 30.3*  MCV 83.8 84.4 84.1 83.9  PLT 54* 59* 66* 105*   Cardiac Enzymes: No results found for this basename: CKTOTAL, CKMB, CKMBINDEX, TROPONINI,  in the last 8760 hours BNP: No components found with this basename: POCBNP,  CBG:  Recent Labs  12/31/13 0821 12/31/13 1205 12/31/13 1610  GLUCAP 98 92 82    Radiological Exams: Ct Head Wo Contrast  12/31/2013   CLINICAL DATA:  Lethargy.  EXAM: CT HEAD WITHOUT CONTRAST  TECHNIQUE: Contiguous axial images were obtained from the base of the skull through the vertex without intravenous contrast.  COMPARISON:  None.  FINDINGS: No mass. No hydrocephalus. No hemorrhage. Stable lucencies in the left lenticular nucleus noted. These could represent tiny areas of bold lacunar infarction. These could represent prominent peri vascular spaces. 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 These appear stable. No acute bony abnormality. Mild mucosal thickening ethmoid and maxillary sinuses. Mastoids are clear.  IMPRESSION: Chronic ischemic change, no acute abnormality.   Electronically Signed   By: Maisie Fushomas  Register   On: 12/31/2013 02:26   Dg Chest Port 1 View  01/01/2014   CLINICAL DATA:  Evaluate for pneumonia.  Recent extubation.  EXAM: PORTABLE CHEST - 1 VIEW  COMPARISON:   DG CHEST 1V PORT dated 12/31/2013  FINDINGS: The endotracheal tube and nasogastric tube have been removed. Lung markings are mildly prominent but no evidence for edema or airspace disease. Heart size is within normal limits. Atherosclerotic calcifications at the aortic arch. Deformity of the left clavicle suggests an old injury. No acute bone abnormality.  IMPRESSION: Removal of support apparatuses as described.  No focal chest disease.   Electronically Signed   By: Richarda OverlieAdam  Henn M.D.   On: 01/01/2014 07:52   Dg Chest Portable 1 View  12/31/2013   CLINICAL DATA:  Intubation.  EXAM: PORTABLE CHEST - 1 VIEW  COMPARISON:  Single view of the chest 12/30/2013.  FINDINGS: A new endotracheal tube is in place with the tip in good position at the level of the clavicular heads. NG tube courses into the stomach and below the inferior margin of the film. Lungs appear clear with low lung volumes noted. Heart size is enlarged.  IMPRESSION: ET tube and NG tube in good position.  No acute finding.   Electronically Signed   By: Drusilla Kannerhomas  Dalessio M.D.   On: 12/31/2013 03:21   Dg Chest Port 1 View  12/30/2013   CLINICAL DATA:  Fever and wheezing.  EXAM: PORTABLE CHEST - 1 VIEW  COMPARISON:  Single view of the chest 07/10/2013.  FINDINGS: Heart size and mediastinal contours are within normal limits. Both lungs are clear. Visualized skeletal structures are unremarkable.  IMPRESSION: Negative exam.   Electronically Signed   By: Drusilla Kannerhomas  Dalessio M.D.   On: 12/30/2013 23:46    Assessment/Plan  Generalized weakness Here for STR. Will have him work with PT and OT for strengthening exercises. Fall precautions  Cellulitis Continue doxycycline until 01/09/14. Monitor wbc, temp curve and skin changes. Resolving at present  Hep c with cirrhosis Avoid narcotics and benzos for now. Will need outpatient gi follow up. Continue rifaxamin and lactulose. Continue spironolactone and lasix for now. Monitor bp  Acute encephalopathy Complete  antibiotics course. Monitor for signs of infection. Avoid sedatives and hypnotics. Continue rifaxamin and lactulose  anemia In setting of hep c  with cirrhosis. Monitor cbc  gerd Continue protonix for now   Family/ staff Communication: reviewed care plan with patient and nursing supervisor  Goals of care: STR   Labs/tests ordered- cbc, cmp    Oneal Grout, MD  [facility] [phone] (Monday-Friday 8 am - 5 pm) [phone] (afterhours)

## 2014-01-18 ENCOUNTER — Non-Acute Institutional Stay (SKILLED_NURSING_FACILITY): Payer: Medicaid Other | Admitting: Internal Medicine

## 2014-01-18 DIAGNOSIS — D638 Anemia in other chronic diseases classified elsewhere: Secondary | ICD-10-CM

## 2014-01-18 NOTE — Progress Notes (Signed)
Patient ID: Alex Henry, male   DOB: 12/16/1950, 63 y.o.   MRN: [id number]    [name] Complaint  Patient presents with  . Acute Visit    low hb/hct    Allergies  Allergen Reactions  . Ceftriaxone Sodium     REACTION: RASH, severe  . Codeine Rash    HPI 63 y/o male patient is here for STR. He has history of hep c cirrhosis and is seen today with low hb/hct. He had signed himself out of the facility for the weekend and is back today. He feels tired but is able to walk around with therapy without any assistive device.He is in no distress. He is working with therapy team.  Review of Systems Constitutional: Negative for fever, chills, weight loss, HENT: Negative for congestion, hearing loss and sore throat.   Eyes: Negative for eye pain, blurred vision, double vision and discharge.  Respiratory: Negative for cough, sputum production, shortness of breath and wheezing.   Cardiovascular: Negative for chest pain, palpitations Gastrointestinal: Negative for heartburn, nausea, vomiting, abdominal pain Skin: Negative for itching and rash.  Neurological: Negative for dizziness Psychiatric/Behavioral: Negative for depression and memory loss. The patient is not nervous/anxious.    Past Medical History  Diagnosis Date  . Bacteremia 02/2010    GBS bacteremia - tx with Abx x 28 days  . Septic arthritis     sternoclavicular  . Hepatitis C DX: 03/2010    VL (03/2010) 5,940,000  . Depression   . Anxiety   . History of Clostridium difficile infection   . Discitis   . Epidural abscess     H/O   . Elevated blood pressure   . Shoulder pain, bilateral   . Anemia   . Hepatic cirrhosis   . Hepatic encephalopathy 03/2010  . Allergic rhinitis     chronic  . Drug-induced constipation   . Osteomyelitis 05/2010, 07/2010    Diskitis and osteomyelitis at C6-C7   . Thrombocytopenia   . Alcohol abuse   . Atypical chest pain     [facility]) - thought  to be 2/2 GERD. Lexiscan Stress test (12/05/10) [facility]. No ischemia seen, normal EF of 65%  . COPD (chronic obstructive pulmonary disease)   . Hypertension    Past Surgical History  Procedure Laterality Date  . Esophagogastroduodenoscopy N/A 03/26/2013    Procedure: ESOPHAGOGASTRODUODENOSCOPY (EGD);  Surgeon: Petra KubaMarc E Magod, MD;  [facility];  Service: Endoscopy;  Laterality: N/A;   Med[facility]. [facility]  Physical exam  Vital signs are stable. Afebrile  gen- elderly frail make in no distress HEENT- no pallor, no icterus, no LAD, MMM cvs- normal s1,s2, rrr Lungs- CTAB Abdomen- soft, non tender, no guarding or rigidity  Labs- Lab Results  Component Value Date   WBC 7.3 01/06/2014   HGB 9.6* 01/06/2014   HCT 30.3* 01/06/2014   MCV 83.9 01/06/2014   PLT 105* 01/06/2014   01/13/14 wbc 3.9, hb 8.7, hcvt 26.6, plt 111, mcv 78.2  Assessment/plan  Anemia- has slow decline of hb/hct. He has hx of hep c with cirrhosis and this likely is contributing to this and causing anemia of chronic disease.  Also will start him on ferrous sulfate 325 mg bid for now. Recheck cbc with ferritin level in 2 weeks

## 2014-01-26 ENCOUNTER — Non-Acute Institutional Stay (SKILLED_NURSING_FACILITY): Payer: Medicaid Other | Admitting: Internal Medicine

## 2014-01-26 DIAGNOSIS — R531 Weakness: Secondary | ICD-10-CM

## 2014-01-26 DIAGNOSIS — L03119 Cellulitis of unspecified part of limb: Secondary | ICD-10-CM

## 2014-01-26 DIAGNOSIS — K219 Gastro-esophageal reflux disease without esophagitis: Secondary | ICD-10-CM

## 2014-01-26 DIAGNOSIS — R5383 Other fatigue: Secondary | ICD-10-CM

## 2014-01-26 DIAGNOSIS — L03115 Cellulitis of right lower limb: Secondary | ICD-10-CM

## 2014-01-26 DIAGNOSIS — L02419 Cutaneous abscess of limb, unspecified: Secondary | ICD-10-CM

## 2014-01-26 DIAGNOSIS — R5381 Other malaise: Secondary | ICD-10-CM

## 2014-01-26 DIAGNOSIS — G9340 Encephalopathy, unspecified: Secondary | ICD-10-CM

## 2014-01-26 DIAGNOSIS — R188 Other ascites: Secondary | ICD-10-CM

## 2014-01-26 DIAGNOSIS — K7460 Unspecified cirrhosis of liver: Secondary | ICD-10-CM

## 2014-01-26 NOTE — Progress Notes (Signed)
Patient ID: Alex Henry, male   DOB: 11/18/1950, 63 y.o.   MRN: [id number]  [facility] Rayn Shorb L. Renato Gailseed, D.O., C.M.D.  PCP: Galvin ProfferHAGUE, IMRAN P, MD   Allergies  Allergen Reactions  . Ceftriaxone Sodium     REACTION: RASH, severe  . Codeine Rash    Chief Complaint  Patient presents with  . Discharge Note    home    HPI:  63 yo male here for short term rehab after hospitalization for acute encephalopathy, hypotension and fever. He was found to have bilateral lower extremity cellulitis. He has history of hep c with liver cirrhosis. He required intubation and had workup for sepsis in setting of cellulitis and possible aspiration pneumonia. He as started on antibiotics, weaned off ventilation and extubated. His cellulitis improved.  He underwent PT, OT, ST here and is now ready to go home.     Review of Systems:  Review of Systems  Constitutional: Negative for fever and malaise/fatigue.  Respiratory: Negative for cough and shortness of breath.   Cardiovascular: Negative for chest pain.  Gastrointestinal: Negative for abdominal pain.  Genitourinary: Negative for dysuria.  Musculoskeletal: Negative for falls.  Neurological: Negative for dizziness and weakness.  Psychiatric/Behavioral: Negative for memory loss.     Past Medical History  Diagnosis Date  . Bacteremia 02/2010    GBS bacteremia - tx with Abx x 28 days  . Septic arthritis     sternoclavicular  . Hepatitis C DX: 03/2010    VL (03/2010) 5,940,000  . Depression   . Anxiety   . History of Clostridium difficile infection   . Discitis   . Epidural abscess     H/O   . Elevated blood pressure   . Shoulder pain, bilateral   . Anemia   . Hepatic cirrhosis   . Hepatic encephalopathy 03/2010  . Allergic rhinitis     chronic  . Drug-induced constipation   . Osteomyelitis 05/2010, 07/2010    Diskitis and osteomyelitis at C6-C7   . Thrombocytopenia   . Alcohol abuse   . Atypical chest pain       [facility] - thought to be 2/2 GERD. Lexiscan Stress test (12/05/10) [facility]. No ischemia seen, normal EF of 65%  . COPD (chronic obstructive pulmonary disease)   . Hypertension     Past Surgical History  Procedure Laterality Date  . Esophagogastroduodenoscopy N/A 03/26/2013    Procedure: ESOPHAGOGASTRODUODENOSCOPY (EGD);  Surgeon: Petra KubaMarc E Magod, MD;  [facility];  Service: Endoscopy;  Laterality: N/A;    Social History:   reports that he quit smoking about 10 years ago. His smokeless tobacco use includes Snuff. He reports that he does not drink alcohol or use illicit drugs.  Family History  Problem Relation Age of Onset  . Heart disease Father   . Heart attack Father   . Cancer Maternal Aunt [id number]    colon cancer  . Arthritis Mother   . Arthritis Sister   . Anxiety disorder Sister   . Arthritis Sister   . Anxiety disorder Sister   . Cirrhosis Brother     and hepatitis c  . Cirrhosis Brother     and hepatitis c    Medications: Patient's Medications  New Prescriptions   No medications on file  Previous Medications   DOXYCYCLINE (VIBRA-TABS) 100 MG TABLET    Take 1 tablet (100 mg total) by mouth every 12 (twelve) hours. 3 days  from 01/06/14 and then stop   FUROSEMIDE (LASIX) 40 MG TABLET    Take 1 tablet (40 mg total) by mouth daily.   LACTULOSE (CHRONULAC) 10 GM/15ML SOLUTION    Take 45 mLs (30 g total) by mouth every 6 (six) hours.   NICOTINE (NICODERM CQ - DOSED IN MG/24 HOURS) 14 MG/24HR PATCH    Place 1 patch (14 mg total) onto the skin daily.   OXYCODONE (ROXICODONE) 5 MG IMMEDIATE RELEASE TABLET    Take one tablet by mouth every 4 hours as needed for pain   PANTOPRAZOLE (PROTONIX) 40 MG TABLET    Take 1 tablet (40 mg total) by mouth daily.   RIFAXIMIN (XIFAXAN) 550 MG TABS TABLET    Take 1 tablet (550 mg total) by mouth 2 (two) times daily.   SPIRONOLACTONE (ALDACTONE) 100 MG TABLET    Take 1 tablet (100 mg total) by  mouth daily.  Modified Medications   Modified Medication Previous Medication   ALPRAZOLAM (XANAX) 0.5 MG TABLET ALPRAZolam (XANAX) 0.5 MG tablet      Take one tablet by mouth three times daily as needed for anxiety    Take one tablet by mouth every 8 hours as needed for anxiety  Discontinued Medications   OXYCODONE HCL 10 MG TABS    Take 0.5 tablets (5 mg total) by mouth 4 (four) times daily as needed.    Physical Exam: Filed Vitals:   01/26/14 1414  BP: 111/69  Pulse: 86  Temp: 98.4 F (36.9 C)  Resp: 20  Height: 5\' 8"  (1.727 m)  Weight: 190 lb (86.183 kg)   Physical Exam  Constitutional: No distress.  Cardiovascular: Normal rate, regular rhythm, normal heart sounds and intact distal pulses.   Pulmonary/Chest: Effort normal and breath sounds normal.  Abdominal: Soft. Bowel sounds are normal.  Musculoskeletal: Normal range of motion.  Neurological: He is alert.  Skin: Skin is warm and dry.    Labs reviewed: Basic Metabolic Panel:  Recent Labs  56/21/3003/21/15 0626 01/04/14 0410 01/05/14 0824  NA 140 138 141  K 4.2 3.4* 4.2  CL 109 102 105  CO2 21 23 25   GLUCOSE 112* 115* 102*  BUN 8 9 13   CREATININE 0.99 0.83 0.91  CALCIUM 7.9* 8.6 8.6   Liver Function Tests:  Recent Labs  12/30/13 2329 01/04/14 0410 01/05/14 0824  AST 82* 74* 51*  ALT 49 52 43  ALKPHOS 22* 21* 19*  BILITOT 1.2 1.3* 1.1  PROT 6.9 7.2 7.0  ALBUMIN 2.8* 2.9* 2.8*   No results found for this basename: LIPASE, AMYLASE,  in the last 8760 hours  Recent Labs  12/30/13 2351 01/04/14 0410  AMMONIA 70* 26   CBC:  Recent Labs  12/30/13 2329 01/04/14 0410 01/04/14 0916 01/06/14 0803  WBC 6.1 5.3 4.4 7.3  NEUTROABS 3.9  --   --  5.6  HGB 7.9* 8.4* 8.5* 9.6*  HCT 24.9* 26.6* 26.5* 30.3*  MCV 83.8 84.4 84.1 83.9  PLT 54* 59* 66* 105*   CBG:  Recent Labs  12/31/13 0821 12/31/13 1205 12/31/13 1610  GLUCAP 98 92 82     Assessment/Plan:   1. Generalized weakness -has improved,  ready for d/c home--does not require additional therapy at home  2. Acute encephalopathy -resolved   3. Cirrhosis of liver with ascites, unspecified hepatic cirrhosis type -stable  4. Cellulitis of leg, right -resolved  5. Gastroesophageal reflux disease, esophagitis presence not specified -stable with current treatment, also needs these meds  due to his cirrhosis and risk of gi bleed   Patient is being discharged with home health services:  None needed  Patient is being discharged with the following durable medical equipment:  none  Patient has been advised to f/u with their PCP in 1-2 weeks to bring them up to date on their rehab stay.  They were provided with a 30 day supply of scripts for prescription medications and refills must be obtained from their PCP.  Given only 30 xanax, sixty oxycodone 5mg  tabs, 15 temazepam for sleep (could use xanax anyway)

## 2014-01-27 ENCOUNTER — Other Ambulatory Visit: Payer: Self-pay | Admitting: *Deleted

## 2014-01-27 MED ORDER — ALPRAZOLAM 0.5 MG PO TABS: ORAL_TABLET | ORAL | Status: AC

## 2014-01-27 NOTE — Telephone Encounter (Signed)
[facility] [place] 

## 2014-05-15 DEATH — deceased

## 2014-07-31 ENCOUNTER — Encounter: Payer: Self-pay | Admitting: Internal Medicine
